# Patient Record
Sex: Female | Born: 1960
Health system: Southern US, Community
[De-identification: ages and names within clinical notes are randomized; demographics above are authoritative.]

## PROBLEM LIST (undated history)

## (undated) DIAGNOSIS — E7800 Pure hypercholesterolemia, unspecified: Secondary | ICD-10-CM

---

## 2003-05-23 ENCOUNTER — Other Ambulatory Visit: Admission: RE | Admit: 2003-05-23 | Discharge: 2003-05-23 | Payer: Self-pay | Admitting: Family Medicine

## 2005-08-11 ENCOUNTER — Other Ambulatory Visit: Admission: RE | Admit: 2005-08-11 | Discharge: 2005-08-11 | Payer: Self-pay | Admitting: Family Medicine

## 2006-04-22 ENCOUNTER — Ambulatory Visit (HOSPITAL_COMMUNITY): Admission: RE | Admit: 2006-04-22 | Discharge: 2006-04-22 | Payer: Self-pay | Admitting: Family Medicine

## 2006-05-05 ENCOUNTER — Encounter: Admission: RE | Admit: 2006-05-05 | Discharge: 2006-05-05 | Payer: Self-pay | Admitting: Family Medicine

## 2006-05-21 ENCOUNTER — Other Ambulatory Visit: Admission: RE | Admit: 2006-05-21 | Discharge: 2006-05-21 | Payer: Self-pay | Admitting: Family Medicine

## 2006-08-20 ENCOUNTER — Ambulatory Visit (HOSPITAL_COMMUNITY): Admission: RE | Admit: 2006-08-20 | Discharge: 2006-08-20 | Payer: Self-pay | Admitting: Endocrinology

## 2006-11-22 ENCOUNTER — Other Ambulatory Visit: Admission: RE | Admit: 2006-11-22 | Discharge: 2006-11-22 | Payer: Self-pay | Admitting: Family Medicine

## 2007-05-06 ENCOUNTER — Other Ambulatory Visit: Admission: RE | Admit: 2007-05-06 | Discharge: 2007-05-06 | Payer: Self-pay | Admitting: Family Medicine

## 2007-06-08 ENCOUNTER — Encounter: Admission: RE | Admit: 2007-06-08 | Discharge: 2007-06-08 | Payer: Self-pay | Admitting: Family Medicine

## 2007-11-21 ENCOUNTER — Other Ambulatory Visit: Admission: RE | Admit: 2007-11-21 | Discharge: 2007-11-21 | Payer: Self-pay | Admitting: Family Medicine

## 2008-06-25 ENCOUNTER — Other Ambulatory Visit: Admission: RE | Admit: 2008-06-25 | Discharge: 2008-06-25 | Payer: Self-pay | Admitting: Obstetrics and Gynecology

## 2008-08-01 ENCOUNTER — Encounter: Admission: RE | Admit: 2008-08-01 | Discharge: 2008-08-01 | Payer: Self-pay | Admitting: Family Medicine

## 2008-12-26 ENCOUNTER — Other Ambulatory Visit: Admission: RE | Admit: 2008-12-26 | Discharge: 2008-12-26 | Payer: Self-pay | Admitting: Obstetrics and Gynecology

## 2009-03-12 ENCOUNTER — Encounter: Admission: RE | Admit: 2009-03-12 | Discharge: 2009-03-12 | Payer: Self-pay | Admitting: Family Medicine

## 2010-12-01 ENCOUNTER — Encounter: Payer: Self-pay | Admitting: Family Medicine

## 2011-08-12 ENCOUNTER — Other Ambulatory Visit: Payer: Self-pay | Admitting: Family Medicine

## 2011-08-12 DIAGNOSIS — Z1231 Encounter for screening mammogram for malignant neoplasm of breast: Secondary | ICD-10-CM

## 2011-08-26 ENCOUNTER — Ambulatory Visit: Payer: Self-pay

## 2011-08-28 ENCOUNTER — Ambulatory Visit
Admission: RE | Admit: 2011-08-28 | Discharge: 2011-08-28 | Disposition: A | Discharge status: Home / Self Care | Payer: BC Managed Care – PPO | Source: Ambulatory Visit | Attending: Family Medicine | Admitting: Family Medicine

## 2011-08-28 DIAGNOSIS — Z1231 Encounter for screening mammogram for malignant neoplasm of breast: Secondary | ICD-10-CM

## 2011-09-02 ENCOUNTER — Other Ambulatory Visit: Payer: Self-pay | Admitting: Family Medicine

## 2011-09-02 DIAGNOSIS — R928 Other abnormal and inconclusive findings on diagnostic imaging of breast: Secondary | ICD-10-CM

## 2011-09-16 ENCOUNTER — Ambulatory Visit
Admission: RE | Admit: 2011-09-16 | Discharge: 2011-09-16 | Disposition: A | Discharge status: Home / Self Care | Payer: BC Managed Care – PPO | Source: Ambulatory Visit | Attending: Family Medicine | Admitting: Family Medicine

## 2011-09-16 ENCOUNTER — Other Ambulatory Visit: Payer: Self-pay | Admitting: Obstetrics and Gynecology

## 2011-09-16 DIAGNOSIS — R928 Other abnormal and inconclusive findings on diagnostic imaging of breast: Secondary | ICD-10-CM

## 2011-09-16 DIAGNOSIS — R921 Mammographic calcification found on diagnostic imaging of breast: Secondary | ICD-10-CM

## 2011-09-21 ENCOUNTER — Other Ambulatory Visit: Payer: Self-pay | Admitting: Obstetrics and Gynecology

## 2011-09-21 ENCOUNTER — Other Ambulatory Visit (HOSPITAL_COMMUNITY)
Admission: RE | Admit: 2011-09-21 | Discharge: 2011-09-21 | Disposition: A | Discharge status: Home / Self Care | Payer: BC Managed Care – PPO | Source: Ambulatory Visit | Attending: Obstetrics and Gynecology | Admitting: Obstetrics and Gynecology

## 2011-09-21 DIAGNOSIS — Z01419 Encounter for gynecological examination (general) (routine) without abnormal findings: Secondary | ICD-10-CM | POA: Insufficient documentation

## 2012-03-14 ENCOUNTER — Ambulatory Visit
Admission: RE | Admit: 2012-03-14 | Discharge: 2012-03-14 | Disposition: A | Discharge status: Home / Self Care | Payer: BC Managed Care – PPO | Source: Ambulatory Visit | Attending: Obstetrics and Gynecology | Admitting: Obstetrics and Gynecology

## 2012-03-14 DIAGNOSIS — R921 Mammographic calcification found on diagnostic imaging of breast: Secondary | ICD-10-CM

## 2012-09-21 ENCOUNTER — Other Ambulatory Visit: Payer: Self-pay | Admitting: Obstetrics and Gynecology

## 2012-09-21 ENCOUNTER — Other Ambulatory Visit (HOSPITAL_COMMUNITY)
Admission: RE | Admit: 2012-09-21 | Discharge: 2012-09-21 | Disposition: A | Discharge status: Home / Self Care | Payer: BC Managed Care – PPO | Source: Ambulatory Visit | Attending: Obstetrics and Gynecology | Admitting: Obstetrics and Gynecology

## 2012-09-21 DIAGNOSIS — Z01419 Encounter for gynecological examination (general) (routine) without abnormal findings: Secondary | ICD-10-CM | POA: Insufficient documentation

## 2013-09-25 ENCOUNTER — Other Ambulatory Visit (HOSPITAL_COMMUNITY)
Admission: RE | Admit: 2013-09-25 | Discharge: 2013-09-25 | Disposition: A | Discharge status: Home / Self Care | Payer: BC Managed Care – PPO | Source: Ambulatory Visit | Attending: Obstetrics & Gynecology | Admitting: Obstetrics & Gynecology

## 2013-09-25 ENCOUNTER — Other Ambulatory Visit: Payer: Self-pay | Admitting: Obstetrics & Gynecology

## 2013-09-25 DIAGNOSIS — Z1151 Encounter for screening for human papillomavirus (HPV): Secondary | ICD-10-CM | POA: Insufficient documentation

## 2013-09-25 DIAGNOSIS — Z01419 Encounter for gynecological examination (general) (routine) without abnormal findings: Secondary | ICD-10-CM | POA: Insufficient documentation

## 2013-11-22 ENCOUNTER — Other Ambulatory Visit: Payer: Self-pay

## 2013-11-22 DIAGNOSIS — Z1231 Encounter for screening mammogram for malignant neoplasm of breast: Secondary | ICD-10-CM

## 2013-12-12 ENCOUNTER — Ambulatory Visit: Payer: BC Managed Care – PPO

## 2014-09-27 ENCOUNTER — Ambulatory Visit
Admission: RE | Admit: 2014-09-27 | Discharge: 2014-09-27 | Disposition: A | Discharge status: Home / Self Care | Payer: BC Managed Care – PPO | Source: Ambulatory Visit

## 2014-09-27 ENCOUNTER — Encounter (INDEPENDENT_AMBULATORY_CARE_PROVIDER_SITE_OTHER): Payer: Self-pay

## 2014-09-27 DIAGNOSIS — Z1231 Encounter for screening mammogram for malignant neoplasm of breast: Secondary | ICD-10-CM

## 2016-02-19 DIAGNOSIS — F331 Major depressive disorder, recurrent, moderate: Secondary | ICD-10-CM | POA: Diagnosis not present

## 2016-02-26 DIAGNOSIS — F331 Major depressive disorder, recurrent, moderate: Secondary | ICD-10-CM | POA: Diagnosis not present

## 2016-05-14 DIAGNOSIS — D225 Melanocytic nevi of trunk: Secondary | ICD-10-CM | POA: Diagnosis not present

## 2016-07-22 DIAGNOSIS — E785 Hyperlipidemia, unspecified: Secondary | ICD-10-CM | POA: Diagnosis not present

## 2016-07-22 DIAGNOSIS — M545 Low back pain: Secondary | ICD-10-CM | POA: Diagnosis not present

## 2016-07-22 DIAGNOSIS — E282 Polycystic ovarian syndrome: Secondary | ICD-10-CM | POA: Diagnosis not present

## 2016-07-22 DIAGNOSIS — E559 Vitamin D deficiency, unspecified: Secondary | ICD-10-CM | POA: Diagnosis not present

## 2016-07-22 DIAGNOSIS — J3489 Other specified disorders of nose and nasal sinuses: Secondary | ICD-10-CM | POA: Diagnosis not present

## 2016-07-22 DIAGNOSIS — R946 Abnormal results of thyroid function studies: Secondary | ICD-10-CM | POA: Diagnosis not present

## 2016-07-22 DIAGNOSIS — R197 Diarrhea, unspecified: Secondary | ICD-10-CM | POA: Diagnosis not present

## 2016-08-10 DIAGNOSIS — F331 Major depressive disorder, recurrent, moderate: Secondary | ICD-10-CM | POA: Diagnosis not present

## 2016-08-25 DIAGNOSIS — F331 Major depressive disorder, recurrent, moderate: Secondary | ICD-10-CM | POA: Diagnosis not present

## 2016-09-15 DIAGNOSIS — F331 Major depressive disorder, recurrent, moderate: Secondary | ICD-10-CM | POA: Diagnosis not present

## 2016-09-17 DIAGNOSIS — Z79899 Other long term (current) drug therapy: Secondary | ICD-10-CM | POA: Diagnosis not present

## 2016-09-17 DIAGNOSIS — E785 Hyperlipidemia, unspecified: Secondary | ICD-10-CM | POA: Diagnosis not present

## 2016-10-13 DIAGNOSIS — F331 Major depressive disorder, recurrent, moderate: Secondary | ICD-10-CM | POA: Diagnosis not present

## 2016-10-30 DIAGNOSIS — R946 Abnormal results of thyroid function studies: Secondary | ICD-10-CM | POA: Diagnosis not present

## 2016-12-17 DIAGNOSIS — E785 Hyperlipidemia, unspecified: Secondary | ICD-10-CM | POA: Diagnosis not present

## 2016-12-17 DIAGNOSIS — Z79899 Other long term (current) drug therapy: Secondary | ICD-10-CM | POA: Diagnosis not present

## 2017-01-28 DIAGNOSIS — R946 Abnormal results of thyroid function studies: Secondary | ICD-10-CM | POA: Diagnosis not present

## 2017-05-18 DIAGNOSIS — L821 Other seborrheic keratosis: Secondary | ICD-10-CM | POA: Diagnosis not present

## 2017-05-26 ENCOUNTER — Other Ambulatory Visit: Payer: Self-pay | Admitting: Family Medicine

## 2017-05-26 DIAGNOSIS — Z1231 Encounter for screening mammogram for malignant neoplasm of breast: Secondary | ICD-10-CM

## 2017-05-26 DIAGNOSIS — R5381 Other malaise: Secondary | ICD-10-CM

## 2017-05-27 ENCOUNTER — Other Ambulatory Visit: Payer: Self-pay | Admitting: Family Medicine

## 2017-05-27 DIAGNOSIS — N63 Unspecified lump in unspecified breast: Secondary | ICD-10-CM

## 2017-05-27 DIAGNOSIS — R2232 Localized swelling, mass and lump, left upper limb: Secondary | ICD-10-CM | POA: Diagnosis not present

## 2017-05-28 ENCOUNTER — Ambulatory Visit
Admission: RE | Admit: 2017-05-28 | Discharge: 2017-05-28 | Disposition: A | Discharge status: Home / Self Care | Payer: BLUE CROSS/BLUE SHIELD | Source: Ambulatory Visit | Attending: Family Medicine | Admitting: Family Medicine

## 2017-05-28 ENCOUNTER — Ambulatory Visit: Payer: Self-pay

## 2017-05-28 DIAGNOSIS — N6489 Other specified disorders of breast: Secondary | ICD-10-CM | POA: Diagnosis not present

## 2017-05-28 DIAGNOSIS — N63 Unspecified lump in unspecified breast: Secondary | ICD-10-CM

## 2017-05-28 DIAGNOSIS — R928 Other abnormal and inconclusive findings on diagnostic imaging of breast: Secondary | ICD-10-CM | POA: Diagnosis not present

## 2017-05-31 ENCOUNTER — Other Ambulatory Visit: Payer: Self-pay | Admitting: Family Medicine

## 2017-05-31 DIAGNOSIS — N63 Unspecified lump in unspecified breast: Secondary | ICD-10-CM

## 2017-08-02 DIAGNOSIS — R946 Abnormal results of thyroid function studies: Secondary | ICD-10-CM | POA: Diagnosis not present

## 2018-02-05 DIAGNOSIS — H6503 Acute serous otitis media, bilateral: Secondary | ICD-10-CM | POA: Diagnosis not present

## 2018-02-05 DIAGNOSIS — H6981 Other specified disorders of Eustachian tube, right ear: Secondary | ICD-10-CM | POA: Diagnosis not present

## 2018-02-17 DIAGNOSIS — H9201 Otalgia, right ear: Secondary | ICD-10-CM | POA: Diagnosis not present

## 2018-03-31 DIAGNOSIS — H524 Presbyopia: Secondary | ICD-10-CM | POA: Diagnosis not present

## 2018-06-01 DIAGNOSIS — R599 Enlarged lymph nodes, unspecified: Secondary | ICD-10-CM | POA: Diagnosis not present

## 2018-06-01 DIAGNOSIS — J309 Allergic rhinitis, unspecified: Secondary | ICD-10-CM | POA: Diagnosis not present

## 2018-06-01 DIAGNOSIS — E785 Hyperlipidemia, unspecified: Secondary | ICD-10-CM | POA: Diagnosis not present

## 2018-07-05 DIAGNOSIS — R946 Abnormal results of thyroid function studies: Secondary | ICD-10-CM | POA: Diagnosis not present

## 2018-07-05 DIAGNOSIS — E785 Hyperlipidemia, unspecified: Secondary | ICD-10-CM | POA: Diagnosis not present

## 2018-08-05 ENCOUNTER — Other Ambulatory Visit: Payer: Self-pay | Admitting: Family Medicine

## 2018-08-05 DIAGNOSIS — Z1231 Encounter for screening mammogram for malignant neoplasm of breast: Secondary | ICD-10-CM

## 2018-09-08 ENCOUNTER — Ambulatory Visit
Admission: RE | Admit: 2018-09-08 | Discharge: 2018-09-08 | Disposition: A | Discharge status: Home / Self Care | Payer: BLUE CROSS/BLUE SHIELD | Source: Ambulatory Visit | Attending: Family Medicine | Admitting: Family Medicine

## 2018-09-08 DIAGNOSIS — Z1231 Encounter for screening mammogram for malignant neoplasm of breast: Secondary | ICD-10-CM | POA: Diagnosis not present

## 2018-09-16 DIAGNOSIS — R221 Localized swelling, mass and lump, neck: Secondary | ICD-10-CM | POA: Diagnosis not present

## 2018-09-27 DIAGNOSIS — J040 Acute laryngitis: Secondary | ICD-10-CM | POA: Diagnosis not present

## 2018-09-27 DIAGNOSIS — J320 Chronic maxillary sinusitis: Secondary | ICD-10-CM | POA: Diagnosis not present

## 2018-09-27 DIAGNOSIS — K210 Gastro-esophageal reflux disease with esophagitis: Secondary | ICD-10-CM | POA: Diagnosis not present

## 2018-10-17 DIAGNOSIS — J320 Chronic maxillary sinusitis: Secondary | ICD-10-CM | POA: Diagnosis not present

## 2018-10-17 DIAGNOSIS — J322 Chronic ethmoidal sinusitis: Secondary | ICD-10-CM | POA: Diagnosis not present

## 2018-10-17 DIAGNOSIS — J040 Acute laryngitis: Secondary | ICD-10-CM | POA: Diagnosis not present

## 2019-04-04 DIAGNOSIS — E559 Vitamin D deficiency, unspecified: Secondary | ICD-10-CM | POA: Diagnosis not present

## 2019-04-04 DIAGNOSIS — R946 Abnormal results of thyroid function studies: Secondary | ICD-10-CM | POA: Diagnosis not present

## 2019-04-04 DIAGNOSIS — R7303 Prediabetes: Secondary | ICD-10-CM | POA: Diagnosis not present

## 2019-04-04 DIAGNOSIS — E785 Hyperlipidemia, unspecified: Secondary | ICD-10-CM | POA: Diagnosis not present

## 2019-05-16 DIAGNOSIS — R946 Abnormal results of thyroid function studies: Secondary | ICD-10-CM | POA: Diagnosis not present

## 2019-05-16 DIAGNOSIS — E559 Vitamin D deficiency, unspecified: Secondary | ICD-10-CM | POA: Diagnosis not present

## 2019-05-16 DIAGNOSIS — E785 Hyperlipidemia, unspecified: Secondary | ICD-10-CM | POA: Diagnosis not present

## 2019-05-16 DIAGNOSIS — R7303 Prediabetes: Secondary | ICD-10-CM | POA: Diagnosis not present

## 2019-09-06 DIAGNOSIS — M62838 Other muscle spasm: Secondary | ICD-10-CM | POA: Diagnosis not present

## 2019-12-12 DIAGNOSIS — Z20822 Contact with and (suspected) exposure to covid-19: Secondary | ICD-10-CM | POA: Diagnosis not present

## 2020-07-12 IMAGING — MG DIGITAL SCREENING BILATERAL MAMMOGRAM WITH CAD
4 series · 4 of 4 positions shown · non-contrast
Comparison: Previous exam(s).

CLINICAL DATA: Screening.

EXAM:
DIGITAL SCREENING BILATERAL MAMMOGRAM WITH CAD

[R MLO]
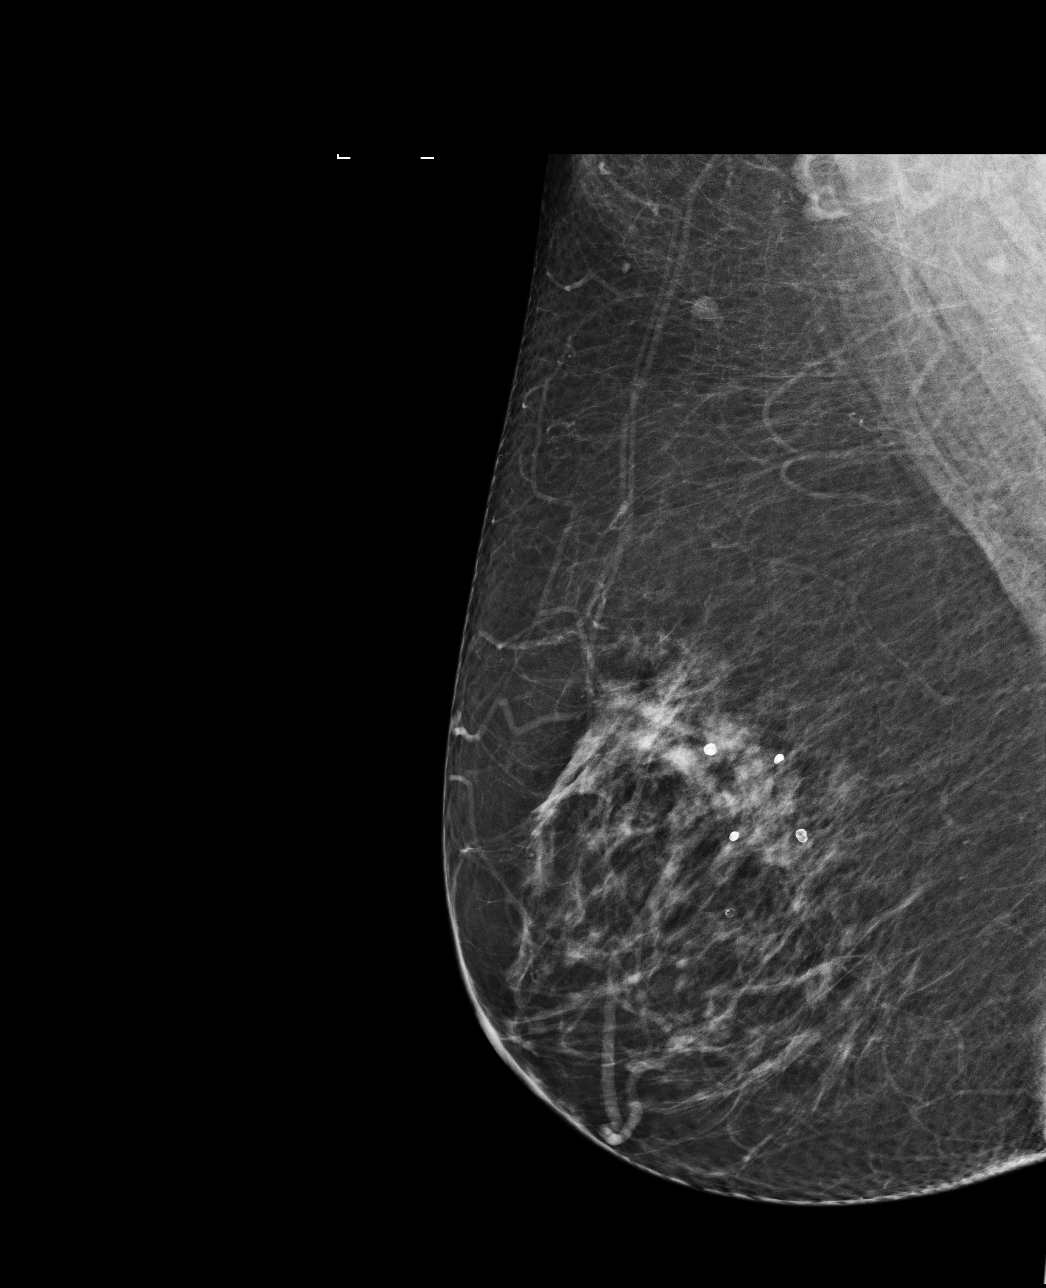

[R CC]
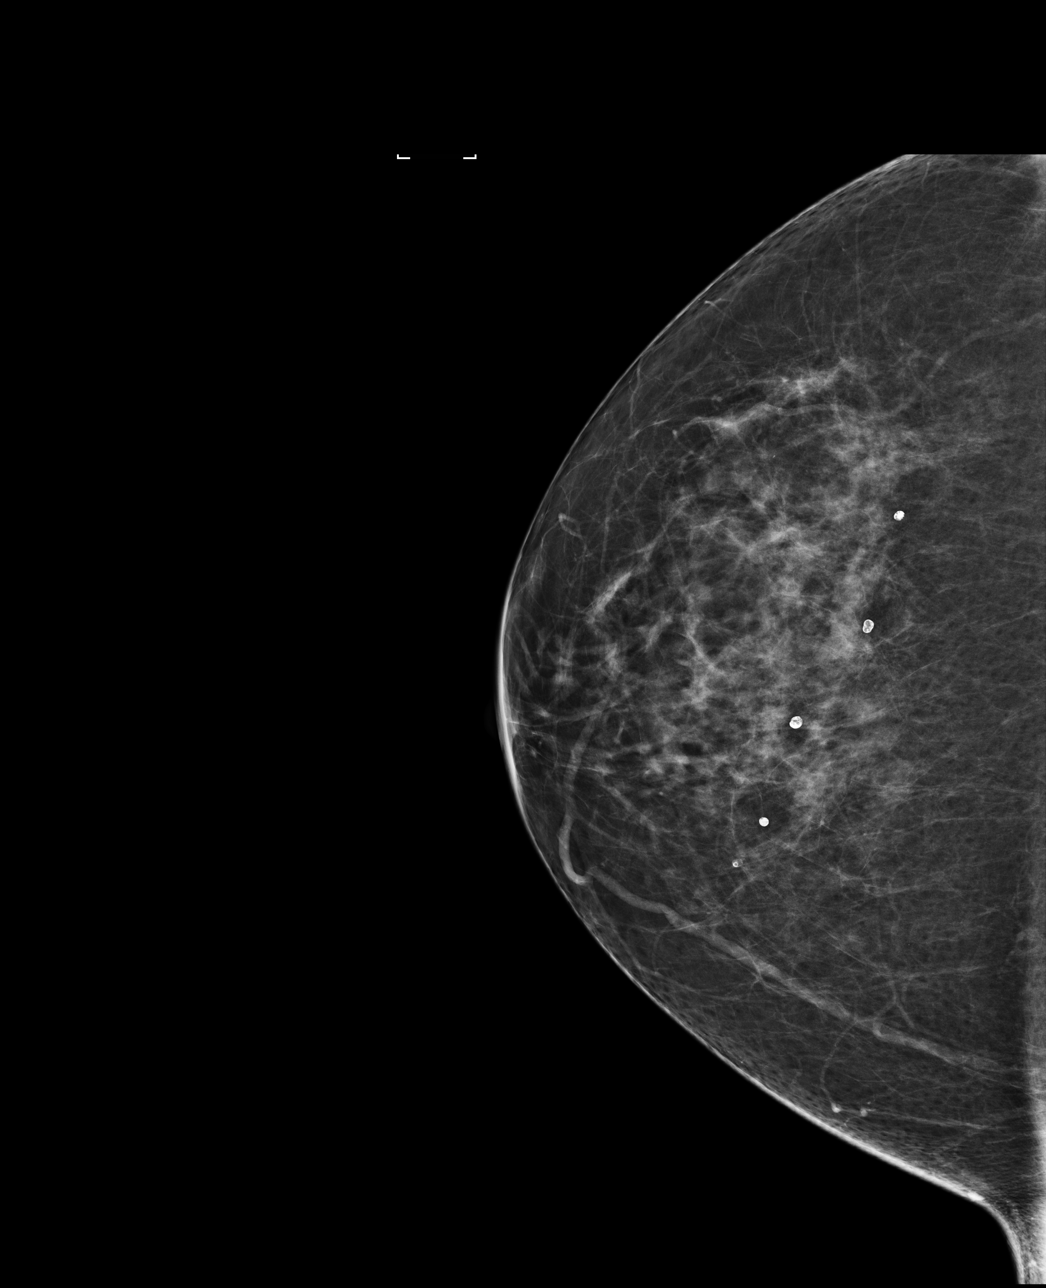

[L CC]
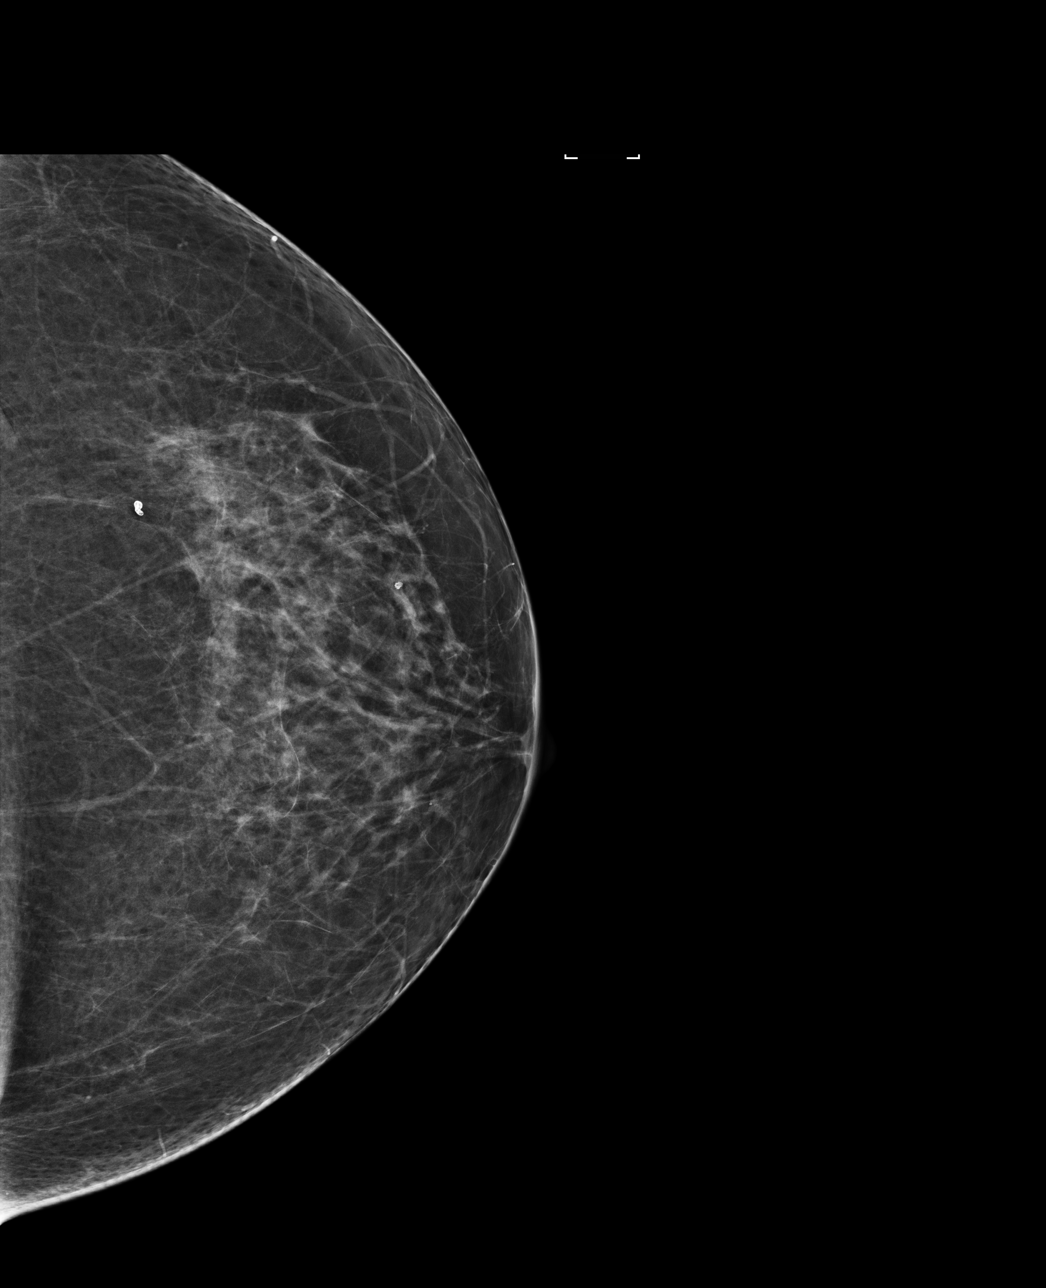

[L MLO]
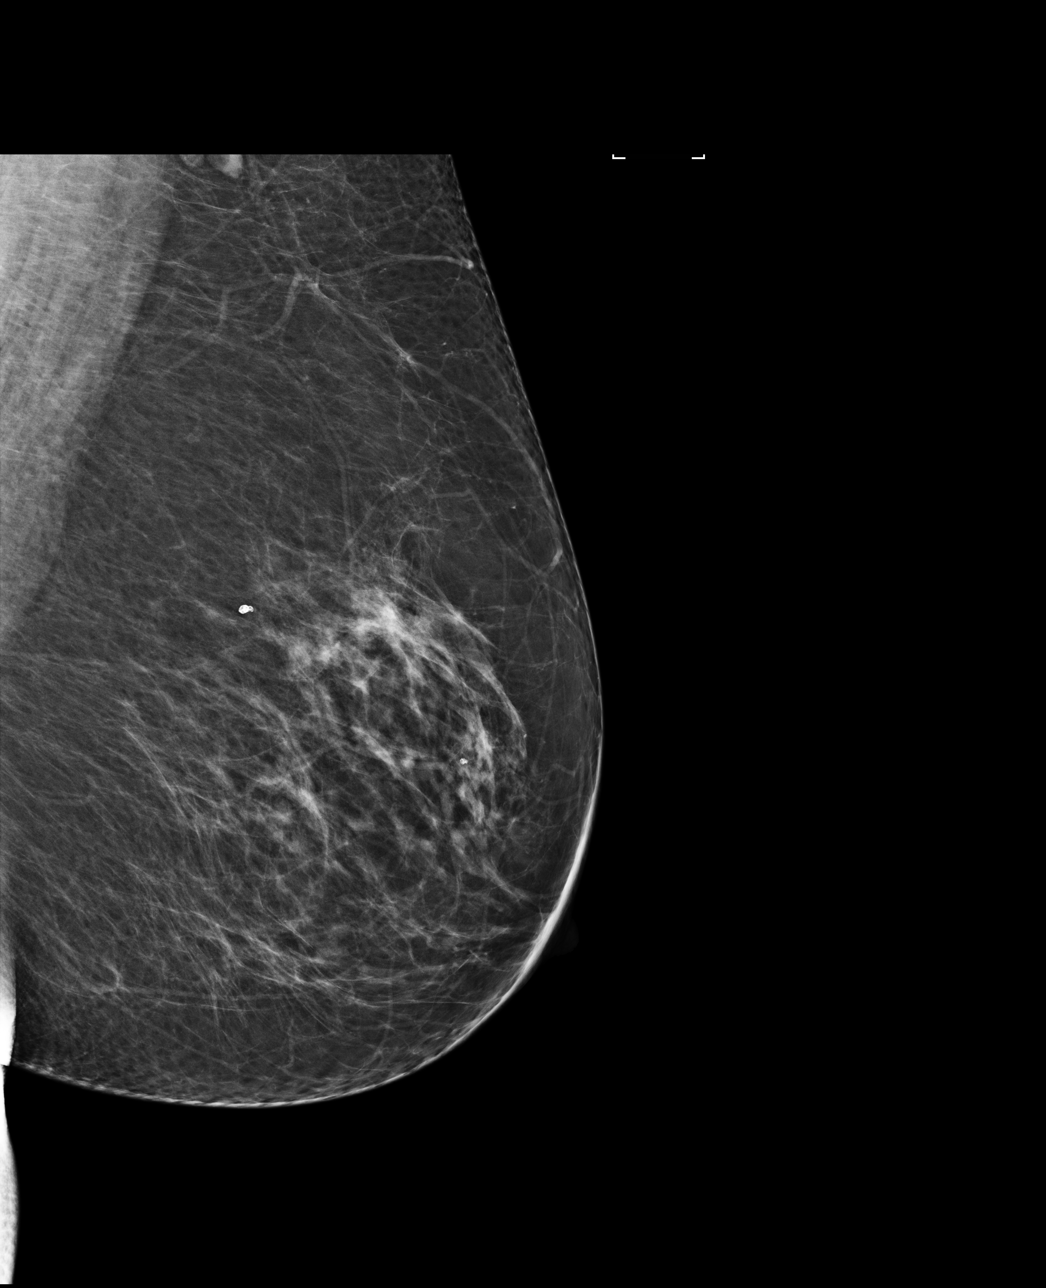

[4 of 4 positions shown; findings below may reference images not displayed]

ACR Breast Density Category c: The breast tissue is heterogeneously
dense, which may obscure small masses.
FINDINGS: There are no findings suspicious for malignancy. Images were
processed with CAD.
IMPRESSION: No mammographic evidence of malignancy. A result letter of this
screening mammogram will be mailed directly to the patient.

RECOMMENDATION:
Screening mammogram in one year. (Code:YJ-[id number])

BI-RADS CATEGORY  1: Negative.

## 2020-12-16 ENCOUNTER — Emergency Department (HOSPITAL_BASED_OUTPATIENT_CLINIC_OR_DEPARTMENT_OTHER)
Admission: EM | Admit: 2020-12-16 | Discharge: 2020-12-16 | Disposition: A | Discharge status: Home / Self Care | Payer: BC Managed Care – PPO | Attending: Emergency Medicine | Admitting: Emergency Medicine

## 2020-12-16 ENCOUNTER — Emergency Department (HOSPITAL_BASED_OUTPATIENT_CLINIC_OR_DEPARTMENT_OTHER): Payer: BC Managed Care – PPO

## 2020-12-16 ENCOUNTER — Other Ambulatory Visit: Payer: Self-pay

## 2020-12-16 ENCOUNTER — Other Ambulatory Visit (HOSPITAL_BASED_OUTPATIENT_CLINIC_OR_DEPARTMENT_OTHER): Payer: Self-pay | Admitting: Emergency Medicine

## 2020-12-16 ENCOUNTER — Encounter (HOSPITAL_BASED_OUTPATIENT_CLINIC_OR_DEPARTMENT_OTHER): Payer: Self-pay

## 2020-12-16 DIAGNOSIS — R0602 Shortness of breath: Secondary | ICD-10-CM | POA: Diagnosis not present

## 2020-12-16 DIAGNOSIS — R519 Headache, unspecified: Secondary | ICD-10-CM | POA: Diagnosis not present

## 2020-12-16 DIAGNOSIS — S0990XA Unspecified injury of head, initial encounter: Secondary | ICD-10-CM | POA: Diagnosis not present

## 2020-12-16 DIAGNOSIS — R55 Syncope and collapse: Secondary | ICD-10-CM

## 2020-12-16 DIAGNOSIS — Y92000 Kitchen of unspecified non-institutional (private) residence as  the place of occurrence of the external cause: Secondary | ICD-10-CM | POA: Diagnosis not present

## 2020-12-16 DIAGNOSIS — R000 Tachycardia, unspecified: Secondary | ICD-10-CM | POA: Insufficient documentation

## 2020-12-16 DIAGNOSIS — R112 Nausea with vomiting, unspecified: Secondary | ICD-10-CM | POA: Diagnosis not present

## 2020-12-16 DIAGNOSIS — S060X1A Concussion with loss of consciousness of 30 minutes or less, initial encounter: Secondary | ICD-10-CM | POA: Diagnosis not present

## 2020-12-16 DIAGNOSIS — R7989 Other specified abnormal findings of blood chemistry: Secondary | ICD-10-CM | POA: Diagnosis not present

## 2020-12-16 DIAGNOSIS — R1110 Vomiting, unspecified: Secondary | ICD-10-CM | POA: Diagnosis not present

## 2020-12-16 DIAGNOSIS — R110 Nausea: Secondary | ICD-10-CM | POA: Diagnosis not present

## 2020-12-16 DIAGNOSIS — R002 Palpitations: Secondary | ICD-10-CM | POA: Diagnosis not present

## 2020-12-16 DIAGNOSIS — W01198A Fall on same level from slipping, tripping and stumbling with subsequent striking against other object, initial encounter: Secondary | ICD-10-CM | POA: Diagnosis not present

## 2020-12-16 DIAGNOSIS — I951 Orthostatic hypotension: Secondary | ICD-10-CM | POA: Diagnosis not present

## 2020-12-16 DIAGNOSIS — R42 Dizziness and giddiness: Secondary | ICD-10-CM | POA: Diagnosis not present

## 2020-12-16 LAB — CBC WITH DIFFERENTIAL/PLATELET
Abs Immature Granulocytes: 0.08 10*3/uL — ABNORMAL HIGH (ref 0.00–0.07)
Basophils Absolute: 0 10*3/uL (ref 0.0–0.1)
Basophils Relative: 0 %
Eosinophils Absolute: 0 10*3/uL (ref 0.0–0.5)
Eosinophils Relative: 0 %
HCT: 44.6 % (ref 36.0–46.0)
Hemoglobin: 14.8 g/dL (ref 12.0–15.0)
Immature Granulocytes: 1 %
Lymphocytes Relative: 8 %
Lymphs Abs: 1.1 10*3/uL (ref 0.7–4.0)
MCH: 28.6 pg (ref 26.0–34.0)
MCHC: 33.2 g/dL (ref 30.0–36.0)
MCV: 86.1 fL (ref 80.0–100.0)
Monocytes Absolute: 0.7 10*3/uL (ref 0.1–1.0)
Monocytes Relative: 5 %
Neutro Abs: 11.5 10*3/uL — ABNORMAL HIGH (ref 1.7–7.7)
Neutrophils Relative %: 86 %
Platelets: 297 10*3/uL (ref 150–400)
RBC: 5.18 MIL/uL — ABNORMAL HIGH (ref 3.87–5.11)
RDW: 12.6 % (ref 11.5–15.5)
WBC: 13.4 10*3/uL — ABNORMAL HIGH (ref 4.0–10.5)
nRBC: 0 % (ref 0.0–0.2)

## 2020-12-16 LAB — D-DIMER, QUANTITATIVE: D-Dimer, Quant: 2.13 ug/mL-FEU — ABNORMAL HIGH (ref 0.00–0.50)

## 2020-12-16 LAB — BASIC METABOLIC PANEL
Anion gap: 11 (ref 5–15)
BUN: 14 mg/dL (ref 6–20)
CO2: 27 mmol/L (ref 22–32)
Calcium: 8.9 mg/dL (ref 8.9–10.3)
Chloride: 102 mmol/L (ref 98–111)
Creatinine, Ser: 0.75 mg/dL (ref 0.44–1.00)
GFR, Estimated: >60 mL/min (ref >60)
Glucose, Bld: 138 mg/dL — ABNORMAL HIGH (ref 70–99)
Potassium: 4.2 mmol/L (ref 3.5–5.1)
Sodium: 140 mmol/L (ref 135–145)

## 2020-12-16 LAB — TROPONIN I (HIGH SENSITIVITY): Troponin I (High Sensitivity): 12 ng/L (ref <18)

## 2020-12-16 LAB — TSH: TSH: 3.913 u[IU]/mL (ref 0.350–4.500)

## 2020-12-16 MED ORDER — ONDANSETRON HCL 4 MG/2ML IJ SOLN
4.0000 mg | Freq: Once | INTRAMUSCULAR | Status: AC
  Administered 2020-12-16: 4 mg via INTRAVENOUS
  Filled 2020-12-16: qty 2

## 2020-12-16 MED ORDER — SODIUM CHLORIDE 0.9 % IV BOLUS
1000.0000 mL | Freq: Once | INTRAVENOUS | Status: AC
  Administered 2020-12-16: 1000 mL via INTRAVENOUS

## 2020-12-16 MED ORDER — MECLIZINE HCL 25 MG PO TABS
25.0000 mg | ORAL_TABLET | Freq: Once | ORAL | Status: AC
  Administered 2020-12-16: 25 mg via ORAL
  Filled 2020-12-16: qty 1

## 2020-12-16 MED ORDER — IOHEXOL 350 MG/ML SOLN
100.0000 mL | Freq: Once | INTRAVENOUS | Status: AC | PRN
  Administered 2020-12-16: 100 mL via INTRAVENOUS

## 2020-12-16 MED ORDER — ONDANSETRON 4 MG PO TBDP: 4.0000 mg | ORAL_TABLET | Freq: Three times a day (TID) | ORAL | 1 refills | Status: DC | PRN

## 2020-12-16 MED FILL — ONDANSETRON ODT 4 MG TABLET: 4 | 3 days supply | Qty: 10 | Fill #0

## 2020-12-16 NOTE — Discharge Instructions (Addendum)
Return for heart rate that is beating fast or abnormal that lasted 40 minutes or longer.  Take the Zofran as needed for the nausea.  Rest today.  Make an appointment follow-up with your regular doctor.

## 2020-12-16 NOTE — ED Triage Notes (Signed)
Patient states she was laying on the couch and got up to go to the kitchen, felt dizzy and passed out. Woke up in the kitchen floor. Reports pain to the back of her head, nausea and vomiting.

## 2020-12-16 NOTE — ED Notes (Signed)
Pt resting on stretcher states she is feeling better than she did when she came in.

## 2020-12-16 NOTE — ED Notes (Signed)
Pt assisted up to the bathroom. Tolerated well.

## 2020-12-16 NOTE — ED Provider Notes (Signed)
Patient feeling much better.  Patient states that she feels well enough to go home.  We will provide her with a prescription for Zofran.  She does not need a work note.  She will return for any new or worse symptoms.  Patient's CAT scans without any acute findings.   Vanetta Mulders, MD 12/16/20 [phone]

## 2020-12-16 NOTE — ED Provider Notes (Signed)
[facility] EMERGENCY DEPARTMENT Provider Note   CSN: [id number] Arrival date & time: 12/16/20  0315     History Chief Complaint  Patient presents with  . Loss of Consciousness    Joy Shelton is a 60 y.o. female.  HPI      This is a 60 year old female with no reported past medical history who presents with syncope and vomiting.  Patient reports that she was laying on the couch.  She fell asleep.  She got up around midnight to get to the kitchen.  She states that she felt warm and flushed.  No room spinning dizziness.  The next thing she knew she was on the floor.  She did hit her head.  She states she has a history of syncope in the past but is unsure the etiology.  No recent sicknesses or illnesses.  She states that while she was sitting on the couch she had some palpitations and "was not very aware of my breathing."  She states she felt like she could not get in a big breath.  No chest pain.  Since falling and hitting her head, she has multiple episodes of nonbilious, nonbloody emesis.  No neck pain.  She has been ambulatory.  History reviewed. No pertinent past medical history.  There are no problems to display for this patient.   History reviewed. No pertinent surgical history.   OB History   No obstetric history on file.     History reviewed. No pertinent family history.  Social History   Tobacco Use  . Smoking status: Never Smoker  . Smokeless tobacco: Never Used    Home Medications Prior to Admission medications   Not on File    Allergies    Patient has no known allergies.  Review of Systems   Review of Systems  Constitutional: Negative for fever.  Respiratory: Positive for shortness of breath.   Cardiovascular: Positive for palpitations. Negative for chest pain and leg swelling.  Gastrointestinal: Positive for nausea and vomiting. Negative for abdominal pain.  Genitourinary: Negative for dysuria.  Neurological: Positive for syncope.   All other systems reviewed and are negative.   Physical Exam Updated Vital Signs BP 137/67 (BP Location: Right Arm)   Pulse 97   Temp 98 F (36.7 C) (Oral)   Resp 14   Ht 1.575 m (5\' 2" )   Wt 81.6 kg   SpO2 94%   BMI 32.92 kg/m   Physical Exam Vitals and nursing note reviewed.  Constitutional:      Appearance: She is well-developed and well-nourished. She is not ill-appearing.     Comments: ABCs intact  HENT:     Head: Normocephalic.     Comments: Tenderness to palpation posterior scalp, no bogginess, hematoma noted    Mouth/Throat:     Mouth: Mucous membranes are moist.  Eyes:     Pupils: Pupils are equal, round, and reactive to light.  Cardiovascular:     Rate and Rhythm: Regular rhythm. Tachycardia present.     Heart sounds: Normal heart sounds.  Pulmonary:     Effort: Pulmonary effort is normal. No respiratory distress.     Breath sounds: No wheezing.  Abdominal:     General: Bowel sounds are normal.     Palpations: Abdomen is soft.     Tenderness: There is no abdominal tenderness.  Musculoskeletal:     Cervical back: Neck supple.     Right lower leg: No edema.     Left lower  leg: No edema.  Skin:    General: Skin is warm and dry.  Neurological:     Mental Status: She is alert and oriented to person, place, and time.     Comments: Cranial nerves II through XII intact, 5 out of 5 strength in all 4 extremities, no dysmetria to finger-nose-finger  Psychiatric:        Mood and Affect: Mood and affect and mood normal.     ED Results / Procedures / Treatments   Labs (all labs ordered are listed, but only abnormal results are displayed) Labs Reviewed  CBC WITH DIFFERENTIAL/PLATELET - Abnormal; Notable for the following components:      Result Value   WBC 13.4 (*)    RBC 5.18 (*)    Neutro Abs 11.5 (*)    Abs Immature Granulocytes 0.08 (*)    All other components within normal limits  BASIC METABOLIC PANEL - Abnormal; Notable for the following components:    Glucose, Bld 138 (*)    All other components within normal limits  D-DIMER, QUANTITATIVE (NOT AT [facility]) - Abnormal; Notable for the following components:   D-Dimer, Quant 2.13 (*)    All other components within normal limits  TSH  TROPONIN I (HIGH SENSITIVITY)    EKG None  Radiology DG Chest 2 View  Result Date: 12/16/2020 CLINICAL DATA:  60 year old female status post syncope and fall in kitchen. Head pain nausea and vomiting. EXAM: CHEST - 2 VIEW COMPARISON:  None. FINDINGS: Low normal lung volumes. Normal cardiac size and mediastinal contours. Visualized tracheal air column is within normal limits. Both lungs appear clear. No pneumothorax or pleural effusion. Normal visible bowel gas pattern. Chronic deformity medial left clavicle appears healed. No acute osseous abnormality identified. IMPRESSION: No acute cardiopulmonary abnormality or acute traumatic injury identified. Electronically Signed   By: Odessa Fleming M.D.   On: 12/16/2020 04:48   CT Head Wo Contrast  Result Date: 12/16/2020 CLINICAL DATA:  60 year old female status post syncope and fall in kitchen. Head pain nausea and vomiting. EXAM: CT HEAD WITHOUT CONTRAST TECHNIQUE: Contiguous axial images were obtained from the base of the skull through the vertex without intravenous contrast. COMPARISON:  None. FINDINGS: Brain: Cerebral volume is within normal limits for age. No midline shift, ventriculomegaly, mass effect, evidence of mass lesion, intracranial hemorrhage or evidence of cortically based acute infarction. Gray-white matter differentiation is within normal limits throughout the brain. No encephalomalacia identified. Vascular: Mild Calcified atherosclerosis at the skull base. No suspicious intracranial vascular hyperdensity. Skull: Intact. Sinuses/Orbits: Visualized paranasal sinuses and mastoids are clear. Tympanic cavities are clear. Other: Posterosuperior broad-based scalp hematoma up to 12 mm in thickness. Underlying calvarium  intact. No scalp soft tissue gas. Visualized orbit soft tissues are within normal limits. IMPRESSION: 1. Posterosuperior scalp hematoma without underlying skull fracture. 2. Normal for age non contrast CT appearance of the brain. Electronically Signed   By: Odessa Fleming M.D.   On: 12/16/2020 04:47   CT Angio Chest PE W and/or Wo Contrast  Result Date: 12/16/2020 CLINICAL DATA:  Shortness of breath and dizziness. Positive D-dimer. Concern for pulmonary embolus. EXAM: CT ANGIOGRAPHY CHEST WITH CONTRAST TECHNIQUE: Multidetector CT imaging of the chest was performed using the standard protocol during bolus administration of intravenous contrast. Multiplanar CT image reconstructions and MIPs were obtained to evaluate the vascular anatomy. CONTRAST:  OMNIPAQUE IOHEXOL 350 MG/ML SOLN COMPARISON:  None. FINDINGS: Cardiovascular: The heart size is normal. No substantial pericardial effusion. No thoracic aortic aneurysm.  There is no filling defect within the opacified pulmonary arteries to suggest the presence of an acute pulmonary embolus. Mediastinum/Nodes: No mediastinal lymphadenopathy. There is no hilar lymphadenopathy. The esophagus has normal imaging features. There is no axillary lymphadenopathy. Lungs/Pleura: No suspicious pulmonary nodule or mass. No focal airspace consolidation. No pleural effusion. Upper Abdomen: Unremarkable. Musculoskeletal: No worrisome lytic or sclerotic osseous abnormality. Review of the MIP images confirms the above findings. IMPRESSION: 1. No CT evidence for acute pulmonary embolus. 2. No acute findings in the chest. Electronically Signed   By: Kennith Center M.D.   On: 12/16/2020 06:25    Procedures Procedures   Medications Ordered in ED Medications  sodium chloride 0.9 % bolus 1,000 mL (0 mLs Intravenous Stopped 12/16/20 0655)  ondansetron (ZOFRAN) injection 4 mg (4 mg Intravenous Given 12/16/20 0426)  iohexol (OMNIPAQUE) 350 MG/ML injection 100 mL (100 mLs Intravenous Contrast  Given 12/16/20 0606)  sodium chloride 0.9 % bolus 1,000 mL (1,000 mLs Intravenous New Bag/Given 12/16/20 0654)    ED Course  I have reviewed the triage vital signs and the nursing notes.  Pertinent labs & imaging results that were available during my care of the patient were reviewed by me and considered in my medical decision making (see chart for details).  Clinical Course as of 12/16/20 0709  Mon Dec 16, 2020  [id number] Patient still dizzy with standing.  Will give an additional liter of fluids.  Discussed with the patient her work-up is reassuring.  Likely some component of orthostasis.  Given vomiting after the fall, she could also have a concussion which can be contributing to her dizziness as well.  She will be given concussion precautions. [CH]    Clinical Course User Index [CH] Imri Lor, Mayer Masker, MD   MDM Rules/Calculators/A&P                          Patient presents with episode of syncope.  She is overall nontoxic vital signs notable for mild tachycardia.  She had some prodrome.  Denies any chest pain but does report some palpitations and shortness of breath.  Considerations include vasovagal syncope, orthostasis, arrhythmia, PE.  Labs obtained.  Chest x-ray obtained shows no evidence of pneumothorax pneumonia.  EKG does not show any evidence of arrhythmia.  Patient had dizziness with standing.  She denies room spinning dizziness and her symptoms are not classic for vertigo.  Given her head trauma and vomiting, CT scan was obtained to rule out bleed.  CT scan is negative for acute bleed.  She has had some persistent nausea and dizziness.  Feel she may have a mild angulation.  Screening D-dimer was sent to the chest.  This was negative for PE.  See clinical exam above.  Angiulli of lupus, she remained dizzy with standing.  Will give a second liter of fluid.  Will provide with concussion precautions. Final Clinical Impression(s) / ED Diagnoses Final diagnoses:  Syncope, unspecified syncope  type  Orthostasis  Concussion with loss of consciousness of 30 minutes or less, initial encounter    Rx / DC Orders ED Discharge Orders    None       Zeynab Klett, Mayer Masker, MD 12/16/20 0710

## 2020-12-19 DIAGNOSIS — R7309 Other abnormal glucose: Secondary | ICD-10-CM | POA: Diagnosis not present

## 2020-12-19 DIAGNOSIS — R55 Syncope and collapse: Secondary | ICD-10-CM | POA: Diagnosis not present

## 2020-12-19 DIAGNOSIS — D72829 Elevated white blood cell count, unspecified: Secondary | ICD-10-CM | POA: Diagnosis not present

## 2021-04-05 ENCOUNTER — Other Ambulatory Visit: Payer: Self-pay

## 2021-04-05 ENCOUNTER — Emergency Department (HOSPITAL_BASED_OUTPATIENT_CLINIC_OR_DEPARTMENT_OTHER): Payer: BC Managed Care – PPO

## 2021-04-05 ENCOUNTER — Encounter (HOSPITAL_BASED_OUTPATIENT_CLINIC_OR_DEPARTMENT_OTHER): Payer: Self-pay | Admitting: Emergency Medicine

## 2021-04-05 ENCOUNTER — Emergency Department (HOSPITAL_BASED_OUTPATIENT_CLINIC_OR_DEPARTMENT_OTHER)
Admission: EM | Admit: 2021-04-05 | Discharge: 2021-04-05 | Disposition: A | Discharge status: Home / Self Care | Payer: BC Managed Care – PPO | Attending: Emergency Medicine | Admitting: Emergency Medicine

## 2021-04-05 DIAGNOSIS — R0789 Other chest pain: Secondary | ICD-10-CM | POA: Insufficient documentation

## 2021-04-05 DIAGNOSIS — R0600 Dyspnea, unspecified: Secondary | ICD-10-CM | POA: Diagnosis not present

## 2021-04-05 DIAGNOSIS — R079 Chest pain, unspecified: Secondary | ICD-10-CM | POA: Diagnosis not present

## 2021-04-05 LAB — BASIC METABOLIC PANEL
Anion gap: 8 (ref 5–15)
BUN: 18 mg/dL (ref 6–20)
CO2: 28 mmol/L (ref 22–32)
Calcium: 8.5 mg/dL — ABNORMAL LOW (ref 8.9–10.3)
Chloride: 102 mmol/L (ref 98–111)
Creatinine, Ser: 1.12 mg/dL — ABNORMAL HIGH (ref 0.44–1.00)
GFR, Estimated: 57 mL/min — ABNORMAL LOW (ref >60)
Glucose, Bld: 116 mg/dL — ABNORMAL HIGH (ref 70–99)
Potassium: 3.9 mmol/L (ref 3.5–5.1)
Sodium: 138 mmol/L (ref 135–145)

## 2021-04-05 LAB — D-DIMER, QUANTITATIVE: D-Dimer, Quant: 0.63 ug/mL-FEU — ABNORMAL HIGH (ref 0.00–0.50)

## 2021-04-05 LAB — CBC WITH DIFFERENTIAL/PLATELET
Abs Immature Granulocytes: 0.03 10*3/uL (ref 0.00–0.07)
Basophils Absolute: 0 10*3/uL (ref 0.0–0.1)
Basophils Relative: 0 %
Eosinophils Absolute: 0.2 10*3/uL (ref 0.0–0.5)
Eosinophils Relative: 2 %
HCT: 43.3 % (ref 36.0–46.0)
Hemoglobin: 14.3 g/dL (ref 12.0–15.0)
Immature Granulocytes: 0 %
Lymphocytes Relative: 30 %
Lymphs Abs: 2.4 10*3/uL (ref 0.7–4.0)
MCH: 28.4 pg (ref 26.0–34.0)
MCHC: 33 g/dL (ref 30.0–36.0)
MCV: 86.1 fL (ref 80.0–100.0)
Monocytes Absolute: 0.8 10*3/uL (ref 0.1–1.0)
Monocytes Relative: 9 %
Neutro Abs: 4.8 10*3/uL (ref 1.7–7.7)
Neutrophils Relative %: 59 %
Platelets: 304 10*3/uL (ref 150–400)
RBC: 5.03 MIL/uL (ref 3.87–5.11)
RDW: 13 % (ref 11.5–15.5)
WBC: 8.2 10*3/uL (ref 4.0–10.5)
nRBC: 0 % (ref 0.0–0.2)

## 2021-04-05 LAB — TROPONIN I (HIGH SENSITIVITY)
Troponin I (High Sensitivity): 2 ng/L (ref <18)
Troponin I (High Sensitivity): 3 ng/L

## 2021-04-05 MED ORDER — KETOROLAC TROMETHAMINE 30 MG/ML IJ SOLN
30.0000 mg | Freq: Once | INTRAMUSCULAR | Status: AC
  Administered 2021-04-05: 30 mg via INTRAVENOUS
  Filled 2021-04-05: qty 1

## 2021-04-05 NOTE — ED Triage Notes (Signed)
Pt states left upper rib pain around left breast. Does not radiate. Started at 9pm about 30 mins after dinner. Pain worse about 1 hour ago.

## 2021-04-05 NOTE — ED Provider Notes (Signed)
[facility] EMERGENCY DEPARTMENT Provider Note   CSN: [id number] Arrival date & time: 04/05/21  0335     History Chief Complaint  Patient presents with  . Chest Pain    Joy Shelton is a 60 y.o. female.  The history is provided by the patient.  Chest Pain She has history of hyperlipidemia and comes in because of left-sided chest pain which started at 9 PM.  Pain is sharp and located in the left inframammary area without radiation.  Is worse with a deep breath.  Pain is rated at 7/10.  There is slight associated dyspnea but no nausea or diaphoresis.  She denies any recent trauma and denies any cough or fever.  She has not taken anything for pain.  She denies any recent surgery or trauma.  She is not taking any exogenous estrogens.  She is a non-smoker and there is no history of hypertension or diabetes.  There is no family history of premature coronary atherosclerosis.   History reviewed. No pertinent past medical history.  There are no problems to display for this patient.   History reviewed. No pertinent surgical history.   OB History   No obstetric history on file.     History reviewed. No pertinent family history.  Social History   Tobacco Use  . Smoking status: Never Smoker  . Smokeless tobacco: Never Used    Home Medications Prior to Admission medications   Medication Sig Start Date End Date Taking? Authorizing Provider  ondansetron (ZOFRAN-ODT) 4 MG disintegrating tablet DISSOLVE 1 TABLET (4 MG TOTAL) BY MOUTH EVERY 8 (EIGHT) HOURS AS NEEDED. 12/16/20 12/16/21  Vanetta Mulders, MD    Allergies    Patient has no known allergies.  Review of Systems   Review of Systems  Cardiovascular: Positive for chest pain.  All other systems reviewed and are negative.   Physical Exam Updated Vital Signs BP 136/68 (BP Location: Left Arm)   Pulse 90   Temp 99 F (37.2 C) (Oral)   Resp 20   Ht 5\' 2"  (1.575 m)   Wt 85.6 kg   SpO2 99%   BMI 34.53 kg/m    Physical Exam Vitals and nursing note reviewed.   60 year old female, resting comfortably and in no acute distress. Vital signs are normal. Oxygen saturation is 99%, which is normal. Head is normocephalic and atraumatic. PERRLA, EOMI. Oropharynx is clear. Neck is nontender and supple without adenopathy or JVD. Back is nontender and there is no CVA tenderness. Lungs are clear without rales, wheezes, or rhonchi. Chest is tender in the left inframammary area and also in the mid axillary and anterior axillary line on the left.  There is no crepitus.46 Heart has regular rate and rhythm without murmur. Abdomen is soft, flat, nontender without masses or hepatosplenomegaly and peristalsis is normoactive. Extremities have no cyanosis or edema, full range of motion is present. Skin is warm and dry without rash. Neurologic: Mental status is normal, cranial nerves are intact, there are no motor or sensory deficits.  ED Results / Procedures / Treatments   Labs (all labs ordered are listed, but only abnormal results are displayed) Labs Reviewed  BASIC METABOLIC PANEL - Abnormal; Notable for the following components:      Result Value   Glucose, Bld 116 (*)    Creatinine, Ser 1.12 (*)    Calcium 8.5 (*)    GFR, Estimated 57 (*)    All other components within normal limits  D-DIMER, QUANTITATIVE -  Abnormal; Notable for the following components:   D-Dimer, Quant 0.63 (*)    All other components within normal limits  CBC WITH DIFFERENTIAL/PLATELET  TROPONIN I (HIGH SENSITIVITY)  TROPONIN I (HIGH SENSITIVITY)    EKG EKG Interpretation  Date/Time:  Saturday Apr 05 2021 03:51:09 EDT Ventricular Rate:  70 PR Interval:  159 QRS Duration: 80 QT Interval:  356 QTC Calculation: 385 R Axis:   18 Text Interpretation: Sinus rhythm Normal ECG No old tracing to compare Confirmed by Dione Booze ([id number]) on 04/05/2021 3:56:35 AM  Radiology DG Chest 2 View  Result Date: 04/05/2021 CLINICAL DATA:   Chest pain EXAM: CHEST - 2 VIEW COMPARISON:  12/16/2020 FINDINGS: Artifact from EKG leads. Low volume frontal chest with interstitial prominence at the bases that is likely interstitial crowding. No edema or focal pneumonia. No effusion or pneumothorax. Normal heart size and mediastinal contours. Artifact from EKG leads. IMPRESSION: Negative low volume chest. Electronically Signed   By: Marnee Spring M.D.   On: 04/05/2021 05:01    Procedures Procedures   Medications Ordered in ED Medications  ketorolac (TORADOL) 30 MG/ML injection 30 mg (has no administration in time range)    ED Course  I have reviewed the triage vital signs and the nursing notes.  Pertinent labs & imaging results that were available during my care of the patient were reviewed by me and considered in my medical decision making (see chart for details).   MDM Rules/Calculators/A&P                         Pleuritic left-sided chest pain of uncertain cause.  Physical exam is strongly suggestive of chest wall pain, but no history of trauma.  ECG is normal.  We will need to check troponin x2 and will need to check chest x-ray.  She is given a dose of ketorolac.  We will also screen for pulmonary embolism with D-dimer, although she has no classic risk factors for pulmonary embolism.  Patient risk score in the heart pathway is 2, which puts her at low risk for major adverse cardiac events in the next 6 weeks.  Old records are reviewed, and she does have a prior ED visit for syncope, but no other relevant past visits.  Troponin is normal.  Chest x-ray is unremarkable.  D-dimer is borderline elevated at 0.63.  Patient had excellent relief of pain with ketorolac.  Given her relative lack of risk factors, response to single dose of ketorolac, and negative CT angiogram in February when D-dimer was elevated to 2.13, I feel she does not need a CT angiogram today.  Currently waiting for repeat troponin.  Repeat troponin is also normal.   Patient is discharged with instructions to take over-the-counter NSAIDs.  Return precautions discussed.  Final Clinical Impression(s) / ED Diagnoses Final diagnoses:  Chest wall pain    Rx / DC Orders ED Discharge Orders    None       Dione Booze, MD 04/05/21 [phone]

## 2021-04-05 NOTE — Discharge Instructions (Addendum)
Apply ice for thirty minutes at a time, four times a day.  Take ibuprofen or naproxen as needed for pain. If you need additional pain relief, add acetaminophen.  Return if symptoms are getting worse.

## 2021-04-08 ENCOUNTER — Other Ambulatory Visit (HOSPITAL_COMMUNITY)
Admission: RE | Admit: 2021-04-08 | Discharge: 2021-04-08 | Disposition: A | Discharge status: Home / Self Care | Payer: BC Managed Care – PPO | Source: Ambulatory Visit | Attending: Family Medicine | Admitting: Family Medicine

## 2021-04-08 DIAGNOSIS — R946 Abnormal results of thyroid function studies: Secondary | ICD-10-CM | POA: Diagnosis not present

## 2021-04-08 DIAGNOSIS — E559 Vitamin D deficiency, unspecified: Secondary | ICD-10-CM | POA: Diagnosis not present

## 2021-04-08 DIAGNOSIS — Z0000 Encounter for general adult medical examination without abnormal findings: Secondary | ICD-10-CM | POA: Diagnosis not present

## 2021-04-08 DIAGNOSIS — R7303 Prediabetes: Secondary | ICD-10-CM | POA: Diagnosis not present

## 2021-04-08 DIAGNOSIS — Z124 Encounter for screening for malignant neoplasm of cervix: Secondary | ICD-10-CM | POA: Diagnosis not present

## 2021-04-08 DIAGNOSIS — E785 Hyperlipidemia, unspecified: Secondary | ICD-10-CM | POA: Diagnosis not present

## 2021-04-08 DIAGNOSIS — Z23 Encounter for immunization: Secondary | ICD-10-CM | POA: Diagnosis not present

## 2021-04-14 LAB — CYTOLOGY - PAP
Comment: NEGATIVE
Diagnosis: NEGATIVE
High risk HPV: NEGATIVE

## 2021-06-10 DIAGNOSIS — H9313 Tinnitus, bilateral: Secondary | ICD-10-CM | POA: Insufficient documentation

## 2021-06-10 DIAGNOSIS — H903 Sensorineural hearing loss, bilateral: Secondary | ICD-10-CM | POA: Diagnosis not present

## 2021-06-13 DIAGNOSIS — E559 Vitamin D deficiency, unspecified: Secondary | ICD-10-CM | POA: Diagnosis not present

## 2021-06-13 DIAGNOSIS — E785 Hyperlipidemia, unspecified: Secondary | ICD-10-CM | POA: Diagnosis not present

## 2021-06-13 DIAGNOSIS — Z79899 Other long term (current) drug therapy: Secondary | ICD-10-CM | POA: Diagnosis not present

## 2021-07-08 DIAGNOSIS — Z23 Encounter for immunization: Secondary | ICD-10-CM | POA: Diagnosis not present

## 2022-06-30 ENCOUNTER — Other Ambulatory Visit: Payer: Self-pay | Admitting: Family Medicine

## 2022-06-30 DIAGNOSIS — Z1231 Encounter for screening mammogram for malignant neoplasm of breast: Secondary | ICD-10-CM

## 2022-07-23 ENCOUNTER — Ambulatory Visit
Admission: RE | Admit: 2022-07-23 | Discharge: 2022-07-23 | Disposition: A | Discharge status: Home / Self Care | Payer: 59 | Source: Ambulatory Visit | Attending: Family Medicine | Admitting: Family Medicine

## 2022-07-23 DIAGNOSIS — Z1231 Encounter for screening mammogram for malignant neoplasm of breast: Secondary | ICD-10-CM

## 2022-11-08 ENCOUNTER — Emergency Department (HOSPITAL_COMMUNITY)
Admission: EM | Admit: 2022-11-08 | Discharge: 2022-11-08 | Disposition: A | Discharge status: Home / Self Care | Payer: 59 | Source: Home / Self Care | Attending: Emergency Medicine | Admitting: Emergency Medicine

## 2022-11-08 ENCOUNTER — Emergency Department (HOSPITAL_COMMUNITY): Payer: 59

## 2022-11-08 ENCOUNTER — Encounter (HOSPITAL_BASED_OUTPATIENT_CLINIC_OR_DEPARTMENT_OTHER): Payer: Self-pay | Admitting: Emergency Medicine

## 2022-11-08 ENCOUNTER — Other Ambulatory Visit: Payer: Self-pay

## 2022-11-08 ENCOUNTER — Emergency Department (HOSPITAL_BASED_OUTPATIENT_CLINIC_OR_DEPARTMENT_OTHER): Payer: 59

## 2022-11-08 ENCOUNTER — Inpatient Hospital Stay (HOSPITAL_COMMUNITY)
Admission: EM | Admit: 2022-11-08 | Discharge: 2022-11-13 | DRG: 065 | Disposition: A | Discharge status: Rehabilitation Facility | Payer: 59 | Attending: Internal Medicine | Admitting: Internal Medicine

## 2022-11-08 DIAGNOSIS — E669 Obesity, unspecified: Secondary | ICD-10-CM | POA: Diagnosis not present

## 2022-11-08 DIAGNOSIS — Z79899 Other long term (current) drug therapy: Secondary | ICD-10-CM

## 2022-11-08 DIAGNOSIS — I6381 Other cerebral infarction due to occlusion or stenosis of small artery: Secondary | ICD-10-CM | POA: Diagnosis not present

## 2022-11-08 DIAGNOSIS — R471 Dysarthria and anarthria: Secondary | ICD-10-CM | POA: Diagnosis not present

## 2022-11-08 DIAGNOSIS — G43909 Migraine, unspecified, not intractable, without status migrainosus: Secondary | ICD-10-CM | POA: Diagnosis not present

## 2022-11-08 DIAGNOSIS — E7800 Pure hypercholesterolemia, unspecified: Secondary | ICD-10-CM | POA: Diagnosis not present

## 2022-11-08 DIAGNOSIS — Z6834 Body mass index (BMI) 34.0-34.9, adult: Secondary | ICD-10-CM | POA: Diagnosis not present

## 2022-11-08 DIAGNOSIS — I639 Cerebral infarction, unspecified: Secondary | ICD-10-CM | POA: Diagnosis present

## 2022-11-08 DIAGNOSIS — R29810 Facial weakness: Secondary | ICD-10-CM | POA: Diagnosis present

## 2022-11-08 DIAGNOSIS — R29704 NIHSS score 4: Secondary | ICD-10-CM | POA: Diagnosis not present

## 2022-11-08 DIAGNOSIS — R531 Weakness: Principal | ICD-10-CM

## 2022-11-08 DIAGNOSIS — R4789 Other speech disturbances: Secondary | ICD-10-CM

## 2022-11-08 DIAGNOSIS — K219 Gastro-esophageal reflux disease without esophagitis: Secondary | ICD-10-CM | POA: Diagnosis not present

## 2022-11-08 DIAGNOSIS — R519 Headache, unspecified: Secondary | ICD-10-CM | POA: Insufficient documentation

## 2022-11-08 DIAGNOSIS — R29700 NIHSS score 0: Secondary | ICD-10-CM | POA: Diagnosis not present

## 2022-11-08 DIAGNOSIS — E785 Hyperlipidemia, unspecified: Secondary | ICD-10-CM | POA: Diagnosis present

## 2022-11-08 DIAGNOSIS — G8191 Hemiplegia, unspecified affecting right dominant side: Secondary | ICD-10-CM | POA: Diagnosis present

## 2022-11-08 DIAGNOSIS — R202 Paresthesia of skin: Secondary | ICD-10-CM

## 2022-11-08 HISTORY — DX: Pure hypercholesterolemia, unspecified: E78.00

## 2022-11-08 LAB — CBC WITH DIFFERENTIAL/PLATELET
Abs Immature Granulocytes: 0.04 10*3/uL (ref 0.00–0.07)
Basophils Absolute: 0 10*3/uL (ref 0.0–0.1)
Basophils Relative: 0 %
Eosinophils Absolute: 0.2 10*3/uL (ref 0.0–0.5)
Eosinophils Relative: 2 %
HCT: 45.3 % (ref 36.0–46.0)
Hemoglobin: 15 g/dL (ref 12.0–15.0)
Immature Granulocytes: 0 %
Lymphocytes Relative: 25 %
Lymphs Abs: 2.4 10*3/uL (ref 0.7–4.0)
MCH: 28.1 pg (ref 26.0–34.0)
MCHC: 33.1 g/dL (ref 30.0–36.0)
MCV: 85 fL (ref 80.0–100.0)
Monocytes Absolute: 0.7 10*3/uL (ref 0.1–1.0)
Monocytes Relative: 8 %
Neutro Abs: 6.5 10*3/uL (ref 1.7–7.7)
Neutrophils Relative %: 65 %
Platelets: 373 10*3/uL (ref 150–400)
RBC: 5.33 MIL/uL — ABNORMAL HIGH (ref 3.87–5.11)
RDW: 13.3 % (ref 11.5–15.5)
WBC: 9.9 10*3/uL (ref 4.0–10.5)
nRBC: 0 % (ref 0.0–0.2)

## 2022-11-08 LAB — PROTIME-INR
INR: 0.9 (ref 0.8–1.2)
Prothrombin Time: 11.6 seconds (ref 11.4–15.2)

## 2022-11-08 LAB — BASIC METABOLIC PANEL
Anion gap: 7 (ref 5–15)
BUN: 11 mg/dL (ref 6–20)
CO2: 28 mmol/L (ref 22–32)
Calcium: 9.1 mg/dL (ref 8.9–10.3)
Chloride: 106 mmol/L (ref 98–111)
Creatinine, Ser: 0.77 mg/dL (ref 0.44–1.00)
GFR, Estimated: >60 mL/min (ref >60)
Glucose, Bld: 127 mg/dL — ABNORMAL HIGH (ref 70–99)
Potassium: 3.9 mmol/L (ref 3.5–5.1)
Sodium: 141 mmol/L (ref 135–145)

## 2022-11-08 LAB — ETHANOL: Alcohol, Ethyl (B): <10 mg/dL (ref <10)

## 2022-11-08 LAB — CBG MONITORING, ED: Glucose-Capillary: 126 mg/dL — ABNORMAL HIGH (ref 70–99)

## 2022-11-08 MED ORDER — KETOROLAC TROMETHAMINE 30 MG/ML IJ SOLN: 30.0000 mg | Freq: Once | INTRAMUSCULAR | Status: DC

## 2022-11-08 MED ORDER — DIPHENHYDRAMINE HCL 50 MG/ML IJ SOLN: 25.0000 mg | Freq: Once | INTRAMUSCULAR | Status: DC

## 2022-11-08 MED ORDER — NAPHAZOLINE-GLYCERIN 0.012-0.25 % OP SOLN: 1.0000 [drp] | Freq: Four times a day (QID) | OPHTHALMIC | Status: DC | PRN

## 2022-11-08 MED ORDER — DEXAMETHASONE SODIUM PHOSPHATE 10 MG/ML IJ SOLN: 10.0000 mg | Freq: Once | INTRAMUSCULAR | Status: DC

## 2022-11-08 MED ORDER — METOCLOPRAMIDE HCL 5 MG/ML IJ SOLN: 10.0000 mg | Freq: Once | INTRAMUSCULAR | Status: DC

## 2022-11-08 MED ORDER — SODIUM CHLORIDE 0.9 % IV BOLUS
1000.0000 mL | Freq: Once | INTRAVENOUS | Status: AC
  Administered 2022-11-08: 1000 mL via INTRAVENOUS

## 2022-11-08 NOTE — ED Provider Notes (Signed)
[facility] EMERGENCY DEPARTMENT Provider Note   CSN: [id number] Arrival date & time: 11/08/22  0204     History  Chief Complaint  Patient presents with   Headache    Giannina Bartolome is a 61 y.o. female.  Patient is a 61 year old female with past medical history of hyperlipidemia.  Patient presenting today with complaints of headache and neurologic deficits.  Patient tells me that 24 hours ago, she woke with what she described as a burning behind her eyes.  She also described a headache.  Symptoms seem to resolve, then she went back to bed.  She experienced similar symptoms this afternoon, then again that woke her from sleep.  She was laying in her recliner when she developed the same pressure in her head.  This time she developed tingling to her lips and fingers and was unable to speak clearly and was unable to get up out of the recliner.  This seemed to resolve after approximately 10 minutes, then presented here for evaluation.  Denies history of stroke or migraines.  She denies recent illness.  The history is provided by the patient.       Home Medications Prior to Admission medications   Not on File      Allergies    Patient has no known allergies.    Review of Systems   Review of Systems  All other systems reviewed and are negative.   Physical Exam Updated Vital Signs BP 138/71   Pulse 86   Temp 98.3 F (36.8 C) (Oral)   Resp 20   Ht 5\' 2"  (1.575 m)   Wt 86.2 kg   SpO2 97%   BMI 34.75 kg/m  Physical Exam Vitals and nursing note reviewed.  Constitutional:      General: She is not in acute distress.    Appearance: She is well-developed. She is not diaphoretic.  HENT:     Head: Normocephalic and atraumatic.  Cardiovascular:     Rate and Rhythm: Normal rate and regular rhythm.     Heart sounds: No murmur heard.    No friction rub. No gallop.  Pulmonary:     Effort: Pulmonary effort is normal. No respiratory distress.     Breath sounds: Normal  breath sounds. No wheezing.  Abdominal:     General: Bowel sounds are normal. There is no distension.     Palpations: Abdomen is soft.     Tenderness: There is no abdominal tenderness.  Musculoskeletal:        General: Normal range of motion.     Cervical back: Normal range of motion and neck supple.  Skin:    General: Skin is warm and dry.  Neurological:     General: No focal deficit present.     Mental Status: She is alert and oriented to person, place, and time. Mental status is at baseline.     Cranial Nerves: No cranial nerve deficit, dysarthria or facial asymmetry.     ED Results / Procedures / Treatments   Labs (all labs ordered are listed, but only abnormal results are displayed) Labs Reviewed  CBG MONITORING, ED - Abnormal; Notable for the following components:      Result Value   Glucose-Capillary 126 (*)    All other components within normal limits  BASIC METABOLIC PANEL  CBC WITH DIFFERENTIAL/PLATELET  PROTIME-INR  ETHANOL    EKG EKG Interpretation  Date/Time:  Sunday November 08 2022 02:15:12 EST Ventricular Rate:  85 PR Interval:  146 QRS Duration: 85 QT Interval:  348 QTC Calculation: 414 R Axis:   -18 Text Interpretation: Sinus rhythm Borderline left axis deviation Confirmed by Geoffery Lyons ([id number]) on 11/08/2022 2:59:50 AM  Radiology No results found.  Procedures Procedures    Medications Ordered in ED Medications - No data to display  ED Course/ Medical Decision Making/ A&P  Patient is a 61 year old female presenting here with complaints of headache and arm numbness as described in the HPI.  She arrives here neurologically intact with stable vital signs.  Physical examination is otherwise unremarkable.  Workup initiated including CBC and metabolic panel.  These were both unremarkable.  Head CT obtained showing no acute intra-abdominal process.  Due to the waxing and waning neurologic symptoms the patient is describing, I elected to obtain  teleneurology consultation.  Patient has been evaluated and symptoms felt to be likely related to a migraine, but MRI is recommended.  Patient given a migraine cocktail consisting of normal saline, Decadron, Toradol, Benadryl, and Reglan.  Patient to be transferred to [facility] for [facility].  Dr. Bebe Shaggy excepting in transfer.  I have also notified Dr. Amada Jupiter from neurology of the situation.    Final Clinical Impression(s) / ED Diagnoses Final diagnoses:  None    Rx / DC Orders ED Discharge Orders     None         Geoffery Lyons, MD 11/08/22 [phone]

## 2022-11-08 NOTE — Discharge Instructions (Addendum)
It was our pleasure to provide your ER care today - we hope that you feel better.  Your MRI looks good - no stroke was noted.   Follow up closely with primary care doctor/neurologist in the next 1-2 weeks.   Take an enteric coated aspirin a day.   Return to ER if worse, new symptoms, fevers, one-sided numbness/weakness, change in speech or vision, chest pain, trouble breathing, fainting, or other emergency concern.

## 2022-11-08 NOTE — ED Notes (Signed)
Report given to [facility]. 

## 2022-11-08 NOTE — ED Provider Triage Note (Signed)
Emergency Medicine Provider Triage Evaluation Note  Joy Shelton , a 61 y.o. female  was evaluated in triage.  Pt complains of tingling to the right side of her body.  She was just discharge earlier today with a normal CT, MRI, and lab work.  She reports that she is still having trouble with her speech and she feels like the diffuse body tingling is now more to her right side.  She is tearful..  Review of Systems  Positive:  Negative:   Physical Exam  There were no vitals taken for this visit. Gen:   Awake, no distress   Resp:  Normal effort  MSK:   Moves extremities without difficulty  Other:    Medical Decision Making  Medically screening exam initiated at 7:59 PM.  Appropriate orders placed.  Joy Shelton was informed that the remainder of the evaluation will be completed by another provider, this initial triage assessment does not replace that evaluation, and the importance of remaining in the ED until their evaluation is complete.  Will observe.  No lab work or imaging will be ordered after consulting with my attending physician.   Achille Rich, PA-C 11/08/22 2001

## 2022-11-08 NOTE — ED Provider Notes (Signed)
Pt describes episodes of bilateral hand/fingers numbness/tingling and periorbital/lip numbness. Symptoms currently resolved. MRI neg for acute process. Electrolytes normal.   Pt currently appears stable for d/c.  Rec close pcp/neurology f/u as outpatient.   Return precautions provided.       Cathren Laine, MD 11/08/22 [phone]

## 2022-11-08 NOTE — ED Triage Notes (Signed)
Patient arrived with EMS from home , reports right side numbness for several days and this afternoon , patient was just discharged here this afternoon , blood tests , CT scan and MRI done .

## 2022-11-08 NOTE — ED Triage Notes (Signed)
Pt c/o headache with pain behind her eyes that started 24 hours ago. Pt also c/o tingling to her lips and fingers that started at 2200 last night. Pt states that she woke up about 0145 and was unable to get out of the chair or get her speech out clearly to her father. All symptoms and pain is resolved at time of triage. Pt able to walk to treatment with a steady gait and has clear speech at this time.

## 2022-11-08 NOTE — ED Notes (Signed)
[facility] at bedside 

## 2022-11-08 NOTE — Consult Note (Signed)
[facility] TeleNeurology Consult Services  Stat Consult  Patient Name:   Joy Shelton, Joy Shelton Date of Birth:   November 07, 1961 Identification Number:   MRN - [id number] Date of Service:   11/08/2022 03:56:34  Diagnosis:       R51.9 - Headache, unspecified       R20.2 - Paresthesia of skin  Impression 61 y/o woman with history of HLD presenting with episodes of retro-orbital pressure with bilateral paresthesia, with one accompanied by difficulty speaking. The bilateral nature of her symptoms seem to speak against ischemic stroke. 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 Due to her age and the new presentation of this, would recommend observation with MRI of the brain wo contrast. Would also treat her symptoms as migraine to see if there is improvement. Could also consider a routine EEG if the symptoms persist. Discussed with Dr. Stark Jock.   Recommendations: Our recommendations are outlined below.  Diagnostic Studies : MRI Brain without contrast  Laboratory Studies : ESR/CRP  Medications : Zofran 4 mg PO prn for headacheIV Toradol  Nursing Recommendations : Neuro checks q6 hrsWhen possible avoid opioid pain medications as this can lead to worsening headaches with rebound phenomenon  Consultations : Will need outpatient follow up with Neurology and PCP in 1-2 weeks  DVT Prophylaxis : Choice of Primary Team  Disposition : Neurology will follow   ----------------------------------------------------------------------------------------------------    Metrics: [facility] Notification Time: 11/08/2022 03:55:12 Stamp Time: 11/08/2022 03:56:34 Callback Response Time: 11/08/2022 03:57:06  Primary Provider Notified of Diagnostic Impression and Management Plan on: 11/08/2022 04:30:36   CT HEAD: As Per Radiologist CT Head Showed No Acute Hemorrhage or Acute  Core Infarct    ----------------------------------------------------------------------------------------------------  Chief Complaint: paresthesia  History of Present Illness: Patient is a 61 year old Female. 61 y/o woman with history of hyperlipidemia presenting with episodes of paresthesia with headache. States that she would feel a pressure behind the left eye, then her mouth and eye feel dry, progressing to numbness in her lips bilaterally. Then she can feel numbness and tingling in her fingertips bilaterally. During one of these episodes, she had difficulty speaking and that's when she became concerned and presented for evaluation. The symptoms began about 24 hours ago, and has had more than one episode over that span. Currently at baseline. Does admit to some slight nausea at the beginning of these symptoms.       Past Medical History:      Hyperlipidemia  Medications:  No Anticoagulant use  No Antiplatelet use Reviewed EMR for current medications  Allergies:  Reviewed  Social History: Drug Use: No  Family History:  There is no family history of premature cerebrovascular disease pertinent to this consultation  ROS : 14 Points Review of Systems was performed and was negative except mentioned in HPI.  Past Surgical History: There Is No Surgical History Contributory To Today's Visit   Examination: BP(116/61), Pulse(74),  Neuro Exam: General: Alert,Awake, Oriented to Time, Place, Person  Speech: Fluent:  Language: Intact:  Face: Symmetric:  Extraocular Movements: Intact:  Motor Exam: No Drift:  Sensation: Intact:  Spoke with : Dr. Stark Jock    Patient / Family was informed the Neurology Consult would occur via TeleHealth consult by way of interactive audio and video telecommunications and consented to receiving care in this manner.  Patient is being evaluated for possible acute neurologic impairment and high probability of imminent  or life -  threatening deterioration.I spent total of 18 minutes providing care to this patient, including time for face to face visit via telemedicine, review of medical records, imaging studies and discussion of findings with providers, the patient and / or family.   Dr Shari Prows   [facility] For Inpatient follow-up with [facility] physician please call [facility] [phone]. This is not an outpatient service. Post hospital discharge, please contact hospital directly.  Please do not communicate with [facility] physicians via secure chat. If you have any questions, Please contact [facility].

## 2022-11-08 NOTE — ED Notes (Signed)
Pt reports that she does not have a headache at this time and would like to hold off on the medications until she needs them. Reports she does not really have a HA, it is pain behind the eye and numbness to lips.

## 2022-11-08 NOTE — ED Notes (Signed)
Pt father called out for RN; walked into the room and father reported pt was having one of her spells. Symptoms included numbness to the lips and inability to speak. Pt appeared frustrated and could not speak words when asked if she was okay. This RN stayed in the room with the pt for 2 minutes until symptoms resolved. Pt lips appeared discolored and pale. When symptoms resolved and pt was able to speak, lips appeared to turn pink. Pt denied numbness in her fingers and hands when the episode occurred. Pt denied any pain during this episode and states she feels better now. Symptoms occurred from 5:46 to 5:48 am.

## 2022-11-09 ENCOUNTER — Observation Stay (HOSPITAL_COMMUNITY): Payer: 59

## 2022-11-09 ENCOUNTER — Encounter (HOSPITAL_COMMUNITY): Payer: Self-pay | Admitting: Internal Medicine

## 2022-11-09 ENCOUNTER — Inpatient Hospital Stay (HOSPITAL_COMMUNITY): Payer: 59

## 2022-11-09 DIAGNOSIS — I6381 Other cerebral infarction due to occlusion or stenosis of small artery: Secondary | ICD-10-CM | POA: Diagnosis present

## 2022-11-09 DIAGNOSIS — I6389 Other cerebral infarction: Secondary | ICD-10-CM | POA: Diagnosis not present

## 2022-11-09 DIAGNOSIS — R269 Unspecified abnormalities of gait and mobility: Secondary | ICD-10-CM | POA: Diagnosis not present

## 2022-11-09 DIAGNOSIS — K219 Gastro-esophageal reflux disease without esophagitis: Secondary | ICD-10-CM | POA: Diagnosis present

## 2022-11-09 DIAGNOSIS — I959 Hypotension, unspecified: Secondary | ICD-10-CM | POA: Diagnosis not present

## 2022-11-09 DIAGNOSIS — I639 Cerebral infarction, unspecified: Secondary | ICD-10-CM

## 2022-11-09 DIAGNOSIS — G43909 Migraine, unspecified, not intractable, without status migrainosus: Secondary | ICD-10-CM | POA: Diagnosis present

## 2022-11-09 DIAGNOSIS — I6300 Cerebral infarction due to thrombosis of unspecified precerebral artery: Secondary | ICD-10-CM | POA: Diagnosis not present

## 2022-11-09 DIAGNOSIS — E7849 Other hyperlipidemia: Secondary | ICD-10-CM | POA: Diagnosis not present

## 2022-11-09 DIAGNOSIS — R29810 Facial weakness: Secondary | ICD-10-CM | POA: Diagnosis present

## 2022-11-09 DIAGNOSIS — Z6834 Body mass index (BMI) 34.0-34.9, adult: Secondary | ICD-10-CM | POA: Diagnosis not present

## 2022-11-09 DIAGNOSIS — E7800 Pure hypercholesterolemia, unspecified: Secondary | ICD-10-CM | POA: Diagnosis present

## 2022-11-09 DIAGNOSIS — R195 Other fecal abnormalities: Secondary | ICD-10-CM | POA: Diagnosis not present

## 2022-11-09 DIAGNOSIS — R29704 NIHSS score 4: Secondary | ICD-10-CM | POA: Diagnosis present

## 2022-11-09 DIAGNOSIS — E785 Hyperlipidemia, unspecified: Secondary | ICD-10-CM | POA: Diagnosis present

## 2022-11-09 DIAGNOSIS — G8191 Hemiplegia, unspecified affecting right dominant side: Secondary | ICD-10-CM | POA: Diagnosis present

## 2022-11-09 DIAGNOSIS — R29700 NIHSS score 0: Secondary | ICD-10-CM | POA: Diagnosis not present

## 2022-11-09 DIAGNOSIS — G4733 Obstructive sleep apnea (adult) (pediatric): Secondary | ICD-10-CM | POA: Diagnosis not present

## 2022-11-09 DIAGNOSIS — R531 Weakness: Principal | ICD-10-CM

## 2022-11-09 DIAGNOSIS — E669 Obesity, unspecified: Secondary | ICD-10-CM | POA: Diagnosis present

## 2022-11-09 DIAGNOSIS — Z79899 Other long term (current) drug therapy: Secondary | ICD-10-CM | POA: Diagnosis not present

## 2022-11-09 DIAGNOSIS — R471 Dysarthria and anarthria: Secondary | ICD-10-CM | POA: Diagnosis present

## 2022-11-09 HISTORY — DX: Cerebral infarction, unspecified: I63.9

## 2022-11-09 LAB — I-STAT CHEM 8, ED
BUN: 9 mg/dL (ref 6–20)
Calcium, Ion: 1.14 mmol/L — ABNORMAL LOW (ref 1.15–1.40)
Chloride: 108 mmol/L (ref 98–111)
Creatinine, Ser: 0.6 mg/dL (ref 0.44–1.00)
Glucose, Bld: 105 mg/dL — ABNORMAL HIGH (ref 70–99)
HCT: 40 % (ref 36.0–46.0)
Hemoglobin: 13.6 g/dL (ref 12.0–15.0)
Potassium: 3.7 mmol/L (ref 3.5–5.1)
Sodium: 142 mmol/L (ref 135–145)
TCO2: 23 mmol/L (ref 22–32)

## 2022-11-09 LAB — LIPID PANEL
Cholesterol: 208 mg/dL — ABNORMAL HIGH (ref 0–200)
HDL: 56 mg/dL (ref >40)
LDL Cholesterol: 139 mg/dL — ABNORMAL HIGH (ref 0–99)
Total CHOL/HDL Ratio: 3.7 RATIO
Triglycerides: 66 mg/dL (ref <150)
VLDL: 13 mg/dL (ref 0–40)

## 2022-11-09 MED ORDER — IOHEXOL 350 MG/ML SOLN
75.0000 mL | Freq: Once | INTRAVENOUS | Status: AC | PRN
  Administered 2022-11-09: 75 mL via INTRAVENOUS

## 2022-11-09 MED ORDER — ASPIRIN 81 MG PO TBEC
81.0000 mg | DELAYED_RELEASE_TABLET | Freq: Every day | ORAL | Status: DC
  Administered 2022-11-09 – 2022-11-13 (×5): 81 mg via ORAL
  Filled 2022-11-09 (×5): qty 1

## 2022-11-09 MED ORDER — ATORVASTATIN CALCIUM 80 MG PO TABS
80.0000 mg | ORAL_TABLET | Freq: Every day | ORAL | Status: DC
  Administered 2022-11-09 – 2022-11-11 (×3): 80 mg via ORAL
  Filled 2022-11-09 (×3): qty 1

## 2022-11-09 MED ORDER — ROSUVASTATIN CALCIUM 5 MG PO TABS: 10.0000 mg | ORAL_TABLET | Freq: Every day | ORAL | Status: DC

## 2022-11-09 MED ORDER — CLOPIDOGREL BISULFATE 75 MG PO TABS
300.0000 mg | ORAL_TABLET | Freq: Once | ORAL | Status: AC
  Administered 2022-11-09: 300 mg via ORAL
  Filled 2022-11-09: qty 4

## 2022-11-09 MED ORDER — GADOBUTROL 1 MMOL/ML IV SOLN
9.0000 mL | Freq: Once | INTRAVENOUS | Status: AC | PRN
  Administered 2022-11-09: 9 mL via INTRAVENOUS

## 2022-11-09 MED ORDER — CLOPIDOGREL BISULFATE 75 MG PO TABS
75.0000 mg | ORAL_TABLET | Freq: Every day | ORAL | Status: DC
  Administered 2022-11-10 – 2022-11-13 (×4): 75 mg via ORAL
  Filled 2022-11-09 (×4): qty 1

## 2022-11-09 MED ORDER — STROKE: EARLY STAGES OF RECOVERY BOOK
Freq: Once | Status: AC
  Filled 2022-11-09: qty 1

## 2022-11-09 NOTE — H&P (Addendum)
History and Physical    Patient: Joy Shelton MWU:[id number] DOB: November 17, 1960 DOA: 11/08/2022 DOS: the patient was seen and examined on 11/09/2022 PCP: Harlan Stains, MD  Patient coming from: Home via EMS  Chief Complaint:  Chief Complaint  Patient presents with   Numbness    HPI: Joy Shelton is a 62 y.o. female with medical history significant of hypercholesterolemia who presents with complaints of right-sided weakness and difficulty talking. Patient had just been seen in the emergency department yesterday with complaints of episodes of numbness/tingling of the bilateral hands/fingers and lips.  She was worked up and had MRI of the brain which was negative for any acute process and discharged home.  After getting home family notes that symptoms started to change and patient developed weakness on the right side.  Noted right-sided weakness of her face, arm, and leg on the right side.  Her speech was slightly slurred last night but became more pronounced overnight.  In the emergency department patient was noted to be afebrile with pulse 72-104, and all other vital signs fairly maintained.  NIH score was 3.  Labs are unremarkable.  Neurology has been consulted with mentations to admit for further workup with MRI with and without contrast of the brain and EEG.    Review of Systems: As mentioned in the history of present illness. All other systems reviewed and are negative. Past Medical History:  Diagnosis Date   Hypercholesteremia    No past surgical history on file. Social History:  reports that she has never smoked. She has never used smokeless tobacco. She reports that she does not currently use alcohol. She reports that she does not use drugs.  No Known Allergies  No family history on file.  Prior to Admission medications   Not on File    Physical Exam: Vitals:   11/08/22 2009 11/08/22 2203 11/09/22 0544 11/09/22 1041  BP: (!) 144/70 133/73 137/81 (!) 143/60  Pulse: (!)  104 90 87 83  Resp: 16 18 18 15   Temp:  97.9 F (36.6 C) 98.1 F (36.7 C) (!) 97.4 F (36.3 C)  TempSrc:    Oral  SpO2: 98% 95% 97% 100%   Constitutional: Obese female who appears to be in no acute distress Eyes: PERRL, lids and conjunctivae normal ENMT: Mucous membranes are moist.  Right-sided facial droop present. Neck: normal, supple Respiratory: clear to auscultation bilaterally, no wheezing, no crackles. Normal respiratory effort. No accessory muscle use.  Cardiovascular: Regular rate and rhythm, no murmurs / rubs / gallops. No extremity edema.   Abdomen: no tenderness, no masses palpated. Bowel sounds positive.  Musculoskeletal: no clubbing / cyanosis. No joint deformity upper and lower extremities. Good ROM, no contractures. Normal muscle tone.  Skin: no rashes, lesions, ulcers. No induration Neurologic: CN 2-12 grossly intact.  Strength 3/5 in the right upper and lower extremity.  Strength 5/5 in the left upper and lower extremity.  Patient speech is slurred, but able to understand what she is saying Psychiatric: Normal judgment and insight. Alert and oriented x 3. Normal mood.   Data Reviewed:  Reviewed labs, imaging, and pertinent records as noted above in HPI.  Assessment and Plan: CVA Acute.  Patient present initially yesterday with complaints of numbness tingling of the bilateral hands and had been evaluation with MRI which was negative at that time and sent home.  Symptoms started change after getting home with right-sided weakness and change in speech.  Initially orders were placed for MRI and  EEG.  MRI reveals acute lacunar infarcts in the central pons left greater than right.  EEG no longer felt warranted.  -Admit to a cardiac telemetry bed -Stroke order set utilized -Follow-up lipid panel and hemoglobin A1c -Check CT angiogram of the head and neck -Check echocardiogram -PT/OT/speech to eval and treat -Allow for permissive hypertension -Appreciate neurology  consultative services,  will follow-up for any further recommendations  Hyperlipidemia Home medication regimen includes Crestor 10 mg daily.  Lipid panel revealed total cholesterol to be 208 with LDL 139. -Goal LDL is less than than 70 -Start atorvastatin 80 mg daily  Obesity BMI 34.75 kg/m  DVT prophylaxis: Lovenox Advance Care Planning:   Code Status: Not on file    Consults: Neurology  Family Communication: Sister and father updated at bedside  Severity of Illness: The appropriate patient status for this patient is INPATIENT. Inpatient status is judged to be reasonable and necessary in order to provide the required intensity of service to ensure the patient's safety. The patient's presenting symptoms, physical exam findings, and initial radiographic and laboratory data in the context of their chronic comorbidities is felt to place them at high risk for further clinical deterioration. Furthermore, it is not anticipated that the patient will be medically stable for discharge from the hospital within 2 midnights of admission.   * I certify that at the point of admission it is my clinical judgment that the patient will require inpatient hospital care spanning beyond 2 midnights from the point of admission due to high intensity of service, high risk for further deterioration and high frequency of surveillance required.*  Author: Norval Morton, MD 11/09/2022 11:10 AM  For on call review [email or web address].

## 2022-11-09 NOTE — Consult Note (Signed)
Neurology Consultation Reason for Consult: Stroke on MRI Requesting Physician: Ray  CC: Numbness  History is obtained from: Patient, chart review  HPI: Joy Shelton is a 62 y.o. female with a past medical history of hyperlipidemia and difficulty swallowing who initially presented on 11/08/2022 to [facility] reporting a headache and tingling in her lips and fingers.  The first episode of tingling was at 0100 on SATURDAY morning.  She states that Saturday morning her symptoms started with a burning sensation in her eyes and a mild headache that lasted approximately 2-3 minutes. It returned Saturday afternoon and last about 2-3 minutes again. At 0100 Sunday she wasn't talking normally and had had tingling in her face and hands. She went to [facility] and was transferred to [facility] ED for an MRI brain without contrast which showed no acute abnormality.  She was discharged from the ED with no symptoms and recommended to follow-up with outpatient neurology.  She returned to the ED last night reporting difficulty speaking and paresthesias on the right side.  To attending MD she provides a slightly different history noting intermittent numbness on the right side starting Saturday that was coming and going until Sunday at which time it became persistent  LKW: 11/06/2022 Thrombolytic given?: No, outside of window IA performed?: No Premorbid modified rankin scale: 0 - No symptoms.       ROS: All other review of systems was negative except as noted in the HPI.   Past Medical History:  Diagnosis Date   Hypercholesteremia      No family history on file.   Social History:  reports that she has never smoked. She has never used smokeless tobacco. She reports that she does not currently use alcohol. She reports that she does not use drugs.   Exam: Current vital signs: BP (!) 143/60 (BP Location: Right Arm)   Pulse 83   Temp (!) 97.4 F (36.3 C) (Oral)   Resp 15   SpO2 100%   Vital signs in last 24 hours: Temp:  [97.4 F (36.3 C)-98.6 F (37 C)] 97.4 F (36.3 C) (01/01 1041) Pulse Rate:  [75-104] 83 (01/01 1041) Resp:  [15-18] 15 (01/01 1041) BP: (102-144)/(51-81) 143/60 (01/01 1041) SpO2:  [95 %-100 %] 100 % (01/01 1041)   Physical Exam  Constitutional: Appears well-developed and well-nourished.  Psych: Affect appropriate to situation,  Eyes: No scleral injection HENT: No oropharyngeal obstruction.  MSK: no joint deformities.  Cardiovascular: Normal rate and regular rhythm.  Perfusing extremities well Respiratory: Effort normal, non-labored breathing GI: Soft.  No distension. There is no tenderness.  Skin: Warm dry and intact visible skin  Neuro: Mental Status: Patient is awake, alert, oriented to person, place, month, year, and situation. Moderate dysarthria, but fluency and comprhension are intact No signs of aphasia or neglect Cranial Nerves: II: Visual Fields are full. Pupils are equal, round, and reactive to light.   III,IV, VI: EOMI without ptosis or diploplia.  However she does have some intermittent saccadic intrusions V: Facial sensation is symmetric to temperature VII: right facial droop VIII: hearing is intact to voice X: Uvula elevates symmetrically XI: Shoulder shrug is symmetric. XII: tongue is midline without atrophy or fasciculations.  Motor: Tone is normal. Bulk is normal. RUE 4/5  LUE 5/5 RLE 4/5  LLE 5/5 On later attending evaluation did not have right lower extremity drift but did have pronator drift of the right upper extremity only Sensory: Sensation is symmetric to light touch and  temperature in the arms and legs. Deep Tendon Reflexes: 3+ and symmetric in the brachioradialis and patellae.  Positive Hoffmann's bilaterally.  Right toe is upgoing, left toes downgoing Cerebellar: FNF and HKS are intact, no ataxia out of proportion to weakness Gait:  Deferred in acute setting   NIHSS total  Score breakdown:  1a  Level of Conscious.: 0 1b LOC Questions: 0 1c LOC Commands: 0 2 Best Gaze: 0 3 Visual: 0 4 Facial Palsy: 1 5a Motor Arm - left: 0 5b Motor Arm - Right: 1  6a Motor Leg - Left: 0 6b Motor Leg - Right: 1 7 Limb Ataxia: 0 8 Sensory: 0 9 Best Language: 0 10 Dysarthria: 1 11 Extinct. and Inatten.: 0 TOTAL: 4  I have reviewed labs in epic and the results pertinent to this consultation are: BMP- unremarkable Cr 0.6  I have reviewed the images obtained:  MRI Brain -positive for Acute lacunar infarcts in the central pons, left > right. No associated hemorrhage or mass effect.   CT Angio Head and Neck  1. No emergent large vessel occlusions. Normal variant CTA Circle of Willis without proximal stenosis, aneurysm or branch vessel occlusion. 2. Subtle vessel wall irregularity in the mid cervical internal carotid arteries bilaterally suggesting fibromuscular dysplasia. No significant stenosis is present. 3. Minimal calcifications at the right carotid bifurcation without significant stenosis. 4. Patchy ground-glass attenuation at both lung apices. This could represent atelectasis or edema. Infection is not excluded.  Impression:  Acute lacunar infarct in the left pons, likely secondary to small vessel risk factors  Recommendations: - HgbA1c, fasting lipid panel - Frequent neuro checks - Echocardiogram - Carotid dopplers - DAPT with aspirin 81mg  and plavix 75mg  after load  - Continue for 3 weeks and then ASA 81mg  alone daily - Risk factor modification - Telemetry monitoring - PT consult, OT consult, Speech consult - Stroke team to follow  [place] [phone] Available 7 AM to 7 PM, outside these hours please contact Neurologist on call listed on AMION

## 2022-11-09 NOTE — ED Provider Notes (Signed)
[facility] EMERGENCY DEPARTMENT Provider Note   CSN: [id number] Arrival date & time: 11/08/22  1947     History  Chief Complaint  Patient presents with   Numbness     Kelita Wallis is a 62 y.o. female.  HPI 62 year old female presents today with right-sided weakness.  Patient began having symptoms approximately 36 hours ago perioral paresthesias bilateral arm paresthesias.  She was seen and evaluated at [facility].  At that point she had a head CT that did not show any evidence of acute abnormality.  Neurology was consulted and advised transfer to [facility] for MRI and possible EEG monitoring.  MRI was obtained and was negative.  Patient was discharged home approximately 3 PM.  Yesterday afternoon around 5:00 she began having right sided weakness with difficulty speaking and return to the ED.  No further evaluation at that point was ordered due to patient's recent MRI.  She is continue to have symptoms.    Home Medications Prior to Admission medications   Not on File      Allergies    Patient has no known allergies.    Review of Systems   Review of Systems  Physical Exam Updated Vital Signs BP (!) 143/60 (BP Location: Right Arm)   Pulse 83   Temp (!) 97.4 F (36.3 C) (Oral)   Resp 15   SpO2 100%  Physical Exam Vitals and nursing note reviewed.  HENT:     Head: Normocephalic.     Right Ear: External ear normal.     Left Ear: External ear normal.     Nose: Nose normal.     Mouth/Throat:     Pharynx: Oropharynx is clear.  Eyes:     Conjunctiva/sclera: Conjunctivae normal.     Pupils: Pupils are equal, round, and reactive to light.  Cardiovascular:     Rate and Rhythm: Normal rate.     Pulses: Normal pulses.  Pulmonary:     Effort: Pulmonary effort is normal.  Abdominal:     General: Abdomen is flat. Bowel sounds are normal.  Musculoskeletal:        General: Normal range of motion.     Cervical back: Normal range of motion.  Skin:     General: Skin is warm.     Capillary Refill: Capillary refill takes less than 2 seconds.  Neurological:     Mental Status: She is alert.     Comments: No visual field cut Some facial asymmetry Mild right arm drift Mild right leg drift  Psychiatric:        Mood and Affect: Mood normal.        Behavior: Behavior normal.     ED Results / Procedures / Treatments   Labs (all labs ordered are listed, but only abnormal results are displayed) Labs Reviewed  CBG MONITORING, ED  I-STAT CHEM 8, ED    EKG EKG Interpretation  Date/Time:  Monday November 09 2022 10:46:14 EST Ventricular Rate:  74 PR Interval:  156 QRS Duration: 79 QT Interval:  357 QTC Calculation: 396 R Axis:   -11 Text Interpretation: Sinus rhythm Confirmed by Pattricia Boss [phone]) on 11/09/2022 11:16:22 AM  Radiology MR BRAIN WO CONTRAST  Result Date: 11/08/2022 CLINICAL DATA:  62 year old female with headache and altered mental status. EXAM: MRI HEAD WITHOUT CONTRAST TECHNIQUE: Multiplanar, multiecho pulse sequences of the brain and surrounding structures were obtained without intravenous contrast. COMPARISON:  Head CT 0315 hours today. FINDINGS: Brain: Cerebral volume  is within normal limits for age. No convincing restricted diffusion. No midline shift, mass effect, evidence of mass lesion, ventriculomegaly, extra-axial collection or acute intracranial hemorrhage. Cervicomedullary junction and pituitary are within normal limits. Pearline Cables and white matter signal is within normal limits throughout the brain. No encephalomalacia or chronic cerebral blood products identified. Vascular: Major intracranial vascular flow voids are preserved. Skull and upper cervical spine: Negative visible cervical spine. Visualized bone marrow signal is within normal limits. Sinuses/Orbits: Negative orbits. Paranasal sinuses and mastoids appear clear. Other: Grossly negative visible internal auditory structures. Negative visible scalp and face.  IMPRESSION: No acute intracranial abnormality. Normal for age noncontrast MRI appearance of the Brain. Electronically Signed   By: Genevie Ann M.D.   On: 11/08/2022 11:08   CT Head Wo Contrast  Result Date: 11/08/2022 CLINICAL DATA:  Headache EXAM: CT HEAD WITHOUT CONTRAST TECHNIQUE: Contiguous axial images were obtained from the base of the skull through the vertex without intravenous contrast. RADIATION DOSE REDUCTION: This exam was performed according to the departmental dose-optimization program which includes automated exposure control, adjustment of the mA and/or kV according to patient size and/or use of iterative reconstruction technique. COMPARISON:  12/16/2020 FINDINGS: Brain: There is no mass, hemorrhage or extra-axial collection. The size and configuration of the ventricles and extra-axial CSF spaces are normal. The brain parenchyma is normal, without acute or chronic infarction. Vascular: No abnormal hyperdensity of the major intracranial arteries or dural venous sinuses. No intracranial atherosclerosis. Skull: The visualized skull base, calvarium and extracranial soft tissues are normal. Sinuses/Orbits: No fluid levels or advanced mucosal thickening of the visualized paranasal sinuses. No mastoid or middle ear effusion. The orbits are normal. IMPRESSION: Normal head CT. Electronically Signed   By: Ulyses Jarred M.D.   On: 11/08/2022 03:19    Procedures Procedures    Medications Ordered in ED Medications - No data to display  ED Course/ Medical Decision Making/ A&P                           Medical Decision Making 62 year old female with new neurological symptoms.  Last known normal 36 hours ago.  In the interim she has had labs and MRI. Discussed care with neurology who advised MRI and further workup. Discussed care with Dr. Tamala Julian, on-call for hospitalist he will see and admit for further evaluation  Amount and/or Complexity of Data Reviewed Radiology: ordered.  Risk Decision  regarding hospitalization.    Reviewed labs from 1231 with mild hyperglycemia otherwise within normal limits MRI reviewed from 1231 that showed no evidence of acute intracranial abnormalities       Final Clinical Impression(s) / ED Diagnoses Final diagnoses:  Right sided weakness    Rx / DC Orders ED Discharge Orders     None         Pattricia Boss, MD 11/09/22 1116

## 2022-11-09 NOTE — Plan of Care (Signed)

## 2022-11-09 NOTE — ED Provider Notes (Signed)
[facility] EMERGENCY DEPARTMENT Provider Note   CSN: [id number] Arrival date & time: 11/08/22  1947     History  Chief Complaint  Patient presents with   Numbness     Triniti Gruetzmacher is a 62 y.o. female.  HPI 62 year old female last known normal 36 hours ago with worsening symptoms last night at 5 PM.  Patient had some numbness to both of her hands and around her mouth on early Sunday morning.  She presented to [facility].  She was transferred to [facility] for MRI.  She had a negative MRI.  Her symptoms had not completely resolved but she was ambulatory and speaking without difficulty and was discharged home.  She arrived home around 3:00 yesterday.  Sometime yesterday late evening between 5 and 530 she began having difficulties with her speech and right-sided weakness.  Patient presented back to the ED due to these new symptoms.    Home Medications Prior to Admission medications   Medication Sig Start Date End Date Taking? Authorizing Provider  acetaminophen (TYLENOL) 500 MG tablet Take 1,000 mg by mouth every 6 (six) hours as needed for moderate pain.   Yes [provider]  rosuvastatin (CRESTOR) 10 MG tablet Take 10 mg by mouth daily. 10/18/22  Yes [provider]  VITAMIN D PO Take 1 tablet by mouth daily.   Yes [provider]      Allergies    Patient has no known allergies.    Review of Systems   Review of Systems  Physical Exam Updated Vital Signs BP (!) 130/57 (BP Location: Right Arm)   Pulse 68   Temp 98.2 F (36.8 C) (Oral)   Resp 17   Ht 1.575 m (5\' 2" )   Wt 86.2 kg   SpO2 98%   BMI 34.75 kg/m  Physical Exam Vitals and nursing note reviewed.  Constitutional:      General: She is not in acute distress.    Appearance: She is obese.     Comments: Patient is tearful  HENT:     Head: Normocephalic and atraumatic.     Right Ear: External ear normal.     Left Ear: External ear normal.     Nose:  Nose normal.     Mouth/Throat:     Pharynx: Oropharynx is clear.  Eyes:     Extraocular Movements: Extraocular movements intact.     Pupils: Pupils are equal, round, and reactive to light.  Cardiovascular:     Rate and Rhythm: Normal rate and regular rhythm.     Pulses: Normal pulses.     Heart sounds: Normal heart sounds.  Abdominal:     General: Abdomen is flat. Bowel sounds are normal.  Musculoskeletal:        General: Normal range of motion.     Cervical back: Normal range of motion.  Skin:    General: Skin is warm and dry.     Capillary Refill: Capillary refill takes less than 2 seconds.  Neurological:     Mental Status: She is alert.     Comments: Patient with facial asymmetry Patient has having word finding difficulty and speech hesitancy Some right sided arm weakness and right leg drift  Psychiatric:        Mood and Affect: Mood normal.     ED Results / Procedures / Treatments   Labs (all labs ordered are listed, but only abnormal results are displayed) Labs Reviewed  LIPID PANEL -  Abnormal; Notable for the following components:      Result Value   Cholesterol 208 (*)    LDL Cholesterol 139 (*)    All other components within normal limits  I-STAT CHEM 8, ED - Abnormal; Notable for the following components:   Glucose, Bld 105 (*)    Calcium, Ion 1.14 (*)    All other components within normal limits  HEMOGLOBIN A1C  CBG MONITORING, ED    EKG EKG Interpretation  Date/Time:  Monday November 09 2022 10:46:14 EST Ventricular Rate:  74 PR Interval:  156 QRS Duration: 79 QT Interval:  357 QTC Calculation: 396 R Axis:   -11 Text Interpretation: Sinus rhythm Confirmed by Pattricia Boss [phone]) on 11/09/2022 11:16:22 AM  Radiology CT ANGIO HEAD NECK W WO CM  Result Date: 11/09/2022 CLINICAL DATA:  Right-sided weakness and difficulty speaking. Acute lacunar infarcts of the central pons, left greater than right. EXAM: CT ANGIOGRAPHY HEAD AND NECK TECHNIQUE:  Multidetector CT imaging of the head and neck was performed using the standard protocol during bolus administration of intravenous contrast. Multiplanar CT image reconstructions and MIPs were obtained to evaluate the vascular anatomy. Carotid stenosis measurements (when applicable) are obtained utilizing NASCET criteria, using the distal internal carotid diameter as the denominator. RADIATION DOSE REDUCTION: This exam was performed according to the departmental dose-optimization program which includes automated exposure control, adjustment of the mA and/or kV according to patient size and/or use of iterative reconstruction technique. CONTRAST:  65mL OMNIPAQUE IOHEXOL 350 MG/ML SOLN COMPARISON:  MR head without and with contrast 11/09/2022 FINDINGS: CTA NECK FINDINGS Aortic arch: A 3 vessel arch configuration is present. Minimal calcifications are present in the proximal left subclavian artery. No significant stenosis or aneurysm is present. Right carotid system: The right common carotid artery is within normal limits. Minimal calcifications are present at the right carotid bifurcation. Subtle vessel wall irregularity is present in the mid cervical right ICA. No significant stenosis is present. Left carotid system: Left common carotid artery is within normal limits. Bifurcation is unremarkable. Subtle vessel wall irregularity is present in mid cervical left ICA without significant stenosis. Vertebral arteries: The right vertebral artery is dominant. Both vertebral arteries originate from the subclavian arteries without significant stenosis. No significant stenosis is present in either vertebral artery in the neck. Skeleton: Vertebral body heights and alignment are normal. No focal osseous lesions are present. Other neck: Soft tissues the neck are otherwise unremarkable. Salivary glands are within normal limits. Thyroid is normal. No significant adenopathy is present. No focal mucosal or submucosal lesions are  present. Upper chest: Patchy ground-glass attenuation is present at both lung apices. No nodule or mass lesion is present. No significant pleural disease is present. Review of the MIP images confirms the above findings CTA HEAD FINDINGS Anterior circulation: The internal carotid arteries are within normal limits from the high cervical segments through the ICA termini. The A1 and M1 segments are normal. The anterior communicating artery is patent. The MCA bifurcations are normal bilaterally. The ACA and MCA branch vessels are within normal limits bilaterally. No aneurysm is present. Posterior circulation: The right PICA origin is at the dural margin. Fenestration is present at the vertebrobasilar junction. No aneurysm is present. The basilar artery is normal. Both posterior cerebral arteries originate from the basilar tip. The PCA branch vessels are within normal limits bilaterally. Venous sinuses: The dural sinuses are patent. The straight sinus and deep cerebral veins are intact. Cortical veins are within normal limits. No significant  vascular malformation is evident. Anatomic variants: None Review of the MIP images confirms the above findings IMPRESSION: 1. No emergent large vessel occlusions. Normal variant CTA Circle of Willis without proximal stenosis, aneurysm or branch vessel occlusion. 2. Subtle vessel wall irregularity in the mid cervical internal carotid arteries bilaterally suggesting fibromuscular dysplasia. No significant stenosis is present. 3. Minimal calcifications at the right carotid bifurcation without significant stenosis. 4. Patchy ground-glass attenuation at both lung apices. This could represent atelectasis or edema. Infection is not excluded. The above was relayed via text pager to Dr. Lesleigh Noe on 11/09/2022 at 13:59. Electronically Signed   By: San Morelle M.D.   On: 11/09/2022 13:59   MR Brain W and Wo Contrast  Addendum Date: 11/09/2022   ADDENDUM REPORT: 11/09/2022 13:02  ADDENDUM: Study discussed by telephone with Dr. Tamala Julian on 11/09/2022 at 1259 hours. Electronically Signed   By: Genevie Ann M.D.   On: 11/09/2022 13:02   Result Date: 11/09/2022 CLINICAL DATA:  62 year old female with neurologic deficit initially presenting with headache and numbness yesterday. New onset right side weakness and difficulty speaking since evaluation yesterday. Unrevealing MRI yesterday prior to the new symptoms. EXAM: MRI HEAD WITHOUT AND WITH CONTRAST TECHNIQUE: Multiplanar, multiecho pulse sequences of the brain and surrounding structures were obtained without and with intravenous contrast. CONTRAST:  22mL GADAVIST GADOBUTROL 1 MMOL/ML IV SOLN COMPARISON:  Brain MRI 11/08/2022 and earlier. FINDINGS: Brain: Patchy and linear restricted diffusion now in the left paracentral pons in an area of about 12 mm. But also subtle contralateral right central pontine restricted diffusion. See series 3, image 17. This is basilar artery perforator branch territory, and these were not apparent yesterday. Associated new T2 heterogeneity within the pons. No associated hemorrhage, significant pontine expansion, or mass effect. Only subtle FLAIR hyperintensity in the left central pons at this time (series 6, image 11). No abnormal enhancement identified. No other restricted diffusion. No midline shift, mass effect, evidence of mass lesion, ventriculomegaly, extra-axial collection or acute intracranial hemorrhage. Cervicomedullary junction and pituitary are within normal limits. Background cerebral volume remains normal. Outside of the brainstem gray and white matter signal is stable and normal for age. No cortical encephalomalacia or chronic cerebral blood products identified. No dural thickening. Vascular: Major intracranial vascular flow voids are stable. Following contrast the major dural venous sinuses are enhancing and appear to be patent. And furthermore, Skull and upper cervical spine: Visible cervical spine remains  normal. Visualized bone marrow signal is within normal limits. Sinuses/Orbits: Stable and negative. Other: Visible internal auditory structures appear normal. Negative visible scalp and face. IMPRESSION: 1. 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 No associated hemorrhage or mass effect. 2. Elsewhere stable and normal for age MRI appearance of the brain. Electronically Signed: By: Genevie Ann M.D. On: 11/09/2022 12:52   MR BRAIN WO CONTRAST  Result Date: 11/08/2022 CLINICAL DATA:  62 year old female with headache and altered mental status. EXAM: MRI HEAD WITHOUT CONTRAST TECHNIQUE: Multiplanar, multiecho pulse sequences of the brain and surrounding structures were obtained without intravenous contrast. COMPARISON:  Head CT 0315 hours today. FINDINGS: Brain: Cerebral volume is within normal limits for age. No convincing restricted diffusion. No midline shift, mass effect, evidence of mass lesion, ventriculomegaly, extra-axial collection or acute intracranial hemorrhage. Cervicomedullary junction and pituitary are within normal limits. Pearline Cables and white matter signal is within normal limits throughout the brain. No encephalomalacia or chronic cerebral blood products identified. Vascular: Major intracranial vascular  flow voids are preserved. Skull and upper cervical spine: Negative visible cervical spine. Visualized bone marrow signal is within normal limits. Sinuses/Orbits: Negative orbits. Paranasal sinuses and mastoids appear clear. Other: Grossly negative visible internal auditory structures. Negative visible scalp and face. IMPRESSION: No acute intracranial abnormality. Normal for age noncontrast MRI appearance of the Brain. Electronically Signed   By: Genevie Ann M.D.   On: 11/08/2022 11:08   CT Head Wo Contrast  Result Date: 11/08/2022 CLINICAL DATA:  Headache EXAM: CT HEAD WITHOUT CONTRAST TECHNIQUE: Contiguous axial images were obtained from the base of the skull  through the vertex without intravenous contrast. RADIATION DOSE REDUCTION: This exam was performed according to the departmental dose-optimization program which includes automated exposure control, adjustment of the mA and/or kV according to patient size and/or use of iterative reconstruction technique. COMPARISON:  12/16/2020 FINDINGS: Brain: There is no mass, hemorrhage or extra-axial collection. The size and configuration of the ventricles and extra-axial CSF spaces are normal. The brain parenchyma is normal, without acute or chronic infarction. Vascular: No abnormal hyperdensity of the major intracranial arteries or dural venous sinuses. No intracranial atherosclerosis. Skull: The visualized skull base, calvarium and extracranial soft tissues are normal. Sinuses/Orbits: No fluid levels or advanced mucosal thickening of the visualized paranasal sinuses. No mastoid or middle ear effusion. The orbits are normal. IMPRESSION: Normal head CT. Electronically Signed   By: Ulyses Jarred M.D.   On: 11/08/2022 03:19    Procedures Procedures    Medications Ordered in ED Medications   stroke: early stages of recovery book (has no administration in time range)  clopidogrel (PLAVIX) tablet 75 mg (has no administration in time range)  aspirin EC tablet 81 mg (81 mg Oral Given 11/09/22 1540)  atorvastatin (LIPITOR) tablet 80 mg (80 mg Oral Given 11/09/22 1540)  gadobutrol (GADAVIST) 1 MMOL/ML injection 9 mL (9 mLs Intravenous Contrast Given 11/09/22 1220)  clopidogrel (PLAVIX) tablet 300 mg (300 mg Oral Given 11/09/22 1540)  iohexol (OMNIPAQUE) 350 MG/ML injection 75 mL (75 mLs Intravenous Contrast Given 11/09/22 1342)    ED Course/ Medical Decision Making/ A&P                           Medical Decision Making Amount and/or Complexity of Data Reviewed Radiology: ordered.  Risk Prescription drug management. Decision regarding hospitalization.   62 yo female past medical history significant for  hypercholesterolemia.  She began having some paresthesias in bilateral arms and perioral approximately 36 hours ago.  She was seen and evaluated with CT and sent to [facility] for MRI.  There is no evidence of acute stroke.  Patient was discharged yesterday afternoon around 3 PM and felt like she was stable.  She got home and around 5-5 30 she began having new right-sided weakness with difficulty speaking.  She returned to the ED for further evaluation. On my exam here she has some mild right arm drift and leg drift with facial asymmetry and word hesitancy with history no visual deficits Unable to understand her speech.  The symptoms worsened approximately 17 hours ago and last known normal is 36 hours the patient is outside the stroke window stroke score is approximately 3 Care discussed with Dr. Shirlean Schlein who advises MRI with and without contrast Patient's renal function was normal She also will need to have EEG if symptoms continue Plan admission for ongoing evaluation and treatment        Final Clinical Impression(s) / ED  Diagnoses Final diagnoses:  Right sided weakness    Rx / DC Orders ED Discharge Orders     None         Pattricia Boss, MD 11/09/22 1550

## 2022-11-09 NOTE — Plan of Care (Signed)
  Problem: Education: Goal: Knowledge of disease or condition will improve Outcome: Progressing   

## 2022-11-10 ENCOUNTER — Inpatient Hospital Stay (HOSPITAL_COMMUNITY): Payer: 59

## 2022-11-10 DIAGNOSIS — I6389 Other cerebral infarction: Secondary | ICD-10-CM

## 2022-11-10 DIAGNOSIS — I639 Cerebral infarction, unspecified: Secondary | ICD-10-CM | POA: Diagnosis not present

## 2022-11-10 LAB — ECHOCARDIOGRAM COMPLETE
Area-P 1/2: 3.15 cm2
Calc EF: 63.9 %
Height: 62 in
MV VTI: 2.17 cm2
S' Lateral: 2.7 cm
Single Plane A2C EF: 62.9 %
Single Plane A4C EF: 65.1 %
Weight: 3039.88 oz

## 2022-11-10 LAB — HEMOGLOBIN A1C
Hgb A1c MFr Bld: 6.1 % — ABNORMAL HIGH (ref 4.8–5.6)
Mean Plasma Glucose: 128 mg/dL

## 2022-11-10 MED ORDER — ENOXAPARIN SODIUM 40 MG/0.4ML IJ SOSY
40.0000 mg | PREFILLED_SYRINGE | INTRAMUSCULAR | Status: DC
  Administered 2022-11-10 – 2022-11-13 (×4): 40 mg via SUBCUTANEOUS
  Filled 2022-11-10 (×4): qty 0.4

## 2022-11-10 NOTE — Progress Notes (Signed)
PROGRESS NOTE  Joy Shelton  IEP:[id number] DOB: 01-14-1961 DOA: 11/08/2022 PCP: Laurann Montana, MD   Brief Narrative: Patient is a 62 year old female with history of hyperlipidemia who presented here with right-sided weakness, slurred speech.  She was earlier seen in the ED with complaints of numbness/tingling on bilateral hands but at that time MRI was negative and she was discharged home.  Came back to the ED with weakness on the right side.  Speech was slurred.  Stroke workup initiated.  MRI showed acute lacunar infarct in the central pons left greater than right.  PT/OT recommending acute inpatient rehab.  Neurology following.  Assessment & Plan:  Principal Problem:   CVA (cerebral vascular accident) (HCC) Active Problems:   Hyperlipidemia   Obesity (BMI 30-39.9)  Acute ischemic stroke: Initially presented with numbness on bilateral upper extremities but at the time MRI was negative so she was discharged home.  Came back with right-sided weakness, slurred speech.   MRI showed acute lacunar infarct in the central pons left greater than right.  PT/OT recommending acute inpatient rehab.  Neurology following. Started on aspirin, Plavix.  Continue statin. CT angiogram did not show any large vessel occlusion.  Echo showed EF of 60 to 60%, normal diastolic parameters, no intracardiac source of emboli.  A1c of 6.1.  LDL of 139.  Hyperlipidemia: LDL of 139.  Continue Lipitor 80 mg daily.  Obesity: BMI 34.7.       DVT prophylaxis:Lovenox     Code Status: Full Code  Family Communication: Family member at bedside  Patient status: Inpatient  Patient is from : Home  Anticipated discharge to: Acute inpatient rehab  Estimated DC date: Not sure of   Consultants: Neurology  Procedures: None  Antimicrobials:  Anti-infectives (From admission, onward)    None       Subjective: Patient seen and examined at bedside today.  Hemodynamically stable.  Her speech   was clear.   She does not complain of any weakness on any extremities but might have generalized weakness.  Alert oriented  Objective: Vitals:   11/10/22 0012 11/10/22 0419 11/10/22 0723 11/10/22 1216  BP: 131/88 129/75 126/71 119/69  Pulse: 62 65 75 77  Resp: 12 13 18 17   Temp: (!) 97.4 F (36.3 C)  97.8 F (36.6 C) 98.4 F (36.9 C)  TempSrc: Oral Oral Oral Oral  SpO2: 97% 97% 99% 97%  Weight:      Height:        Intake/Output Summary (Last 24 hours) at 11/10/2022 1302 Last data filed at 11/10/2022 0500 Gross per 24 hour  Intake 480 ml  Output 900 ml  Net -420 ml   Filed Weights   11/09/22 1544  Weight: 86.2 kg    Examination:  General exam: Overall comfortable, not in distress,obese HEENT: PERRL Respiratory system:  no wheezes or crackles  Cardiovascular system: S1 & S2 heard, RRR.  Gastrointestinal system: Abdomen is nondistended, soft and nontender. Central nervous system: Alert and oriented Extremities: No edema, no clubbing ,no cyanosis Skin: No rashes, no ulcers,no icterus     Data Reviewed: I have personally reviewed following labs and imaging studies  CBC: Recent Labs  Lab 11/08/22 0210 11/09/22 1314  WBC 9.9  --   NEUTROABS 6.5  --   HGB 15.0 13.6  HCT 45.3 40.0  MCV 85.0  --   PLT 373  --    Basic Metabolic Panel: Recent Labs  Lab 11/08/22 0210 11/09/22 1314  NA 141 142  K  3.9 3.7  CL 106 108  CO2 28  --   GLUCOSE 127* 105*  BUN 11 9  CREATININE 0.77 0.60  CALCIUM 9.1  --      No results found for this or any previous visit (from the past 240 hour(s)).   Radiology Studies: ECHOCARDIOGRAM COMPLETE  Result Date: 11/10/2022    ECHOCARDIOGRAM REPORT   Patient Name:   Joy Shelton Date of Exam: 11/10/2022 Medical Rec #:  [id number]        Height:       62.0 in Accession #:    [id number]       Weight:       190.0 lb Date of Birth:  [birth year]        BSA:          1.870 m Patient Age:    40 years         BP:           126/71 mmHg Patient Gender: F                 HR:           59 bpm. Exam Location:  Inpatient Procedure: 2D Echo, Color Doppler and Cardiac Doppler Indications:    Stroke  History:        Patient has no prior history of Echocardiogram examinations.                 Risk Factors:Obesity. Hx of hypercholesteremia.  Sonographer:    Eartha Inch Referring Phys: [id number] RONDELL A SMITH  Sonographer Comments: Image acquisition challenging due to patient body habitus and Image acquisition challenging due to respiratory motion. IMPRESSIONS  1. Left ventricular ejection fraction, by estimation, is 60 to 65%. The left ventricle has normal function. The left ventricle has no regional wall motion abnormalities. Left ventricular diastolic parameters were normal.  2. Right ventricular systolic function is normal. The right ventricular size is normal.  3. The mitral valve is normal in structure. No evidence of mitral valve regurgitation. No evidence of mitral stenosis.  4. The aortic valve is normal in structure. Aortic valve regurgitation is not visualized. No aortic stenosis is present.  5. The inferior vena cava is normal in size with greater than 50% respiratory variability, suggesting right atrial pressure of 3 mmHg. FINDINGS  Left Ventricle: Left ventricular ejection fraction, by estimation, is 60 to 65%. The left ventricle has normal function. The left ventricle has no regional wall motion abnormalities. The left ventricular internal cavity size was normal in size. There is  no left ventricular hypertrophy. Left ventricular diastolic parameters were normal. Right Ventricle: The right ventricular size is normal. No increase in right ventricular wall thickness. Right ventricular systolic function is normal. Left Atrium: Left atrial size was normal in size. Right Atrium: Right atrial size was normal in size. Pericardium: There is no evidence of pericardial effusion. Mitral Valve: The mitral valve is normal in structure. No evidence of mitral valve  regurgitation. No evidence of mitral valve stenosis. MV peak gradient, 3.3 mmHg. The mean mitral valve gradient is 1.0 mmHg. Tricuspid Valve: The tricuspid valve is normal in structure. Tricuspid valve regurgitation is not demonstrated. No evidence of tricuspid stenosis. Aortic Valve: The aortic valve is normal in structure. Aortic valve regurgitation is not visualized. No aortic stenosis is present. Pulmonic Valve: The pulmonic valve was normal in structure. Pulmonic valve regurgitation is not visualized. No evidence of pulmonic stenosis. Aorta: The aortic root is normal  in size and structure. Venous: The inferior vena cava is normal in size with greater than 50% respiratory variability, suggesting right atrial pressure of 3 mmHg. IAS/Shunts: No atrial level shunt detected by color flow Doppler.  LEFT VENTRICLE PLAX 2D LVIDd:         3.90 cm     Diastology LVIDs:         2.70 cm     LV e' medial:    6.99 cm/s LV PW:         0.90 cm     LV E/e' medial:  14.0 LV IVS:        0.70 cm     LV e' lateral:   9.17 cm/s LVOT diam:     1.80 cm     LV E/e' lateral: 10.7 LV SV:         65 LV SV Index:   35 LVOT Area:     2.54 cm  LV Volumes (MOD) LV vol d, MOD A2C: 40.7 ml LV vol d, MOD A4C: 73.7 ml LV vol s, MOD A2C: 15.1 ml LV vol s, MOD A4C: 25.7 ml LV SV MOD A2C:     25.6 ml LV SV MOD A4C:     73.7 ml LV SV MOD BP:      35.6 ml RIGHT VENTRICLE             IVC RV S prime:     10.70 cm/s  IVC diam: 1.50 cm TAPSE (M-mode): 1.9 cm LEFT ATRIUM             Index        RIGHT ATRIUM           Index LA diam:        3.50 cm 1.87 cm/m   RA Area:     11.80 cm LA Vol (A2C):   27.5 ml 14.70 ml/m  RA Volume:   23.40 ml  12.51 ml/m LA Vol (A4C):   31.6 ml 16.89 ml/m LA Biplane Vol: 30.1 ml 16.09 ml/m  AORTIC VALVE LVOT Vmax:   109.00 cm/s LVOT Vmean:  80.400 cm/s LVOT VTI:    0.257 m  AORTA Ao Root diam: 2.70 cm Ao Asc diam:  2.90 cm MITRAL VALVE MV Area (PHT): 3.15 cm    SHUNTS MV Area VTI:   2.17 cm    Systemic VTI:  0.26 m MV  Peak grad:  3.3 mmHg    Systemic Diam: 1.80 cm MV Mean grad:  1.0 mmHg MV Vmax:       0.90 m/s MV Vmean:      54.7 cm/s MV Decel Time: 241 msec MV E velocity: 98.00 cm/s MV A velocity: 70.80 cm/s MV E/A ratio:  1.38 Mihai Croitoru MD Electronically signed by Thurmon Fair MD Signature Date/Time: 11/10/2022/9:13:50 AM    Final    CT ANGIO HEAD NECK W WO CM  Result Date: 11/09/2022 CLINICAL DATA:  Right-sided weakness and difficulty speaking. Acute lacunar infarcts of the central pons, left greater than right. EXAM: CT ANGIOGRAPHY HEAD AND NECK TECHNIQUE: Multidetector CT imaging of the head and neck was performed using the standard protocol during bolus administration of intravenous contrast. Multiplanar CT image reconstructions and MIPs were obtained to evaluate the vascular anatomy. Carotid stenosis measurements (when applicable) are obtained utilizing NASCET criteria, using the distal internal carotid diameter as the denominator. RADIATION DOSE REDUCTION: This exam was performed according to the departmental dose-optimization program which includes automated exposure control, adjustment  of the mA and/or kV according to patient size and/or use of iterative reconstruction technique. CONTRAST:  81mL OMNIPAQUE IOHEXOL 350 MG/ML SOLN COMPARISON:  MR head without and with contrast 11/09/2022 FINDINGS: CTA NECK FINDINGS Aortic arch: A 3 vessel arch configuration is present. Minimal calcifications are present in the proximal left subclavian artery. No significant stenosis or aneurysm is present. Right carotid system: The right common carotid artery is within normal limits. Minimal calcifications are present at the right carotid bifurcation. Subtle vessel wall irregularity is present in the mid cervical right ICA. No significant stenosis is present. Left carotid system: Left common carotid artery is within normal limits. Bifurcation is unremarkable. Subtle vessel wall irregularity is present in mid cervical left ICA  without significant stenosis. Vertebral arteries: The right vertebral artery is dominant. Both vertebral arteries originate from the subclavian arteries without significant stenosis. No significant stenosis is present in either vertebral artery in the neck. Skeleton: Vertebral body heights and alignment are normal. No focal osseous lesions are present. Other neck: Soft tissues the neck are otherwise unremarkable. Salivary glands are within normal limits. Thyroid is normal. No significant adenopathy is present. No focal mucosal or submucosal lesions are present. Upper chest: Patchy ground-glass attenuation is present at both lung apices. No nodule or mass lesion is present. No significant pleural disease is present. Review of the MIP images confirms the above findings CTA HEAD FINDINGS Anterior circulation: The internal carotid arteries are within normal limits from the high cervical segments through the ICA termini. The A1 and M1 segments are normal. The anterior communicating artery is patent. The MCA bifurcations are normal bilaterally. The ACA and MCA branch vessels are within normal limits bilaterally. No aneurysm is present. Posterior circulation: The right PICA origin is at the dural margin. Fenestration is present at the vertebrobasilar junction. No aneurysm is present. The basilar artery is normal. Both posterior cerebral arteries originate from the basilar tip. The PCA branch vessels are within normal limits bilaterally. Venous sinuses: The dural sinuses are patent. The straight sinus and deep cerebral veins are intact. Cortical veins are within normal limits. No significant vascular malformation is evident. Anatomic variants: None Review of the MIP images confirms the above findings IMPRESSION: 1. No emergent large vessel occlusions. Normal variant CTA Circle of Willis without proximal stenosis, aneurysm or branch vessel occlusion. 2. Subtle vessel wall irregularity in the mid cervical internal carotid  arteries bilaterally suggesting fibromuscular dysplasia. No significant stenosis is present. 3. Minimal calcifications at the right carotid bifurcation without significant stenosis. 4. Patchy ground-glass attenuation at both lung apices. This could represent atelectasis or edema. Infection is not excluded. The above was relayed via text pager to Dr. Brooke Dare on 11/09/2022 at 13:59. Electronically Signed   By: Marin Roberts M.D.   On: 11/09/2022 13:59   MR Brain W and Wo Contrast  Addendum Date: 11/09/2022   ADDENDUM REPORT: 11/09/2022 13:02 ADDENDUM: Study discussed by telephone with Dr. Katrinka Blazing on 11/09/2022 at 1259 hours. Electronically Signed   By: Odessa Fleming M.D.   On: 11/09/2022 13:02   Result Date: 11/09/2022 CLINICAL DATA:  62 year old female with neurologic deficit initially presenting with headache and numbness yesterday. New onset right side weakness and difficulty speaking since evaluation yesterday. Unrevealing MRI yesterday prior to the new symptoms. EXAM: MRI HEAD WITHOUT AND WITH CONTRAST TECHNIQUE: Multiplanar, multiecho pulse sequences of the brain and surrounding structures were obtained without and with intravenous contrast. CONTRAST:  59mL GADAVIST GADOBUTROL 1 MMOL/ML IV SOLN COMPARISON:  Brain MRI 11/08/2022 and earlier. FINDINGS: Brain: Patchy and linear restricted diffusion now in the left paracentral pons in an area of about 12 mm. But also subtle contralateral right central pontine restricted diffusion. See series 3, image 17. This is basilar artery perforator branch territory, and these were not apparent yesterday. Associated new T2 heterogeneity within the pons. No associated hemorrhage, significant pontine expansion, or mass effect. Only subtle FLAIR hyperintensity in the left central pons at this time (series 6, image 11). No abnormal enhancement identified. No other restricted diffusion. No midline shift, mass effect, evidence of mass lesion, ventriculomegaly, extra-axial  collection or acute intracranial hemorrhage. Cervicomedullary junction and pituitary are within normal limits. Background cerebral volume remains normal. Outside of the brainstem gray and white matter signal is stable and normal for age. No cortical encephalomalacia or chronic cerebral blood products identified. No dural thickening. Vascular: Major intracranial vascular flow voids are stable. Following contrast the major dural venous sinuses are enhancing and appear to be patent. And furthermore, Skull and upper cervical spine: Visible cervical spine remains normal. Visualized bone marrow signal is within normal limits. Sinuses/Orbits: Stable and negative. Other: Visible internal auditory structures appear normal. Negative visible scalp and face. IMPRESSION: 1. 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 No associated hemorrhage or mass effect. 2. Elsewhere stable and normal for age MRI appearance of the brain. Electronically Signed: By: Genevie Ann M.D. On: 11/09/2022 12:52    Scheduled Meds:  aspirin EC  81 mg Oral Daily   atorvastatin  80 mg Oral Daily   clopidogrel  75 mg Oral Daily   Continuous Infusions:   LOS: 1 day   Shelly Coss, MD [facility] P1/12/2022, 1:02 PM

## 2022-11-10 NOTE — Progress Notes (Signed)
SLEEP SMART STROKE RESEARCH STUDY NOTE   Patient is participating in  Sleep Smart Stroke prevention study (  CPAP treatment versus no treatment in stroke patients with sleep apnoea ).  Patient was given study material to review and informed consent form at 12:50 PM.  Patient was advised to take as much time as she wanted to review the consent form and encouraged to ask questions which were answered.  Patient's daughter and husband were also present at the bedside and were also encouraged to review the material and ask questions.  It was made clear to the patient that study participation is voluntary and patient will still get the same excellent standard of care irrespective of whether she chooses to participate in the study or not.  The benefits of participation in the study as well as the risk involved including but not limited to dsicomfort from wearing CPAP mask as well as alternatives to not participating in the study were clearly discussed with the patient and family who expressed understanding.  The patient was clearly informed that patient has no obligation to's stay in the study for the entire duration and is free to discontinue study medication or study participation if she is dissatisfied at any time in the future.  The research study office visit scheduled was explained to the patient and study obligations were clearly explained as well.  Patient voiced understanding and willingness to participate in the study.  The study inclusion exclusion criteria reviewed by me personally as well as study coordinator and verified that patient met all criteria.  Patient signed informed consent with study coordinator in my presence and 3 PM.    No study specific procedure was done prior to patient signing informed consent form.  A copy of informed consent form and patient [employer] of Rights signed by the patient was given to her.  Antony Contras, MD

## 2022-11-10 NOTE — Progress Notes (Signed)
Inpatient Rehab Admissions Coordinator Note:   Per PT recommendations patient was screened for CIR candidacy by Michel Santee, PT. At this time, pt appears to be a potential candidate for CIR. I will place an order for rehab consult for full assessment, per our protocol.  Please contact me any with questions.Shann Medal, PT, DPT [phone] 11/10/22 11:09 AM

## 2022-11-10 NOTE — Plan of Care (Signed)

## 2022-11-10 NOTE — PMR Pre-admission (Signed)
PMR Admission Coordinator Pre-Admission Assessment  Patient: Joy Shelton is an 62 y.o., female MRN: [id number] DOB: [birth year] Height: _0  (157.5 cm) Weight: 86.2 kg  Insurance Information HMO:     PPO:      PCP:      IPA:      80/20:      OTHER:  PRIMARY: U[employer]      Policy#: [id number]      Subscriber: patient CM Name: Tish      Phone#: [phone]     Fax#: [phone] Pre-Cert#: [id number]  approved for 6 days f/u with Opal Sidles phone [phone] fax [phone]    Employer:  Benefits:  Phone #: [phone]     Name: 1/3 Eff. Date: 11/09/22     Deduct: $3200      Out of Pocket Max: [phone]      Life Max: none CIR: 90%      SNF: 90% 120 day limit per year Outpatient: 90%      Co-Pay: 20 visits combined Home Health: 90%      Co-Pay: 60 visits combined DME: 90%     Co-Pay: 10% Providers: in-network  SECONDARY:  none   Financial Counselor:       Phone#:   The Engineer, petroleum" for patients in Inpatient Rehabilitation Facilities with attached "Privacy Act [place] Records" was provided and verbally reviewed with: N/A  Emergency Contact Information Contact Information     Name Relation Home Work Mobile   [place] Sister [phone]  [phone]      Current Medical History  Patient Admitting Diagnosis: CVA  History of Present Illness: Pt is a 62 year old female with medical hx significant for: HLD. Pt presented to [facility] ED on 11/08/22 due to complaints of headache and arm numbness. CT head showed no acute abnormalities. Pt transferred to [facility] for further workup. Workup completed and MRI negative for acute abnormalities so pt discharged home.  Patient returned to [facility] ED on 11/08/22 due to right-sided weakness and difficulty talking. MRI revealed acute lacunar infarcts in central pons, left greater than right. Felt likely secondary to small vessel disease.  CTA showed no LVO. 2 D echo 60 to 65%, no atrial  level shunt detected. LDL 139. Rosuvastatin at home changed to atorvastatin.  Lovenox for DVT prophylaxis. No antithrombotic prior to admit, now on Asa and clopidogrel daily for 3 weeks to be followed by ASA for monotherapy. Enrolled in the sleep smart stroke prevention study.  Complete NIHSS TOTAL: 0  Patient's medical record from [facility] has been reviewed by the rehabilitation admission coordinator and physician.  Past Medical History  Past Medical History:  Diagnosis Date   Hypercholesteremia    Has the patient had major surgery during 100 days prior to admission? No  Family History   family history is not on file.  Current Medications  Current Facility-Administered Medications:    aspirin EC tablet 81 mg, 81 mg, Oral, Daily, Smith, Rondell A, MD, 81 mg at 11/13/22 1025   clopidogrel (PLAVIX) tablet 75 mg, 75 mg, Oral, Daily, Smith, Rondell A, MD, 75 mg at 11/13/22 1025   enoxaparin (LOVENOX) injection 40 mg, 40 mg, Subcutaneous, Q24H, Adhikari, Amrit, MD, 40 mg at 11/12/22 1503   loperamide (IMODIUM) capsule 2 mg, 2 mg, Oral, Q6H PRN, Tawanna Solo, Amrit, MD, 2 mg at 11/11/22 1652   pantoprazole (PROTONIX) EC tablet 40 mg, 40 mg, Oral, Q0600, Rai, Ripudeep K, MD, 40 mg at  11/13/22 1025   rosuvastatin (CRESTOR) tablet 40 mg, 40 mg, Oral, Daily, Rai, Ripudeep K, MD, 40 mg at 11/13/22 1025  Patients Current Diet:  Diet Order             Diet Heart Room service appropriate? Yes; Fluid consistency: Thin  Diet effective now                  Precautions / Restrictions Precautions Precautions: Fall Restrictions Weight Bearing Restrictions: No   Has the patient had 2 or more falls or a fall with injury in the past year? No  Prior Activity Level Community (5-7x/wk): works, drives, gets out of house ~5-6 days/week  Prior Functional Level Self Care: Did the patient need help bathing, dressing, using the toilet or eating? Independent  Indoor Mobility: Did the  patient need assistance with walking from room to room (with or without device)? Independent  Stairs: Did the patient need assistance with internal or external stairs (with or without device)? Independent  Functional Cognition: Did the patient need help planning regular tasks such as shopping or remembering to take medications? Independent  Patient Information Are you of Hispanic, Latino/a,or Spanish origin?: A. No, not of Hispanic, Latino/a, or Spanish origin What is your race?: A. White Do you need or want an interpreter to communicate with a doctor or health care staff?: 0. No  Patient's Response To:  Health Literacy and Transportation Is the patient able to respond to health literacy and transportation needs?: Yes Health Literacy - How often do you need to have someone help you when you read instructions, pamphlets, or other written material from your doctor or pharmacy?: Never In the past 12 months, has lack of transportation kept you from medical appointments or from getting medications?: No In the past 12 months, has lack of transportation kept you from meetings, work, or from getting things needed for daily living?: No  [place] Devices/Equipment: Radio producer (specify quad or straight), Wheelchair, Environmental consultant (specify type) Home Equipment: Conservation officer, nature (2 wheels), [employer] - single point (belong to her father)  Prior Device Use: Indicate devices/aids used by the patient prior to current illness, exacerbation or injury? None of the above  Current Functional Level Cognition  Arousal/Alertness: Awake/alert Overall Cognitive Status: Within Functional Limits for tasks assessed Orientation Level: Oriented X4 Attention: Sustained, Selective Sustained Attention: Appears intact Selective Attention: Appears intact Memory: Appears intact Awareness: Appears intact Problem Solving: Appears intact Comments: stated able to use cell phone (in lieu of office  phone) when dad brings charger for her    Extremity Assessment (includes Sensation/Coordination)  Upper Extremity Assessment: RUE deficits/detail RUE Deficits / Details: AROM and strength WFL; decreased fine motor, in-hand manipulation skills, making hand "clumsy" RUE Coordination: decreased fine motor  Lower Extremity Assessment: Defer to PT evaluation RLE Deficits / Details: knee extension 4/5 RLE Sensation: WNL RLE Coordination: WNL    ADLs  Overall ADL's : Needs assistance/impaired Eating/Feeding: Set up, Supervision/ safety, Sitting Grooming: Set up, Supervision/safety, Sitting Upper Body Bathing: Set up, Supervision/ safety, Sitting Lower Body Bathing: Minimal assistance Upper Body Dressing : Set up, Supervision/safety, Sitting Lower Body Dressing: Minimal assistance, Sit to/from stand Toilet Transfer: Minimal assistance, Ambulation Toileting- Clothing Manipulation and Hygiene: Minimal assistance Functional mobility during ADLs: Minimal assistance General ADL Comments: very rigid posture adn shuffling gait; unable to bend forward to retrienve any object at knee level; complains of dizziness with bending forward    Mobility  Overal bed mobility: Needs  Assistance Bed Mobility: Supine to Sit, Sit to Supine Supine to sit: Supervision Sit to supine: Supervision General bed mobility comments: incr time and effort, HOB flat no rail    Transfers  Overall transfer level: Needs assistance Equipment used: Rolling walker (2 wheels) Transfers: Sit to/from Stand Sit to Stand: Min guard General transfer comment: requires verbal cues for safe hand placement to prevent pt from pulling up from RW.    Ambulation / Gait / Stairs / Wheelchair Mobility  Ambulation/Gait Ambulation/Gait assistance: Herbalist (Feet): 150 Feet Assistive device: Rolling walker (2 wheels) Gait Pattern/deviations: Step-through pattern, Decreased stride length, Drifts right/left General Gait  Details: very poor foot clearance with shuffling gait initially; improved with vc to improve foot clearance (emphasizing heelstrike); very hesitant and slow. Still drifting to her left, but pt able to maneuver RW without assist. Gait velocity interpretation: <1.8 ft/sec, indicate of risk for recurrent falls    Posture / Balance Balance Overall balance assessment: Needs assistance Sitting-balance support: No upper extremity supported, Feet supported Sitting balance-Leahy Scale: Good Standing balance support: Bilateral upper extremity supported, No upper extremity supported, Reliant on assistive device for balance Standing balance-Leahy Scale: Poor Standing balance comment: requires/seeks UE support High level balance activites: Other (comment) High Level Balance Comments: performed rhomberg standing (EO, EC), semi tandem standing and SLS. Pt with incr sway with rhomberg (especially with EC) but held x 10 seconds. Semi-tandem with each leg forward with slight imbalance that pt recovered x 10 sec. SLS on each leg with min assist to maintain x 10 seconds Standardized Balance Assessment Standardized Balance Assessment : Berg Balance Test Berg Balance Test Sit to Stand: Needs minimal aid to stand or to stabilize Standing Unsupported: Able to stand 2 minutes with supervision Sitting with Back Unsupported but Feet Supported on Floor or Stool: Able to sit safely and securely 2 minutes Stand to Sit: Sits safely with minimal use of hands Transfers: Needs one person to assist Standing Unsupported with Eyes Closed: Able to stand 10 seconds with supervision Standing Ubsupported with Feet Together: Able to place feet together independently and stand for 1 minute with supervision From Standing, Reach Forward with Outstretched Arm: Reaches forward but needs supervision From Standing Position, Pick up Object from Floor: Unable to pick up and needs supervision From Standing Position, Turn to Look Behind Over  each Shoulder: Needs supervision when turning Turn 360 Degrees: Needs assistance while turning Standing Unsupported, Alternately Place Feet on Step/Stool: Needs assistance to keep from falling or unable to try Standing Unsupported, One Foot in Front: Loses balance while stepping or standing Standing on One Leg: Unable to try or needs assist to prevent fall Total Score: 22    Special needs/care consideration Enrolled in sleep smart stroke prevention study   Previous Home Environment  Living Arrangements: Alone  Lives With:  (alone) Available Help at Discharge: Family, Available 24 hours/day Type of Home: House Home Layout: One level Home Access: Stairs to enter Entrance Stairs-Rails: None Entrance Stairs-Number of Steps: 1 Bathroom Shower/Tub: Chiropodist: Handicapped height Bathroom Accessibility: Yes How Accessible: Accessible via walker Home Care Services: No Additional Comments: above information for her home; may go stay with father as well as has sister, Shirlean Mylar to assist  Discharge Living Setting Plans for Discharge Living Setting: Patient's home Type of Home at Discharge: House Discharge Home Layout: One level Discharge Home Access: Stairs to enter Entrance Stairs-Rails: None Entrance Stairs-Number of Steps: 1 Discharge Bathroom Shower/Tub: Tub/shower unit Discharge Bathroom  Toilet: Handicapped height Discharge Bathroom Accessibility: Yes How Accessible: Accessible via walker Does the patient have any problems obtaining your medications?: No  Social/Family/Support Systems Anticipated Caregiver: Robon Platner, sister and/or pt's father Anticipated Caregiver's Contact Information: Shirlean Mylar: [phone]  Goals Patient/Family Goal for Rehab: Supervision-Mod I:PT/OT Expected length of stay: 5-7 days  Decrease burden of Care through IP rehab admission: NA  Possible need for SNF placement upon discharge: Not anticipated  Patient Condition: I have  reviewed medical records from [facility], spoken with CM, and patient. I met with patient at the bedside for inpatient rehabilitation assessment.  Patient will benefit from ongoing PT and OT, can actively participate in 3 hours of therapy a day 5 days of the week, and can make measurable gains during the admission.  Patient will also benefit from the coordinated team approach during an Inpatient Acute Rehabilitation admission.  The patient will receive intensive therapy as well as Rehabilitation physician, nursing, social worker, and care management interventions.  Due to bladder management, safety, disease management, medication administration, and patient education the patient requires 24 hour a day rehabilitation nursing.  The patient is currently min assist with mobility and basic ADLs.  Discharge setting and therapy post discharge at home with home health is anticipated.  Patient has agreed to participate in the Acute Inpatient Rehabilitation Program and will admit today.  Preadmission Screen Completed By: Gayland Curry with updates by Danne Baxter RN MSN, 11/13/2022 1:33 PM ______________________________________________________________________   Discussed status with Dr. Naaman Plummer on 11/13/22 at 1330 and received approval for admission today.  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 Yes Co-Morbidities requiring supervision/potential complications: mild headaches, post-stroke sequelae Due to bladder management, bowel management, safety, skin/wound care, disease management, medication administration, pain management, and patient education, does the patient require 24 hr/day rehab nursing?  Yes Does the patient require coordinated care of a physician, rehab nurse, PT, OT to address physical and functional deficits in the context of the above medical diagnosis(es)? Yes Addressing deficits in the following areas: balance, endurance, locomotion, strength, transferring, bowel/bladder control, bathing, dressing, feeding, grooming, toileting, and psychosocial support Can the patient actively participate in an intensive therapy program of at least 3 hrs of therapy 5 days a week? Yes The potential for patient to make measurable gains while on inpatient rehab is excellent Anticipated functional outcomes upon discharge from inpatient rehab: modified independent PT, modified independent OT, n/a SLP Estimated rehab length of stay to reach the above functional goals is: 5-7 days Anticipated discharge destination: Home 10. Overall Rehab/Functional Prognosis: excellent   MD Signature: Meredith Staggers, MD, [place] Director Rehabilitation Services 11/13/2022

## 2022-11-10 NOTE — Evaluation (Signed)
Physical Therapy Evaluation Patient Details Name: Joy Shelton MRN: [id number] DOB: August 16, 1961 Today's Date: 11/10/2022  History of Present Illness  62 y.o. female who presents 11/08/22 with complaints of right-sided weakness and difficulty talking. MRI reveals acute lacunar infarcts in the central pons left greater than right.  PMH significant of hypercholesterolemia  Clinical Impression   Pt admitted secondary to problem above with deficits below. PTA patient was living alone in one level townhouse and working as an Optometrist. Pt currently requires min assist with transfers and ambulation with a RW due to drift and lean to her right. Scored 22/56 on Berg Balance Assessment. Patient very cautious, slow when mobilizing even when given RW for extra support.  Anticipate patient will benefit from PT to address problems listed below. Will continue to follow acutely to maximize functional mobility independence and safety.          Recommendations for follow up therapy are one component of a multi-disciplinary discharge planning process, led by the attending physician.  Recommendations may be updated based on patient status, additional functional criteria and insurance authorization.  Follow Up Recommendations Acute inpatient rehab (3hours/day)      Assistance Recommended at Discharge Frequent or constant Supervision/Assistance  Patient can return home with the following  A little help with walking and/or transfers;Assistance with cooking/housework;Assist for transportation;Help with stairs or ramp for entrance    Equipment Recommendations Rolling walker (2 wheels)  Recommendations for Other Services  Rehab consult    Functional Status Assessment Patient has had a recent decline in their functional status and demonstrates the ability to make significant improvements in function in a reasonable and predictable amount of time.     Precautions / Restrictions Precautions Precautions: Fall       Mobility  Bed Mobility Overal bed mobility: Needs Assistance Bed Mobility: Supine to Sit, Sit to Supine     Supine to sit: Supervision Sit to supine: Supervision   General bed mobility comments: incr time and effort, HOB flat no rail    Transfers Overall transfer level: Needs assistance Equipment used: 1 person hand held assist, Rolling walker (2 wheels) Transfers: Sit to/from Stand Sit to Stand: Min assist           General transfer comment: without device pt reaching for UE support; required min asssit for balance; vc for proper use of RW    Ambulation/Gait Ambulation/Gait assistance: Min assist Gait Distance (Feet): 15 Feet (no device; 45 ft with RW) Assistive device: Rolling walker (2 wheels), 1 person hand held assist Gait Pattern/deviations: Step-through pattern, Decreased stride length, Shuffle, Drifts right/left   Gait velocity interpretation: <1.8 ft/sec, indicate of risk for recurrent falls   General Gait Details: very poor foot clearance with shuffling gait; very hesitant and slow; improved slightly with use of RW, however then noted drift to her right  Stairs            Wheelchair Mobility    Modified Rankin (Stroke Patients Only) Modified Rankin (Stroke Patients Only) Pre-Morbid Rankin Score: No symptoms Modified Rankin: Moderately severe disability     Balance Overall balance assessment: Needs assistance Sitting-balance support: No upper extremity supported, Feet supported Sitting balance-Leahy Scale: Good     Standing balance support: Single extremity supported Standing balance-Leahy Scale: Poor Standing balance comment: requires/seeks UE support                 Standardized Balance Assessment Standardized Balance Assessment : Berg Balance Test Berg Balance Test Sit to Stand: Needs  minimal aid to stand or to stabilize Standing Unsupported: Able to stand 2 minutes with supervision Sitting with Back Unsupported but Feet  Supported on Floor or Stool: Able to sit safely and securely 2 minutes Stand to Sit: Sits safely with minimal use of hands Transfers: Needs one person to assist Standing Unsupported with Eyes Closed: Able to stand 10 seconds with supervision Standing Ubsupported with Feet Together: Able to place feet together independently and stand for 1 minute with supervision From Standing, Reach Forward with Outstretched Arm: Reaches forward but needs supervision From Standing Position, Pick up Object from Floor: Unable to pick up and needs supervision From Standing Position, Turn to Look Behind Over each Shoulder: Needs supervision when turning Turn 360 Degrees: Needs assistance while turning Standing Unsupported, Alternately Place Feet on Step/Stool: Needs assistance to keep from falling or unable to try Standing Unsupported, One Foot in Front: Loses balance while stepping or standing Standing on One Leg: Unable to try or needs assist to prevent fall Total Score: 22         Pertinent Vitals/Pain Pain Assessment Pain Assessment: No/denies pain    Home Living Family/patient expects to be discharged to:: Private residence Living Arrangements: Alone Available Help at Discharge: Other (Comment);Available PRN/intermittently Type of Home: House Home Access: Stairs to enter   Entrance Stairs-Number of Steps: 1   Home Layout: One level Home Equipment: Conservation officer, nature (2 wheels);Cane - single point (belong to her father) Additional Comments: above information for her home; may go stay with father    Prior Function Prior Level of Function : Independent/Modified Independent               ADLs Comments: drives; works as an Sports administrator   Dominant Hand: Right    Extremity/Trunk Assessment   Upper Extremity Assessment Upper Extremity Assessment: Defer to OT evaluation    Lower Extremity Assessment Lower Extremity Assessment: RLE deficits/detail RLE Deficits / Details:  knee extension 4/5 RLE Sensation: WNL RLE Coordination: WNL    Cervical / Trunk Assessment Cervical / Trunk Assessment: Other exceptions Cervical / Trunk Exceptions: overweight  Communication   Communication: Expressive difficulties  Cognition Arousal/Alertness: Awake/alert Behavior During Therapy: WFL for tasks assessed/performed Overall Cognitive Status: Within Functional Limits for tasks assessed                                          General Comments General comments (skin integrity, edema, etc.): Mother present    Exercises     Assessment/Plan    PT Assessment Patient needs continued PT services  PT Problem List Decreased strength;Decreased balance;Decreased mobility;Decreased knowledge of use of DME;Obesity       PT Treatment Interventions DME instruction;Gait training;Stair training;Functional mobility training;Therapeutic activities;Neuromuscular re-education;Balance training;Patient/family education    PT Goals (Current goals can be found in the Care Plan section)  Acute Rehab PT Goals Patient Stated Goal: return to independent PT Goal Formulation: With patient Time For Goal Achievement: 11/24/22 Potential to Achieve Goals: Good    Frequency Min 4X/week     Co-evaluation               AM-PAC PT "6 Clicks" Mobility  Outcome Measure Help needed turning from your back to your side while in a flat bed without using bedrails?: None Help needed moving from lying on your back to sitting on the side of  a flat bed without using bedrails?: A Little Help needed moving to and from a bed to a chair (including a wheelchair)?: A Little Help needed standing up from a chair using your arms (e.g., wheelchair or bedside chair)?: A Little Help needed to walk in hospital room?: A Little Help needed climbing 3-5 steps with a railing? : A Lot 6 Click Score: 18    End of Session Equipment Utilized During Treatment: Gait belt Activity Tolerance:  Patient tolerated treatment well Patient left: in bed;with call bell/phone within reach;with bed alarm set Nurse Communication: Mobility status;Other (comment) (would be good to not use purewick and assist with walk to bathroom) PT Visit Diagnosis: Unsteadiness on feet (R26.81);Hemiplegia and hemiparesis Hemiplegia - Right/Left: Right Hemiplegia - dominant/non-dominant: Dominant Hemiplegia - caused by: Cerebral infarction    Time: 0847-0909 PT Time Calculation (min) (ACUTE ONLY): 22 min   Charges:   PT Evaluation $PT Eval Low Complexity: [place], PT Acute Rehabilitation Services  Office [phone]   Rexanne Mano 11/10/2022, 9:26 AM

## 2022-11-10 NOTE — Evaluation (Signed)
Occupational Therapy Evaluation Patient Details Name: Joy Shelton MRN: [id number] DOB: 05-31-1961 Today's Date: 11/10/2022   History of Present Illness 62 y.o. female who presents 11/08/22 with complaints of right-sided weakness and difficulty talking. MRI reveals acute lacunar infarcts in the central pons left greater than right.  PMH significant of hypercholesterolemia   Clinical Impression   PTA pt lives alone independently, works full-time at [employer] and enjoys Radiographer, therapeutic".  Pt demonstrates a decline in functional status requiring min A with mobility and ADL tasks. Feel pt could reach modified independent level with intense rehab at [facility] to facilitate safe DC home. Pt very motivated to be more independent andhas a very supportive family who can provide assist at DC if needed. Acute OT to follow.      Recommendations for follow up therapy are one component of a multi-disciplinary discharge planning process, led by the attending physician.  Recommendations may be updated based on patient status, additional functional criteria and insurance authorization.   Follow Up Recommendations  Acute inpatient rehab (3hours/day)     Assistance Recommended at Discharge Intermittent Supervision/Assistance  Patient can return home with the following A lot of help with walking and/or transfers;A little help with bathing/dressing/bathroom;Assistance with cooking/housework;Assist for transportation;Help with stairs or ramp for entrance    Functional Status Assessment  Patient has had a recent decline in their functional status and demonstrates the ability to make significant improvements in function in a reasonable and predictable amount of time.  Equipment Recommendations  BSC/3in1    Recommendations for Other Services Rehab consult     Precautions / Restrictions Precautions Precautions: Fall Restrictions Weight Bearing Restrictions: No      Mobility Bed Mobility Overal bed  mobility: Needs Assistance Bed Mobility: Supine to Sit, Sit to Supine     Supine to sit: Supervision Sit to supine: Supervision   General bed mobility comments: incr time and effort, HOB flat no rail    Transfers Overall transfer level: Needs assistance Equipment used: 1 person hand held assist, Rolling walker (2 wheels) Transfers: Sit to/from Stand Sit to Stand: Min assist           General transfer comment: without device pt reaching for UE support; required min asssit for balance; vc for proper use of RW      Balance Overall balance assessment: Needs assistance Sitting-balance support: No upper extremity supported, Feet supported Sitting balance-Leahy Scale: Good     Standing balance support: Single extremity supported Standing balance-Leahy Scale: Poor Standing balance comment: requires/seeks UE support                 Standardized Balance Assessment        ADL either performed or assessed with clinical judgement   ADL Overall ADL's : Needs assistance/impaired Eating/Feeding: Set up;Supervision/ safety;Sitting   Grooming: Set up;Supervision/safety;Sitting   Upper Body Bathing: Set up;Supervision/ safety;Sitting   Lower Body Bathing: Minimal assistance   Upper Body Dressing : Set up;Supervision/safety;Sitting   Lower Body Dressing: Minimal assistance;Sit to/from stand   Toilet Transfer: Minimal assistance;Ambulation   Toileting- Clothing Manipulation and Hygiene: Minimal assistance       Functional mobility during ADLs: Minimal assistance General ADL Comments: very rigid posture adn shuffling gait; unable to bend forward to retrienve any object at knee level; complains of dizziness with bending forward; high risk for falls     Vision Baseline Vision/History: 1 Wears glasses (reading) Patient Visual Report: No change from baseline Vision Assessment?: Yes Eye Alignment: Within Functional  Limits Ocular Range of Motion: Within Functional  Limits Alignment/Gaze Preference: Within Defined Limits Tracking/Visual Pursuits: Decreased smoothness of horizontal tracking;Decreased smoothness of vertical tracking Saccades: Decreased speed of saccadic movement;Additional eye shifts occurred during testing Convergence: Within functional limits Visual Fields: No apparent deficits     Agricultural engineer Tested?:  (WFL)   Praxis Praxis Praxis tested?: Within functional limits    Pertinent Vitals/Pain Pain Assessment Pain Assessment: No/denies pain     Hand Dominance Right   Extremity/Trunk Assessment Upper Extremity Assessment Upper Extremity Assessment: RUE deficits/detail RUE Deficits / Details: AROM and strength WFL; decreased fine motor, in-hand manipulation skills, making hand "clumsy" RUE Coordination: decreased fine motor   Lower Extremity Assessment Lower Extremity Assessment: Defer to PT evaluation RLE Deficits / Details: knee extension 4/5 RLE Sensation: WNL RLE Coordination: WNL   Cervical / Trunk Assessment Cervical / Trunk Assessment: Other exceptions Cervical / Trunk Exceptions: overweight   Communication Communication Communication: Expressive difficulties; "slurred" per pt   Cognition Arousal/Alertness: Awake/alert Behavior During Therapy: WFL for tasks assessed/performed Overall Cognitive Status: Within Functional Limits for tasks assessed                                       General Comments  Mother present    Exercises Exercises: Other exercises Other Exercises Other Exercises: work on Insurance risk surveyor /translation - coins in/out of hand while holding coins   Shoulder Instructions      Home Living Family/patient expects to be discharged to:: Private residence Living Arrangements: Alone Available Help at Discharge: Other (Comment);Available PRN/intermittently Type of Home: House Home Access: Stairs to enter [employer] of Steps: 1    Home Layout: One level     Bathroom Shower/Tub: Teacher, early years/pre: Standard Bathroom Accessibility: Yes How Accessible: Accessible via walker Home Equipment: [place] (2 wheels);Cane - single point (belong to her father)   Additional Comments: above information for her home; may go stay with father  Lives With:  (alone)    Prior Functioning/Environment Prior Level of Function : Independent/Modified Independent               ADLs Comments: drives; works as an Occupational hygienist Problem List: Decreased strength;Decreased activity tolerance;Impaired balance (sitting and/or standing);Decreased knowledge of use of DME or AE;Obesity;Impaired UE functional use      OT Treatment/Interventions: Self-care/ADL training;Therapeutic exercise;Neuromuscular education;DME and/or AE instruction;Therapeutic activities;Patient/family education;Balance training    OT Goals(Current goals can be found in the care plan section) Acute Rehab OT Goals Patient Stated Goal: to get back to normal OT Goal Formulation: With patient Time For Goal Achievement: 11/24/22 Potential to Achieve Goals: Good  OT Frequency: Min 2X/week    Co-evaluation              AM-PAC OT "6 Clicks" Daily Activity     Outcome Measure Help from another person eating meals?: A Little Help from another person taking care of personal grooming?: A Little Help from another person toileting, which includes using toliet, bedpan, or urinal?: A Little Help from another person bathing (including washing, rinsing, drying)?: A Little Help from another person to put on and taking off regular upper body clothing?: A Little Help from another person to put on and taking off regular lower body clothing?: A Little 6 Click Score: 18   End of Session Equipment Utilized  During Treatment: Gait belt Nurse Communication: Mobility status;Other (comment) (use BSC not purewick if possible)  Activity Tolerance:  Patient tolerated treatment well Patient left: in bed;with call bell/phone within reach;with family/visitor present  OT Visit Diagnosis: Unsteadiness on feet (R26.81);Other abnormalities of gait and mobility (R26.89)                Time: 9357-0177 OT Time Calculation (min): 26 min Charges:  OT General Charges $OT Visit: 1 Visit OT Evaluation $OT Eval Moderate Complexity: 1 Mod OT Treatments $Self Care/Home Management : 8-22 mins  Maurie Boettcher, OT/L   Acute OT Clinical Specialist Acute Rehabilitation Services Pager [phone] Office [phone]   [facility] 11/10/2022, 11:42 AM

## 2022-11-10 NOTE — Progress Notes (Signed)
  Transition of Care [facility]) Screening Note   Patient Details  Name: Joy Shelton Date of Birth: 12/09/1960   Transition of [facility]) CM/SW Contact:    Pollie Friar, RN Phone Number: 11/10/2022, 3:24 PM   Pt is from home alone. CIR to evaluate. Pt is hopeful that her sister or father can provide support after a rehab stay. Transition of Care Department [facility]) has reviewed patient. We will continue to monitor patient advancement through interdisciplinary progression rounds. If new patient transition needs arise, please place a TOC consult.

## 2022-11-10 NOTE — Progress Notes (Signed)
Inpatient Rehab Admissions:  Inpatient Rehab Consult received.  I met with patient at the bedside for rehabilitation assessment and to discuss goals and expectations of an inpatient rehab admission.  Discussed average length of stay, insurance authorization requirement, discharge home after completion of CIR. Pt acknowledged understanding. Pt interested in pursuing CIR. Will continue to follow.  Signed: Gayland Curry, [place], [place] Admissions Coordinator [phone]

## 2022-11-10 NOTE — Progress Notes (Addendum)
STROKE TEAM PROGRESS NOTE   INTERVAL HISTORY Her father and sister are at the bedside.  Patient is sitting up in bed, in no acute distress. Neurologic exam stable with no residual tingling or numbness but she does complain of minimal generalized weakness and imbalance with walking. VSS.   Discussed in detail the Sleep Smart clinical trial and the patient expressed interest in participating for evaluation of OSA.   Vitals:   11/10/22 0012 11/10/22 0419 11/10/22 0723 11/10/22 1216  BP: 131/88 129/75 126/71 119/69  Pulse: 62 65 75 77  Resp: 12 13 18 17   Temp: (!) 97.4 F (36.3 C)  97.8 F (36.6 C) 98.4 F (36.9 C)  TempSrc: Oral Oral Oral Oral  SpO2: 97% 97% 99% 97%  Weight:      Height:       CBC:  Recent Labs  Lab 11/08/22 0210 11/09/22 1314  WBC 9.9  --   NEUTROABS 6.5  --   HGB 15.0 13.6  HCT 45.3 40.0  MCV 85.0  --   PLT 373  --    Basic Metabolic Panel:  Recent Labs  Lab 11/08/22 0210 11/09/22 1314  NA 141 142  K 3.9 3.7  CL 106 108  CO2 28  --   GLUCOSE 127* 105*  BUN 11 9  CREATININE 0.77 0.60  CALCIUM 9.1  --    Lipid Panel:  Recent Labs  Lab 11/09/22 1255  CHOL 208*  TRIG 66  HDL 56  CHOLHDL 3.7  VLDL 13  LDLCALC 01/08/23*   HgbA1c:  Recent Labs  Lab 11/09/22 1255  HGBA1C 6.1*   Urine Drug Screen: No results for input(s): "LABOPIA", "COCAINSCRNUR", "LABBENZ", "AMPHETMU", "THCU", "LABBARB" in the last 168 hours.  Alcohol Level  Recent Labs  Lab 11/08/22 0258  ETH <10    IMAGING past 24 hours ECHOCARDIOGRAM COMPLETE  Result Date: 11/10/2022    ECHOCARDIOGRAM REPORT   Patient Name:   Joy Shelton Date of Exam: 11/10/2022 Medical Rec #:  01/09/2023        Height:       62.0 in Accession #:    [id number]       Weight:       190.0 lb Date of Birth:  [birth year]        BSA:          1.870 m Patient Age:    61 years         BP:           126/71 mmHg Patient Gender: F                HR:           59 bpm. Exam Location:  Inpatient Procedure: 2D Echo,  Color Doppler and Cardiac Doppler Indications:    Stroke  History:        Patient has no prior history of Echocardiogram examinations.                 Risk Factors:Obesity. Hx of hypercholesteremia.  Sonographer:    02/24/1961 Referring Phys: Joy Shelton  Sonographer Comments: Image acquisition challenging due to patient body habitus and Image acquisition challenging due to respiratory motion. IMPRESSIONS  1. Left ventricular ejection fraction, by estimation, is 60 to 65%. The left ventricle has normal function. The left ventricle has no regional wall motion abnormalities. Left ventricular diastolic parameters were normal.  2. Right ventricular systolic function is normal. The right ventricular size  is normal.  3. The mitral valve is normal in structure. No evidence of mitral valve regurgitation. No evidence of mitral stenosis.  4. The aortic valve is normal in structure. Aortic valve regurgitation is not visualized. No aortic stenosis is present.  5. The inferior vena cava is normal in size with greater than 50% respiratory variability, suggesting right atrial pressure of 3 mmHg. FINDINGS  Left Ventricle: Left ventricular ejection fraction, by estimation, is 60 to 65%. The left ventricle has normal function. The left ventricle has no regional wall motion abnormalities. The left ventricular internal cavity size was normal in size. There is  no left ventricular hypertrophy. Left ventricular diastolic parameters were normal. Right Ventricle: The right ventricular size is normal. No increase in right ventricular wall thickness. Right ventricular systolic function is normal. Left Atrium: Left atrial size was normal in size. Right Atrium: Right atrial size was normal in size. Pericardium: There is no evidence of pericardial effusion. Mitral Valve: The mitral valve is normal in structure. No evidence of mitral valve regurgitation. No evidence of mitral valve stenosis. MV peak gradient, 3.3 mmHg. The mean  mitral valve gradient is 1.0 mmHg. Tricuspid Valve: The tricuspid valve is normal in structure. Tricuspid valve regurgitation is not demonstrated. No evidence of tricuspid stenosis. Aortic Valve: The aortic valve is normal in structure. Aortic valve regurgitation is not visualized. No aortic stenosis is present. Pulmonic Valve: The pulmonic valve was normal in structure. Pulmonic valve regurgitation is not visualized. No evidence of pulmonic stenosis. Aorta: The aortic root is normal in size and structure. Venous: The inferior vena cava is normal in size with greater than 50% respiratory variability, suggesting right atrial pressure of 3 mmHg. IAS/Shunts: No atrial level shunt detected by color flow Doppler.  LEFT VENTRICLE PLAX 2D LVIDd:         3.90 cm     Diastology LVIDs:         2.70 cm     LV e' medial:    6.99 cm/s LV PW:         0.90 cm     LV E/e' medial:  14.0 LV IVS:        0.70 cm     LV e' lateral:   9.17 cm/s LVOT diam:     1.80 cm     LV E/e' lateral: 10.7 LV SV:         65 LV SV Index:   35 LVOT Area:     2.54 cm  LV Volumes (MOD) LV vol d, MOD A2C: 40.7 ml LV vol d, MOD A4C: 73.7 ml LV vol s, MOD A2C: 15.1 ml LV vol s, MOD A4C: 25.7 ml LV SV MOD A2C:     25.6 ml LV SV MOD A4C:     73.7 ml LV SV MOD BP:      35.6 ml RIGHT VENTRICLE             IVC RV S prime:     10.70 cm/s  IVC diam: 1.50 cm TAPSE (M-mode): 1.9 cm LEFT ATRIUM             Index        RIGHT ATRIUM           Index LA diam:        3.50 cm 1.87 cm/m   RA Area:     11.80 cm LA Vol (A2C):   27.5 ml 14.70 ml/m  RA Volume:   23.40 ml  12.51 ml/m LA Vol (A4C):   31.6 ml 16.89 ml/m LA Biplane Vol: 30.1 ml 16.09 ml/m  AORTIC VALVE LVOT Vmax:   109.00 cm/s LVOT Vmean:  80.400 cm/s LVOT VTI:    0.257 m  AORTA Ao Root diam: 2.70 cm Ao Asc diam:  2.90 cm MITRAL VALVE MV Area (PHT): 3.15 cm    SHUNTS MV Area VTI:   2.17 cm    Systemic VTI:  0.26 m MV Peak grad:  3.3 mmHg    Systemic Diam: 1.80 cm MV Mean grad:  1.0 mmHg MV Vmax:       0.90  m/s MV Vmean:      54.7 cm/s MV Decel Time: 241 msec MV E velocity: 98.00 cm/s MV A velocity: 70.80 cm/s MV E/A ratio:  1.38 Mihai Croitoru MD Electronically signed by Thurmon Fair MD Signature Date/Time: 11/10/2022/9:13:50 AM    Final     PHYSICAL EXAM  Physical Exam  Constitutional: A mildly obese middle-aged Caucasian lady sitting up in bed, in no acute distress Psych: Affect appropriate to situation, calm and cooperative with exam  Eyes: No scleral injection HENT: No OP obstrucion MSK: no joint deformities.  Cardiovascular: Normal rate and regular rhythm.  Respiratory: Effort normal, non-labored breathing  Neuro: Mental Status: Patient is awake, alert, oriented to person, place, month, year, and situation.  Patient is able to give a clear and coherent history of present illness.  No signs of aphasia or neglect.   Speech is fluent with minimal dysarthria. Cranial Nerves: II: Visual Fields are full. Pupils are equal, round, and reactive to light.   III,IV, VI: EOMI without ptosis or diploplia.  V: Facial sensation is symmetric to light touch VII: Facial movement is symmetric VIII: Hearing is intact to voice X: Palate elevates symmetrically XI: Shoulder shrug is symmetric. XII: Tongue protrudes midline Motor: Tone is normal. Bulk is normal. 5/5 strength was present in all four extremities without vertical drift.  Patient does complain of generalized weakness though there is no unilateral weakness noted on exam. Sensory: Sensation is symmetric to light touch in the arms and legs.  Cerebellar: FNF and HKS are intact bilaterally   NIHSS 0 Premorbid MRS 0  ASSESSMENT/PLAN [name] is a 62 y.o. female with history of HLD and difficulty swallowing presenting with intermittent numbness on her right side progressing to persistent right-sided numbness and transient speech disturbance with onset 11/04/2022. Patient was evaluated with an MRI brain on 12/31 that was negative  for acute infarction and she was discharged home. Due to ongoing speech disturbance and generalized weakness, patient came back to the ED for further evaluation on 11/08/22 after being discharged home earlier that day.   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  CT head 12/31: Normal head CT. CTA head & neck no LVO, subtle vessel wall irregularity in the mid cervical ICAs bilaterally suggesting fibromuscular dysplasia.  No significant stenosis is present.  Minimal calcifications at the right carotid bifurcation without significant stenosis.   MRI acute lacunar infarcts in the central pons, left greater than right.  No associated hemorrhage or mass effect. 2D Echo LVEF 60 to 65% no atrial level shunt detected. LDL 139 HgbA1c 6.1% VTE prophylaxis -Lovenox    Diet   Diet Heart Room service appropriate? Yes; Fluid consistency: Thin   No antithrombotic prior to admission, now on aspirin 81 mg daily and clopidogrel 75 mg daily for days followed  by aspirin 81 mg PO monotherapy Therapy recommendations: CIR Disposition: Pending  Hyperlipidemia Home meds:  rosuvastatin 10 mg, changed to atorvastatin 80 mg PO daily  LDL 139, goal < 70 Continue statin at discharge  Other Stroke Risk Factors Obesity, Body mass index is 34.75 kg/m., BMI >/= 30 associated with increased stroke risk, recommend weight loss, diet and exercise as appropriate   Other Active Problems   [place] day # 1  Anibal Henderson, AGACNP-BC [facility] Pager: [phone]   STROKE MD NOTE :  I have personally obtained history,examined this patient, reviewed notes, independently viewed imaging studies, participated in medical decision making and plan of care.ROS completed by me personally and pertinent positives fully documented  I have made any additions or clarifications directly to the above note. Agree with note above.  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  Recommend aspirin and Plavix for 3 weeks followed by aspirin alone and aggressive risk factor modification.  Continue ongoing stroke workup.  Patient also appears to be at risk for sleep apnea and may benefit with possible consideration for participation in the sleep smart stroke prevention study.( CPAP versus medical therapy in stroke pts with sleep apnoea detected after stroke )  I have given written information for the patient and family to review and decide if she wants to participate.  Discussed with Dr. Marcha Solders discussion with patient and multiple family members at the bedside and answered questions.  Greater than 50% time during this 50-minute visit was spent on counseling and coordination of care and discussion patient and care team and answering questions.  Antony Contras, MD Medical Director [facility] Pager: [phone] 11/10/2022 2:06 PM  To contact Stroke Continuity provider, please refer to [email or web address] After hours, contact General Neurology

## 2022-11-10 NOTE — Evaluation (Addendum)
Speech Language Pathology Evaluation Patient Details Name: Joy Shelton MRN: [id number] DOB: Mar 09, 1961 Today's Date: 11/10/2022 Time: 0915-1000 SLP Time Calculation (min) (ACUTE ONLY): 45 min  Problem List:  Patient Active Problem List   Diagnosis Date Noted   CVA (cerebral vascular accident) ([place]) 11/09/2022   Hyperlipidemia 11/09/2022   Obesity (BMI 30-39.9) 11/09/2022   Past Medical History:  Past Medical History:  Diagnosis Date   Hypercholesteremia    Past Surgical History: History reviewed. No pertinent surgical history. HPI:  Joy Shelton is a 62 y.o. female with medical history significant of hypercholesterolemia who presents with complaints of right-sided weakness and difficulty talking. Patient had just been seen in the emergency department yesterday with complaints of episodes of numbness/tingling of the bilateral hands/fingers and lips.  She was worked up and had MRI of the brain which was negative for any acute process and discharged home.  After getting home family notes that symptoms started to change and patient developed weakness on the right side.  Noted right-sided weakness of her face, arm, and leg on the right side.  Her speech was slightly slurred last night but became more pronounced overnight.    MRI showed a left more than right pons CVA.   Assessment / Plan / Recommendation Clinical Impression  Patient presents with minimal dysarthria resulting in imprecise articulation most notably of consonant clusters - pt admits to difficulty with /s/.  Phonation and resonance is adequate.  She is intelligible with occasional pauses expressing herself speech due to her dysarthria.  Repetition is fluent and adequate.  Written language normal with impaired legibility.  Right facial asymmetry noted *subtle which may be baseline but otherwise no other focal CN deficits apparent.  Lingual lateralization WFL.  [place] Mental Status exam administered with pt  scoring 26/30 - Delays in drawing clock noted as pt stated "I had to do this for a nutrition study at [facility]", ? causing anxiety.  Excellent recall skills on testing - and difficulty noted with mental manipulation *mental math and stating numbers in backward order*.  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  No acute SLP follow up needed.  Educated pt and her sister, Shirlean Mylar, to importance of emergent hospital visit needed for indication of CVA.  Pt and sister, Shirlean Mylar, report pt was brought to hospital immediately after they phoned her PCP per MD advise.  Expect ongoing spontaneous improvement of her dysarthria and reviewed she may notice worsening with frustration, fatigue, etc.  Thanks for this consult.    SLP Assessment  SLP Recommendation/Assessment: Patient does not need any further Speech [place] [facility] SLP Visit Diagnosis: Dysarthria and anarthria (R47.1)    Recommendations for follow up therapy are one component of a multi-disciplinary discharge planning process, led by the attending physician.  Recommendations may be updated based on patient status, additional functional criteria and insurance authorization.    Follow Up Recommendations  No SLP follow up    Assistance Recommended at Discharge  None  Functional Status Assessment Patient has had a recent decline in their functional status and/or demonstrates limited ability to make significant improvements in function in a reasonable and predictable amount of time  Frequency and Duration           SLP Evaluation Cognition  Overall Cognitive Status: Within Functional Limits for tasks assessed Arousal/Alertness: Awake/alert Orientation Level: Oriented X4 Year: 2024 Month: January Day of Week: Correct Attention: Sustained;Selective Sustained Attention: Appears intact Selective  Attention: Appears intact Memory: Appears intact Awareness: Appears intact Problem Solving: Appears intact Comments:  stated able to use cell phone (in lieu of office phone) when dad brings charger for her       Comprehension  Auditory Comprehension Yes/No Questions: Within Functional Limits Commands: Within Functional Limits Conversation: Complex Visual Recognition/Discrimination Discrimination: Not tested Reading Comprehension Reading Status: Within funtional limits    Expression Expression Primary Mode of Expression: Verbal Verbal Expression Overall Verbal Expression: Appears within functional limits for tasks assessed Initiation: Impaired (minimal delay inconsistently present during expression due to dysarthria, no motor planning deficits) Level of Generative/Spontaneous Verbalization: Conversation Repetition: No impairment Naming: No impairment Pragmatics: No impairment Non-Verbal Means of Communication: Not applicable Written Expression Dominant Hand: Right Written Expression:  (language intact but legibiltiy is imipaired, improving compared to earlier in ED)   Oral / Motor  Oral Motor/Sensory Function Overall Oral Motor/Sensory Function: Mild impairment Facial ROM: Within Functional Limits Facial Symmetry: Abnormal symmetry right Facial Strength: Within Functional Limits Facial Sensation: Within Functional Limits Lingual Symmetry: Within Functional Limits Lingual Strength: Within Functional Limits Lingual Sensation: Within Functional Limits Velum: Within Functional Limits Mandible: Within Functional Limits Motor Speech Overall Motor Speech: Appears within functional limits for tasks assessed Respiration: Within functional limits Phonation: Normal Resonance: Within functional limits Articulation: Within functional limitis Intelligibility: Intelligible Sentence: 75-100% accurate Conversation: 75-100% accurate Motor Planning: Witnin functional limits Motor Speech Errors: Not applicable Effective Techniques: Slow rate (pausing to help with articulation)            Macario Golds 11/10/2022, 11:59 AM Kathleen Lime, MS [facility] SLP Acute Rehab Services Office [phone] Pager [phone]

## 2022-11-10 NOTE — Progress Notes (Signed)
  Echocardiogram 2D Echocardiogram has been performed.  Joy Shelton 11/10/2022, 8:42 AM

## 2022-11-11 DIAGNOSIS — I6389 Other cerebral infarction: Secondary | ICD-10-CM | POA: Diagnosis not present

## 2022-11-11 DIAGNOSIS — R531 Weakness: Secondary | ICD-10-CM

## 2022-11-11 LAB — BASIC METABOLIC PANEL
Anion gap: 10 (ref 5–15)
BUN: 15 mg/dL (ref 6–20)
CO2: 23 mmol/L (ref 22–32)
Calcium: 8.8 mg/dL — ABNORMAL LOW (ref 8.9–10.3)
Chloride: 106 mmol/L (ref 98–111)
Creatinine, Ser: 0.79 mg/dL (ref 0.44–1.00)
GFR, Estimated: >60 mL/min (ref >60)
Glucose, Bld: 93 mg/dL (ref 70–99)
Potassium: 3.6 mmol/L (ref 3.5–5.1)
Sodium: 139 mmol/L (ref 135–145)

## 2022-11-11 MED ORDER — LOPERAMIDE HCL 2 MG PO CAPS
2.0000 mg | ORAL_CAPSULE | Freq: Four times a day (QID) | ORAL | Status: DC | PRN
  Administered 2022-11-11: 2 mg via ORAL
  Filled 2022-11-11: qty 1

## 2022-11-11 NOTE — Progress Notes (Signed)
PROGRESS NOTE  Joy Shelton  EAV:[id number] DOB: 29-Nov-1960 DOA: 11/08/2022 PCP: Laurann Montana, MD   Brief Narrative: Patient is a 62 year old female with history of hyperlipidemia who presented here with right-sided weakness, slurred speech.  She was earlier seen in the ED with complaints of numbness/tingling on bilateral hands but at that time MRI was negative and she was discharged home.  Came back to the ED with weakness on the right side.  Speech was slurred.    MRI showed acute lacunar infarct in the central pons left greater than right.  PT/OT recommending acute inpatient rehab.  Stroke workup completed, medically stable for discharge to inpatient rehab whenever possible.  Assessment & Plan:  Principal Problem:   CVA (cerebral vascular accident) ([facility]) Active Problems:   Hyperlipidemia   Obesity (BMI 30-39.9)  Acute ischemic stroke: Initially presented with numbness on bilateral upper extremities but at the time MRI was negative so she was discharged home.  Came back with right-sided weakness, slurred speech.   MRI showed acute lacunar infarct in the central pons left greater than right.  PT/OT recommending acute inpatient rehab.  Neurology following. Started on aspirin, Plavix.  Plan is to continue for 3 weeks followed by aspirin monotherapy.  Continue statin. CT angiogram did not show any large vessel occlusion.  Echo showed EF of 60 to 60%, normal diastolic parameters, no intracardiac source of emboli.  A1c of 6.1.  LDL of 139. PT/OT recommending inpatient rehab.  Hyperlipidemia: LDL of 139.  Continue Lipitor 80 mg daily.  Obesity: BMI 34.7.       DVT prophylaxis:enoxaparin (LOVENOX) injection 40 mg Start: 11/10/22 1400Lovenox     Code Status: Full Code  Family Communication: Family member at bedside  Patient status: Inpatient  Patient is from : Home  Anticipated discharge to: Acute inpatient rehab  Estimated DC date: Whenever possible  Consultants:  Neurology  Procedures: None  Antimicrobials:  Anti-infectives (From admission, onward)    None       Subjective: Patient seen and examined at bedside today.  Hemodynamically stable.  She remains alert and oriented.  Speech is clear.  Weakness on the right side has significantly improved.  Objective: Vitals:   11/10/22 2013 11/10/22 2311 11/11/22 0354 11/11/22 0836  BP: (!) 131/55 (!) 120/57 (!) 116/58 118/61  Pulse: 82 77 70 72  Resp: 16 16 15 16   Temp: 98.7 F (37.1 C) 98.1 F (36.7 C) 98 F (36.7 C) 98.3 F (36.8 C)  TempSrc: Oral Oral Oral Oral  SpO2: 97% 96% 96% 97%  Weight:      Height:        Intake/Output Summary (Last 24 hours) at 11/11/2022 1049 Last data filed at 11/11/2022 1000 Gross per 24 hour  Intake 480 ml  Output 1 ml  Net 479 ml   Filed Weights   11/09/22 1544  Weight: 86.2 kg    Examination:  General exam: Overall comfortable, not in distress,obese HEENT: PERRL Respiratory system:  no wheezes or crackles  Cardiovascular system: S1 & S2 heard, RRR.  Gastrointestinal system: Abdomen is nondistended, soft and nontender. Central nervous system: Alert and oriented, very mild right-sided weakness Extremities: No edema, no clubbing ,no cyanosis Skin: No rashes, no ulcers,no icterus     Data Reviewed: I have personally reviewed following labs and imaging studies  CBC: Recent Labs  Lab 11/08/22 0210 11/09/22 1314  WBC 9.9  --   NEUTROABS 6.5  --   HGB 15.0 13.6  HCT 45.3 40.0  MCV 85.0  --   PLT 373  --    Basic Metabolic Panel: Recent Labs  Lab 11/08/22 0210 11/09/22 1314 11/11/22 0328  NA 141 142 139  K 3.9 3.7 3.6  CL 106 108 106  CO2 28  --  23  GLUCOSE 127* 105* 93  BUN 11 9 15   CREATININE 0.77 0.60 0.79  CALCIUM 9.1  --  8.8*     No results found for this or any previous visit (from the past 240 hour(s)).   Radiology Studies: ECHOCARDIOGRAM COMPLETE  Result Date: 11/10/2022    ECHOCARDIOGRAM REPORT   [name] Date of Exam: 11/10/2022 Medical Rec #:  01/09/[id number]        Height:       62.0 in Accession #:    [id number]       Weight:       190.0 lb Date of Birth:  [birth year]        BSA:          1.870 m Patient Age:    61 years         BP:           126/71 mmHg Patient Gender: F                HR:           59 bpm. Exam Location:  Inpatient Procedure: 2D Echo, Color Doppler and Cardiac Doppler Indications:    Stroke  History:        Patient has no prior history of Echocardiogram examinations.                 Risk Factors:Obesity. Hx of hypercholesteremia.  Sonographer:    02/24/1961 Referring Phys: Milda Smart RONDELL A SMITH  Sonographer Comments: Image acquisition challenging due to patient body habitus and Image acquisition challenging due to respiratory motion. IMPRESSIONS  1. Left ventricular ejection fraction, by estimation, is 60 to 65%. The left ventricle has normal function. The left ventricle has no regional wall motion abnormalities. Left ventricular diastolic parameters were normal.  2. Right ventricular systolic function is normal. The right ventricular size is normal.  3. The mitral valve is normal in structure. No evidence of mitral valve regurgitation. No evidence of mitral stenosis.  4. The aortic valve is normal in structure. Aortic valve regurgitation is not visualized. No aortic stenosis is present.  5. The inferior vena cava is normal in size with greater than 50% respiratory variability, suggesting right atrial pressure of 3 mmHg. FINDINGS  Left Ventricle: Left ventricular ejection fraction, by estimation, is 60 to 65%. The left ventricle has normal function. The left ventricle has no regional wall motion abnormalities. The left ventricular internal cavity size was normal in size. There is  no left ventricular hypertrophy. Left ventricular diastolic parameters were normal. Right Ventricle: The right ventricular size is normal. No increase in right ventricular wall thickness. Right  ventricular systolic function is normal. Left Atrium: Left atrial size was normal in size. Right Atrium: Right atrial size was normal in size. Pericardium: There is no evidence of pericardial effusion. Mitral Valve: The mitral valve is normal in structure. No evidence of mitral valve regurgitation. No evidence of mitral valve stenosis. MV peak gradient, 3.3 mmHg. The mean mitral valve gradient is 1.0 mmHg. Tricuspid Valve: The tricuspid valve is normal in structure. Tricuspid valve regurgitation is not demonstrated. No evidence of tricuspid stenosis. Aortic Valve: The aortic valve is  normal in structure. Aortic valve regurgitation is not visualized. No aortic stenosis is present. Pulmonic Valve: The pulmonic valve was normal in structure. Pulmonic valve regurgitation is not visualized. No evidence of pulmonic stenosis. Aorta: The aortic root is normal in size and structure. Venous: The inferior vena cava is normal in size with greater than 50% respiratory variability, suggesting right atrial pressure of 3 mmHg. IAS/Shunts: No atrial level shunt detected by color flow Doppler.  LEFT VENTRICLE PLAX 2D LVIDd:         3.90 cm     Diastology LVIDs:         2.70 cm     LV e' medial:    6.99 cm/s LV PW:         0.90 cm     LV E/e' medial:  14.0 LV IVS:        0.70 cm     LV e' lateral:   9.17 cm/s LVOT diam:     1.80 cm     LV E/e' lateral: 10.7 LV SV:         65 LV SV Index:   35 LVOT Area:     2.54 cm  LV Volumes (MOD) LV vol d, MOD A2C: 40.7 ml LV vol d, MOD A4C: 73.7 ml LV vol s, MOD A2C: 15.1 ml LV vol s, MOD A4C: 25.7 ml LV SV MOD A2C:     25.6 ml LV SV MOD A4C:     73.7 ml LV SV MOD BP:      35.6 ml RIGHT VENTRICLE             IVC RV S prime:     10.70 cm/s  IVC diam: 1.50 cm TAPSE (M-mode): 1.9 cm LEFT ATRIUM             Index        RIGHT ATRIUM           Index LA diam:        3.50 cm 1.87 cm/m   RA Area:     11.80 cm LA Vol (A2C):   27.5 ml 14.70 ml/m  RA Volume:   23.40 ml  12.51 ml/m LA Vol (A4C):   31.6  ml 16.89 ml/m LA Biplane Vol: 30.1 ml 16.09 ml/m  AORTIC VALVE LVOT Vmax:   109.00 cm/s LVOT Vmean:  80.400 cm/s LVOT VTI:    0.257 m  AORTA Ao Root diam: 2.70 cm Ao Asc diam:  2.90 cm MITRAL VALVE MV Area (PHT): 3.15 cm    SHUNTS MV Area VTI:   2.17 cm    Systemic VTI:  0.26 m MV Peak grad:  3.3 mmHg    Systemic Diam: 1.80 cm MV Mean grad:  1.0 mmHg MV Vmax:       0.90 m/s MV Vmean:      54.7 cm/s MV Decel Time: 241 msec MV E velocity: 98.00 cm/s MV A velocity: 70.80 cm/s MV E/A ratio:  1.38 Mihai Croitoru MD Electronically signed by Thurmon Fair MD Signature Date/Time: 11/10/2022/9:13:50 AM    Final    CT ANGIO HEAD NECK W WO CM  Result Date: 11/09/2022 CLINICAL DATA:  Right-sided weakness and difficulty speaking. Acute lacunar infarcts of the central pons, left greater than right. EXAM: CT ANGIOGRAPHY HEAD AND NECK TECHNIQUE: Multidetector CT imaging of the head and neck was performed using the standard protocol during bolus administration of intravenous contrast. Multiplanar CT image reconstructions and MIPs were obtained to evaluate the  vascular anatomy. Carotid stenosis measurements (when applicable) are obtained utilizing NASCET criteria, using the distal internal carotid diameter as the denominator. RADIATION DOSE REDUCTION: This exam was performed according to the departmental dose-optimization program which includes automated exposure control, adjustment of the mA and/or kV according to patient size and/or use of iterative reconstruction technique. CONTRAST:  71mL OMNIPAQUE IOHEXOL 350 MG/ML SOLN COMPARISON:  MR head without and with contrast 11/09/2022 FINDINGS: CTA NECK FINDINGS Aortic arch: A 3 vessel arch configuration is present. Minimal calcifications are present in the proximal left subclavian artery. No significant stenosis or aneurysm is present. Right carotid system: The right common carotid artery is within normal limits. Minimal calcifications are present at the right carotid  bifurcation. Subtle vessel wall irregularity is present in the mid cervical right ICA. No significant stenosis is present. Left carotid system: Left common carotid artery is within normal limits. Bifurcation is unremarkable. Subtle vessel wall irregularity is present in mid cervical left ICA without significant stenosis. Vertebral arteries: The right vertebral artery is dominant. Both vertebral arteries originate from the subclavian arteries without significant stenosis. No significant stenosis is present in either vertebral artery in the neck. Skeleton: Vertebral body heights and alignment are normal. No focal osseous lesions are present. Other neck: Soft tissues the neck are otherwise unremarkable. Salivary glands are within normal limits. Thyroid is normal. No significant adenopathy is present. No focal mucosal or submucosal lesions are present. Upper chest: Patchy ground-glass attenuation is present at both lung apices. No nodule or mass lesion is present. No significant pleural disease is present. Review of the MIP images confirms the above findings CTA HEAD FINDINGS Anterior circulation: The internal carotid arteries are within normal limits from the high cervical segments through the ICA termini. The A1 and M1 segments are normal. The anterior communicating artery is patent. The MCA bifurcations are normal bilaterally. The ACA and MCA branch vessels are within normal limits bilaterally. No aneurysm is present. Posterior circulation: The right PICA origin is at the dural margin. Fenestration is present at the vertebrobasilar junction. No aneurysm is present. The basilar artery is normal. Both posterior cerebral arteries originate from the basilar tip. The PCA branch vessels are within normal limits bilaterally. Venous sinuses: The dural sinuses are patent. The straight sinus and deep cerebral veins are intact. Cortical veins are within normal limits. No significant vascular malformation is evident. Anatomic  variants: None Review of the MIP images confirms the above findings IMPRESSION: 1. No emergent large vessel occlusions. Normal variant CTA Circle of Willis without proximal stenosis, aneurysm or branch vessel occlusion. 2. Subtle vessel wall irregularity in the mid cervical internal carotid arteries bilaterally suggesting fibromuscular dysplasia. No significant stenosis is present. 3. Minimal calcifications at the right carotid bifurcation without significant stenosis. 4. Patchy ground-glass attenuation at both lung apices. This could represent atelectasis or edema. Infection is not excluded. The above was relayed via text pager to Dr. Lesleigh Noe on 11/09/2022 at 13:59. Electronically Signed   By: San Morelle M.D.   On: 11/09/2022 13:59   MR Brain W and Wo Contrast  Addendum Date: 11/09/2022   ADDENDUM REPORT: 11/09/2022 13:02 ADDENDUM: Study discussed by telephone with Dr. Tamala Julian on 11/09/2022 at 1259 hours. Electronically Signed   By: Genevie Ann M.D.   On: 11/09/2022 13:02   Result Date: 11/09/2022 CLINICAL DATA:  62 year old female with neurologic deficit initially presenting with headache and numbness yesterday. New onset right side weakness and difficulty speaking since evaluation yesterday. Unrevealing MRI yesterday prior  to the new symptoms. EXAM: MRI HEAD WITHOUT AND WITH CONTRAST TECHNIQUE: Multiplanar, multiecho pulse sequences of the brain and surrounding structures were obtained without and with intravenous contrast. CONTRAST:  56mL GADAVIST GADOBUTROL 1 MMOL/ML IV SOLN COMPARISON:  Brain MRI 11/08/2022 and earlier. FINDINGS: Brain: Patchy and linear restricted diffusion now in the left paracentral pons in an area of about 12 mm. But also subtle contralateral right central pontine restricted diffusion. See series 3, image 17. This is basilar artery perforator branch territory, and these were not apparent yesterday. Associated new T2 heterogeneity within the pons. No associated hemorrhage,  significant pontine expansion, or mass effect. Only subtle FLAIR hyperintensity in the left central pons at this time (series 6, image 11). No abnormal enhancement identified. No other restricted diffusion. No midline shift, mass effect, evidence of mass lesion, ventriculomegaly, extra-axial collection or acute intracranial hemorrhage. Cervicomedullary junction and pituitary are within normal limits. Background cerebral volume remains normal. Outside of the brainstem gray and white matter signal is stable and normal for age. No cortical encephalomalacia or chronic cerebral blood products identified. No dural thickening. Vascular: Major intracranial vascular flow voids are stable. Following contrast the major dural venous sinuses are enhancing and appear to be patent. And furthermore, Skull and upper cervical spine: Visible cervical spine remains normal. Visualized bone marrow signal is within normal limits. Sinuses/Orbits: Stable and negative. Other: Visible internal auditory structures appear normal. Negative visible scalp and face. IMPRESSION: 1. 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 No associated hemorrhage or mass effect. 2. Elsewhere stable and normal for age MRI appearance of the brain. Electronically Signed: By: Genevie Ann M.D. On: 11/09/2022 12:52    Scheduled Meds:  aspirin EC  81 mg Oral Daily   atorvastatin  80 mg Oral Daily   clopidogrel  75 mg Oral Daily   enoxaparin (LOVENOX) injection  40 mg Subcutaneous Q24H   Continuous Infusions:   LOS: 2 days   Shelly Coss, MD [facility] P1/01/2023, 10:49 AM

## 2022-11-11 NOTE — Progress Notes (Signed)
Physical Therapy Treatment Patient Details Name: Joy Shelton MRN: [id number] DOB: Sep 27, 1961 Today's Date: 11/11/2022   History of Present Illness 62 y.o. female who presents 11/08/22 with complaints of right-sided weakness and difficulty talking. MRI reveals acute lacunar infarcts in the central pons left greater than right.  PMH significant of hypercholesterolemia    PT Comments     Pt is demonstrating continued balance impairments with difficulty maitnaining rhomberg standing, semi tandem and SLS for greater than 3 seconds. Pt gait is shuffling and requires some assistance with AD navigation due to weakness and incoordination. Due to pt previously level of function, current level of function, available assistance at home pt will benefit from skilled physical therapy services in AIR setting on discharge from acute care hospital setting in order to decrease risk for falls, injury, immobility and return to PLOF. Pt demonstrates no signs/symptoms of cardiac/respiratory distress throughout session.   Recommendations for follow up therapy are one component of a multi-disciplinary discharge planning process, led by the attending physician.  Recommendations may be updated based on patient status, additional functional criteria and insurance authorization.  Follow Up Recommendations  Acute inpatient rehab (3hours/day)     Assistance Recommended at Discharge Frequent or constant Supervision/Assistance  Patient can return home with the following A little help with walking and/or transfers;Assistance with cooking/housework;Assist for transportation;Help with stairs or ramp for entrance   Equipment Recommendations  Rolling walker (2 wheels)    Recommendations for Other Services       Precautions / Restrictions Precautions Precautions: Fall Restrictions Weight Bearing Restrictions: No     Mobility  Bed Mobility Overal bed mobility: Needs Assistance Bed Mobility: Supine to Sit, Sit to  Supine     Supine to sit: Supervision Sit to supine: Supervision   General bed mobility comments: incr time and effort, HOB flat no rail Patient Response: Cooperative  Transfers Overall transfer level: Needs assistance Equipment used: Rolling walker (2 wheels) Transfers: Sit to/from Stand Sit to Stand: Min guard           General transfer comment: requires verbal cues for safe hand placement to prevent pt from pulling up from RW.    Ambulation/Gait Ambulation/Gait assistance: Min assist Gait Distance (Feet): 100 Feet Assistive device: Rolling walker (2 wheels) Gait Pattern/deviations: Step-through pattern, Decreased stride length, Shuffle, Drifts right/left   Gait velocity interpretation: <1.8 ft/sec, indicate of risk for recurrent falls   General Gait Details: very poor foot clearance with shuffling gait; very hesitant and slow. Verbal cues for picking up feet.     Modified Rankin (Stroke Patients Only) Modified Rankin (Stroke Patients Only) Pre-Morbid Rankin Score: No symptoms Modified Rankin: Moderate disability     Balance Overall balance assessment: Needs assistance Sitting-balance support: No upper extremity supported, Feet supported Sitting balance-Leahy Scale: Good     Standing balance support: Bilateral upper extremity supported, No upper extremity supported, Reliant on assistive device for balance Standing balance-Leahy Scale: Poor               High level balance activites: Other (comment) High Level Balance Comments: performed rhomberg standing, semi tandem standing and SLS. Pt was unable to hold any position for greater than 3 seconds without Min A intervention from physical therapist to maintain upright balance. pt performed wgt shifting R/L with arms crossed with shifting L better than R with less LOB but minimal rotation due to poor shifting balance.            Cognition Arousal/Alertness: Awake/alert Behavior During  Therapy: WFL for  tasks assessed/performed Overall Cognitive Status: Within Functional Limits for tasks assessed                   Pertinent Vitals/Pain Pain Assessment Pain Assessment: No/denies pain     PT Goals (current goals can now be found in the care plan section) Acute Rehab PT Goals Patient Stated Goal: return to independent PT Goal Formulation: With patient Time For Goal Achievement: 11/24/22 Potential to Achieve Goals: Good Progress towards PT goals: Progressing toward goals    Frequency    Min 4X/week      PT Plan Current plan remains appropriate       AM-PAC PT "6 Clicks" Mobility   Outcome Measure  Help needed turning from your back to your side while in a flat bed without using bedrails?: None Help needed moving from lying on your back to sitting on the side of a flat bed without using bedrails?: A Little Help needed moving to and from a bed to a chair (including a wheelchair)?: A Little Help needed standing up from a chair using your arms (e.g., wheelchair or bedside chair)?: A Little Help needed to walk in hospital room?: A Little Help needed climbing 3-5 steps with a railing? : A Lot 6 Click Score: 18    End of Session Equipment Utilized During Treatment: Gait belt Activity Tolerance: Patient tolerated treatment well Patient left: in bed;with call bell/phone within reach;with bed alarm set;with family/visitor present Nurse Communication: Mobility status PT Visit Diagnosis: Unsteadiness on feet (R26.81);Hemiplegia and hemiparesis Hemiplegia - Right/Left: Right Hemiplegia - dominant/non-dominant: Dominant Hemiplegia - caused by: Cerebral infarction     Time: 1241-1257 PT Time Calculation (min) (ACUTE ONLY): 16 min  Charges:  $Gait Training: 8-22 mins                     Tomma Rakers, DPT, [place] Office: [phone] (Secure chat preferred)    Ander Purpura 11/11/2022, 1:45 PM

## 2022-11-11 NOTE — Progress Notes (Signed)
STROKE TEAM PROGRESS NOTE   INTERVAL HISTORY Her  sister is at the bedside.  Patient is sitting up in bed, in no acute distress. Neurologic exam stable with no residual tingling or numbness and she was able to ambulate with improved balance.  She did participate in the Sleep Smart clinical trial and had overnight NOx 3 monitor for sleep apnea and tested positive and will have CPAP mask tolerability test tonight.  Vital signs stable.  Neurological exam unchanged Vitals:   11/10/22 2013 11/10/22 2311 11/11/22 0354 11/11/22 0836  BP: (!) 131/55 (!) 120/57 (!) 116/58 118/61  Pulse: 82 77 70 72  Resp: 16 16 15 16   Temp: 98.7 F (37.1 C) 98.1 F (36.7 C) 98 F (36.7 C) 98.3 F (36.8 C)  TempSrc: Oral Oral Oral Oral  SpO2: 97% 96% 96% 97%  Weight:      Height:       CBC:  Recent Labs  Lab 11/08/22 0210 11/09/22 1314  WBC 9.9  --   NEUTROABS 6.5  --   HGB 15.0 13.6  HCT 45.3 40.0  MCV 85.0  --   PLT 373  --    Basic Metabolic Panel:  Recent Labs  Lab 11/08/22 0210 11/09/22 1314 11/11/22 0328  NA 141 142 139  K 3.9 3.7 3.6  CL 106 108 106  CO2 28  --  23  GLUCOSE 127* 105* 93  BUN 11 9 15   CREATININE 0.77 0.60 0.79  CALCIUM 9.1  --  8.8*   Lipid Panel:  Recent Labs  Lab 11/09/22 1255  CHOL 208*  TRIG 66  HDL 56  CHOLHDL 3.7  VLDL 13  LDLCALC 139*   HgbA1c:  Recent Labs  Lab 11/09/22 1255  HGBA1C 6.1*   Urine Drug Screen: No results for input(s): "LABOPIA", "COCAINSCRNUR", "LABBENZ", "AMPHETMU", "THCU", "LABBARB" in the last 168 hours.  Alcohol Level  Recent Labs  Lab 11/08/22 0258  ETH <10    IMAGING past 24 hours No results found.  PHYSICAL EXAM  Physical Exam  Constitutional: A mildly obese middle-aged Caucasian lady sitting up in bed, in no acute distress Psych: Affect appropriate to situation, calm and cooperative with exam  Eyes: No scleral injection HENT: No OP obstrucion MSK: no joint deformities.  Cardiovascular: Normal rate and regular  rhythm.  Respiratory: Effort normal, non-labored breathing  Neuro: Mental Status: Patient is awake, alert, oriented to person, place, month, year, and situation.  Patient is able to give a clear and coherent history of present illness.  No signs of aphasia or neglect.   Speech is fluent with minimal dysarthria. Cranial Nerves: II: Visual Fields are full. Pupils are equal, round, and reactive to light.   III,IV, VI: EOMI without ptosis or diploplia.  V: Facial sensation is symmetric to light touch VII: Facial movement is symmetric VIII: Hearing is intact to voice X: Palate elevates symmetrically XI: Shoulder shrug is symmetric. XII: Tongue protrudes midline Motor: Tone is normal. Bulk is normal. 5/5 strength was present in all four extremities without vertical drift.  Patient does complain of generalized weakness though there is no unilateral weakness noted on exam. Sensory: Sensation is symmetric to light touch in the arms and legs.  Cerebellar: FNF and HKS are intact bilaterally   NIHSS 0 Premorbid MRS 0  ASSESSMENT/PLAN [name] is a 62 y.o. female with history of HLD and difficulty swallowing presenting with intermittent numbness on her right side progressing to persistent right-sided numbness and transient  speech disturbance with onset 11/04/2022. Patient was evaluated with an MRI brain on 12/31 that was negative for acute infarction and she was discharged home. Due to ongoing speech disturbance and generalized weakness, patient came back to the ED for further evaluation on 11/08/22 after being discharged home earlier that day.   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  CT head 12/31: Normal head CT. CTA head & neck no LVO, subtle vessel wall irregularity in the mid cervical ICAs bilaterally suggesting fibromuscular dysplasia.  No significant stenosis is present.  Minimal calcifications at the right  carotid bifurcation without significant stenosis.   MRI acute lacunar infarcts in the central pons, left greater than right.  No associated hemorrhage or mass effect. 2D Echo LVEF 60 to 65% no atrial level shunt detected. LDL 139 HgbA1c 6.1% VTE prophylaxis -Lovenox    Diet   Diet Heart Room service appropriate? Yes; Fluid consistency: Thin   No antithrombotic prior to admission, now on aspirin 81 mg daily and clopidogrel 75 mg daily for days followed by aspirin 81 mg PO monotherapy Therapy recommendations: CIR Disposition: Pending  Hyperlipidemia Home meds:  rosuvastatin 10 mg, changed to atorvastatin 80 mg PO daily  LDL 139, goal < 70 Continue statin at discharge  Other Stroke Risk Factors Obesity, Body mass index is 34.75 kg/m., BMI >/= 30 associated with increased stroke risk, recommend weight loss, diet and exercise as appropriate   Other Active Problems   Hospital day # 2  Patient presented with 3-day history of fluctuating paresthesia as followed by slurred speech and MRI scan shows bilateral pontine lacunar 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  Recommend aspirin and Plavix for 3 weeks followed by aspirin alone and aggressive risk factor modification.  Patient is participating in the sleep smart stroke prevention study.( CPAP versus medical therapy in stroke pts with sleep apnoea detected after stroke ) and tested positive with an overnight NOx 3 monitor for sleep apnea.  She will have a CPAP mask tolerability test tonight.  Medically stable to be transferred to inpatient rehab when bed available..  Discussed with Dr. Marcha Solders discussion with patient and multiple family members at the bedside and answered questions.  Greater than 50% time during this 35-minute visit was spent on counseling and coordination of care and discussion patient and care team and answering questions.  Antony Contras, MD Medical Director [facility] Pager: [phone] 11/11/2022  11:51 AM  To contact Stroke Continuity provider, please refer to [email or web address] After hours, contact General Neurology

## 2022-11-11 NOTE — Progress Notes (Signed)
Inpatient Rehabilitation Admissions Coordinator   I met at bedside with patient and her sister, Shirlean Mylar. I verified caregiver supports after a possible CIR admit. I will begin insurance Auth with [employer] . We reviewed goals and expectations of admit. They are in agreement.  Danne Baxter, RN, MSN Rehab Admissions Coordinator [phone] 11/11/2022 11:57 AM

## 2022-11-12 DIAGNOSIS — E7800 Pure hypercholesterolemia, unspecified: Secondary | ICD-10-CM | POA: Diagnosis not present

## 2022-11-12 DIAGNOSIS — I639 Cerebral infarction, unspecified: Secondary | ICD-10-CM | POA: Diagnosis not present

## 2022-11-12 DIAGNOSIS — E669 Obesity, unspecified: Secondary | ICD-10-CM | POA: Diagnosis not present

## 2022-11-12 DIAGNOSIS — I6300 Cerebral infarction due to thrombosis of unspecified precerebral artery: Secondary | ICD-10-CM

## 2022-11-12 DIAGNOSIS — R531 Weakness: Secondary | ICD-10-CM | POA: Diagnosis not present

## 2022-11-12 MED ORDER — ROSUVASTATIN CALCIUM 20 MG PO TABS
40.0000 mg | ORAL_TABLET | Freq: Every day | ORAL | Status: DC
  Administered 2022-11-12 – 2022-11-13 (×2): 40 mg via ORAL
  Filled 2022-11-12 (×3): qty 2

## 2022-11-12 NOTE — Progress Notes (Signed)
[facility]                                                                              Joy Shelton, is a 62 y.o. female, DOB - 20-Oct-1961, DXA:[id number] Admit date - 11/08/2022    Outpatient Primary MD for the patient is Harlan Stains, MD  LOS - 3  days  Chief Complaint  Patient presents with   Numbness        Brief summary   Patient is a 62 year old female with history of hyperlipidemia who presented here with right-sided weakness, slurred speech.  She was earlier seen in the ED with complaints of numbness/tingling on bilateral hands but at that time MRI was negative and she was discharged home.  Came back to the ED with weakness on the right side.  Speech was slurred.    MRI showed acute lacunar infarct in the central pons left greater than right.  PT/OT recommending acute inpatient rehab.    Stroke workup completed, medically stable for discharge to CIR when bed available    Assessment & Plan    Principal Problem: Acute ischemic CVA (cerebral vascular accident) ([place]) -Initially presented with numbness of bilateral upper extremities to the ED on 11/08/22 however MRI was negative for acute intracranial abnormality -Patient returned back with persisting symptoms, repeat MRI on 11/09/2022 showed acute lacunar infarct in the central pons left> right, no associated hemorrhage or mass effect -CTA head and neck showed no emergent large vessel occlusions -2D echo showed EF of 60 to 20%, normal diastolic parameters, no intracardiac source of emboli -Hemoglobin A1c 6.1 LDL 139, placed on statin PT recommended inpatient rehab -Neurology recommended aspirin 81 mg daily, Plavix 75 mg daily for 3 weeks then continue aspirin 81 mg p.o. monotherapy  Active Problems:   Hyperlipidemia -LDL 139, patient was placed on Lipitor however she reported prior intolerance to Lipitor and has tolerated Crestor.  Requesting change to Crestor -Changed to Crestor 40 mg daily (was on 10 mg  daily at home)     Obesity (BMI 30-39.9) Estimated body mass index is 34.75 kg/m as calculated from the following:   Height as of this encounter: 5\' 2"  (1.575 m).   Weight as of this encounter: 86.2 kg.  Code Status: Full code DVT Prophylaxis:  enoxaparin (LOVENOX) injection 40 mg Start: 11/10/22 1400   Level of Care: Level of care: Telemetry Medical Family Communication: Updated patient   Disposition Plan:      Remains inpatient appropriate: Awaiting CIR   Procedures:  MRI brain, CTA head and neck, 2D echo  Consultants:   Neurology, CIR  Antimicrobials: None   Medications  aspirin EC  81 mg Oral Daily   clopidogrel  75 mg Oral Daily   enoxaparin (LOVENOX) injection  40 mg Subcutaneous Q24H   rosuvastatin  40 mg Oral Daily      Subjective:   Joy Shelton was seen and examined today.  Weakness on the right improving, speech clear.  Complaining of global weakness.  No chest pain, shortness of breath, dizziness or headaches.  Objective:   Vitals:   11/12/22 0004 11/12/22 0318 11/12/22 0840 11/12/22 1130  BP: 110/66 (!) 137/114 (!) 126/57 (!) 119/50  Pulse: 71 70 81 67  Resp: 14 14 18 16   Temp:  98.1 F (36.7 C) 98 F (36.7 C) 98.5 F (36.9 C)  TempSrc:  Oral Oral Oral  SpO2: 97% 96% 98% 96%  Weight:      Height:       No intake or output data in the 24 hours ending 11/12/22 1338   Wt Readings from Last 3 Encounters:  11/09/22 86.2 kg  11/08/22 86.2 kg  04/05/21 85.6 kg     Exam General: Alert and oriented x 3, NAD Cardiovascular: S1 S2 auscultated,  RRR Respiratory: Clear to auscultation bilaterally, no wheezing Gastrointestinal: Soft, nontender, nondistended, + bowel sounds Ext: no pedal edema bilaterally Neuro: Strength 5/5 upper and lower extremities bilaterally, no dysarthria Psych: Normal affect     Data Reviewed:  I have personally reviewed following labs    CBC Lab Results  Component Value Date   WBC 9.9 11/08/2022   RBC 5.33 (H)  11/08/2022   HGB 13.6 11/09/2022   HCT 40.0 11/09/2022   MCV 85.0 11/08/2022   MCH 28.1 11/08/2022   PLT 373 11/08/2022   MCHC 33.1 11/08/2022   RDW 13.3 11/08/2022   LYMPHSABS 2.4 11/08/2022   MONOABS 0.7 11/08/2022   EOSABS 0.2 11/08/2022   BASOSABS 0.0 50/93/2671     Last metabolic panel Lab Results  Component Value Date   NA 139 11/11/2022   K 3.6 11/11/2022   CL 106 11/11/2022   CO2 23 11/11/2022   BUN 15 11/11/2022   CREATININE 0.79 11/11/2022   GLUCOSE 93 11/11/2022   GFRNONAA >60 11/11/2022   CALCIUM 8.8 (L) 11/11/2022   ANIONGAP 10 11/11/2022    CBG (last 3)  No results for input(s): "GLUCAP" in the last 72 hours.    Coagulation Profile: Recent Labs  Lab 11/08/22 0210  INR 0.9     Radiology Studies: I have personally reviewed the imaging studies  No results found.     Estill Cotta M.D. [facility] 11/12/2022, 1:38 PM  Available via Epic secure chat 7am-7pm After 7 pm, please refer to night coverage provider listed on amion.

## 2022-11-12 NOTE — Progress Notes (Signed)
Physical Therapy Treatment Patient Details Name: Joy Shelton MRN: [id number] DOB: 02/21/1961 Today's Date: 11/12/2022   History of Present Illness 62 y.o. female who presents 11/08/22 with complaints of right-sided weakness and difficulty talking. MRI reveals acute lacunar infarcts in the central pons left greater than right.  PMH significant of hypercholesterolemia    PT Comments    Patient making progress towards goals. Continues to require use of RW to maintain balance during gait and continues with leftward drift (which she now notes and corrects without cuing). Requires cues for increased foot clearance and cadence. She maintains improved foot clearance after cued, however does not increase velocity appreciably (with velocity <1.8 ft/sec indicative of increased risk for falling). Static standing balance tasks improved, however continues to need close-guarding to min assist.     Recommendations for follow up therapy are one component of a multi-disciplinary discharge planning process, led by the attending physician.  Recommendations may be updated based on patient status, additional functional criteria and insurance authorization.  Follow Up Recommendations  Acute inpatient rehab (3hours/day)     Assistance Recommended at Discharge Intermittent Supervision/Assistance  Patient can return home with the following A little help with walking and/or transfers;Assistance with cooking/housework;Assist for transportation;Help with stairs or ramp for entrance   Equipment Recommendations  Rolling walker (2 wheels)    Recommendations for Other Services       Precautions / Restrictions Precautions Precautions: Fall Restrictions Weight Bearing Restrictions: No     Mobility  Bed Mobility Overal bed mobility: Needs Assistance Bed Mobility: Supine to Sit, Sit to Supine     Supine to sit: Supervision Sit to supine: Supervision   General bed mobility comments: incr time and effort,  HOB flat no rail    Transfers Overall transfer level: Needs assistance Equipment used: Rolling walker (2 wheels) Transfers: Sit to/from Stand Sit to Stand: Min guard           General transfer comment: requires verbal cues for safe hand placement to prevent pt from pulling up from RW.    Ambulation/Gait Ambulation/Gait assistance: Min assist Gait Distance (Feet): 150 Feet Assistive device: Rolling walker (2 wheels) Gait Pattern/deviations: Step-through pattern, Decreased stride length, Drifts right/left   Gait velocity interpretation: <1.8 ft/sec, indicate of risk for recurrent falls   General Gait Details: very poor foot clearance with shuffling gait initially; improved with vc to improve foot clearance (emphasizing heelstrike); very hesitant and slow. Still drifting to her left, but pt able to maneuver RW without assist.   Stairs             Wheelchair Mobility    Modified Rankin (Stroke Patients Only) Modified Rankin (Stroke Patients Only) Pre-Morbid Rankin Score: No symptoms Modified Rankin: Moderate disability     Balance Overall balance assessment: Needs assistance Sitting-balance support: No upper extremity supported, Feet supported Sitting balance-Leahy Scale: Good     Standing balance support: Bilateral upper extremity supported, No upper extremity supported, Reliant on assistive device for balance Standing balance-Leahy Scale: Poor Standing balance comment: requires/seeks UE support               High Level Balance Comments: performed rhomberg standing (EO, EC), semi tandem standing and SLS. Pt with incr sway with rhomberg (especially with EC) but held x 10 seconds. Semi-tandem with each leg forward with slight imbalance that pt recovered x 10 sec. SLS on each leg with min assist to maintain x 10 seconds  Cognition Arousal/Alertness: Awake/alert Behavior During Therapy: WFL for tasks assessed/performed Overall Cognitive Status:  Within Functional Limits for tasks assessed                                          Exercises      General Comments General comments (skin integrity, edema, etc.): Father present      Pertinent Vitals/Pain Pain Assessment Pain Assessment: No/denies pain    Home Living                          Prior Function            PT Goals (current goals can now be found in the care plan section) Acute Rehab PT Goals Patient Stated Goal: return to independent Time For Goal Achievement: 11/24/22 Potential to Achieve Goals: Good Progress towards PT goals: Progressing toward goals    Frequency    Min 4X/week      PT Plan Current plan remains appropriate    Co-evaluation              AM-PAC PT "6 Clicks" Mobility   Outcome Measure  Help needed turning from your back to your side while in a flat bed without using bedrails?: None Help needed moving from lying on your back to sitting on the side of a flat bed without using bedrails?: A Little Help needed moving to and from a bed to a chair (including a wheelchair)?: A Little Help needed standing up from a chair using your arms (e.g., wheelchair or bedside chair)?: A Little Help needed to walk in hospital room?: A Little Help needed climbing 3-5 steps with a railing? : A Lot 6 Click Score: 18    End of Session Equipment Utilized During Treatment: Gait belt Activity Tolerance: Patient tolerated treatment well Patient left: in bed;with call bell/phone within reach;with bed alarm set;with family/visitor present   PT Visit Diagnosis: Unsteadiness on feet (R26.81);Hemiplegia and hemiparesis Hemiplegia - Right/Left: Right Hemiplegia - dominant/non-dominant: Dominant Hemiplegia - caused by: Cerebral infarction     Time: 6269-4854 PT Time Calculation (min) (ACUTE ONLY): 16 min  Charges:  $Gait Training: 8-22 mins                      [place]  Office  [phone]    Rexanne Mano 11/12/2022, 12:03 PM

## 2022-11-12 NOTE — Progress Notes (Addendum)
STROKE TEAM PROGRESS NOTE   INTERVAL HISTORY 2 family members present at bedside.  Patient is sitting up in bed, in no acute distress. Neurologic exam stable, VSS    Patient was unable to tolerate CPAP last night for more than 4 hours and was a screen failure for the sleep smart study but, will try again tonight.   Insurance auths have been sent out for possible CIR admission.    Vitals:   11/12/22 0004 11/12/22 0318 11/12/22 0840 11/12/22 1130  BP: 110/66 (!) 137/114 (!) 126/57 (!) 119/50  Pulse: 71 70 81 67  Resp: 14 14 18 16   Temp:  98.1 F (36.7 C) 98 F (36.7 C) 98.5 F (36.9 C)  TempSrc:  Oral Oral Oral  SpO2: 97% 96% 98% 96%  Weight:      Height:       CBC:  Recent Labs  Lab 11/08/22 0210 11/09/22 1314  WBC 9.9  --   NEUTROABS 6.5  --   HGB 15.0 13.6  HCT 45.3 40.0  MCV 85.0  --   PLT 373  --     Basic Metabolic Panel:  Recent Labs  Lab 11/08/22 0210 11/09/22 1314 11/11/22 0328  NA 141 142 139  K 3.9 3.7 3.6  CL 106 108 106  CO2 28  --  23  GLUCOSE 127* 105* 93  BUN 11 9 15   CREATININE 0.77 0.60 0.79  CALCIUM 9.1  --  8.8*    Lipid Panel:  Recent Labs  Lab 11/09/22 1255  CHOL 208*  TRIG 66  HDL 56  CHOLHDL 3.7  VLDL 13  LDLCALC 139*    HgbA1c:  Recent Labs  Lab 11/09/22 1255  HGBA1C 6.1*    Urine Drug Screen: No results for input(s): "LABOPIA", "COCAINSCRNUR", "LABBENZ", "AMPHETMU", "THCU", "LABBARB" in the last 168 hours.  Alcohol Level  Recent Labs  Lab 11/08/22 0258  ETH <10     IMAGING past 24 hours No results found.  PHYSICAL EXAM  Physical Exam  Constitutional: A mildly obese middle-aged Caucasian lady sitting up in bed, in no acute distress Psych: Affect appropriate to situation, calm and cooperative with exam  Eyes: No scleral injection HENT: No OP obstrucion MSK: no joint deformities.  Cardiovascular: Normal rate and regular rhythm.  Respiratory: Effort normal, non-labored breathing  Neuro: Mental  Status: Patient is awake, alert, oriented to person, place, month, year, and situation.  Patient is able to give a clear and coherent history of present illness.  No signs of aphasia or neglect.  Minimal dysarthria noted.   Cranial Nerves: II: Visual Fields are full. Pupils are equal, round, and reactive to light.   III,IV, VI: EOMI without ptosis or diploplia.  V: Facial sensation is symmetric to light touch VII: Facial movement is symmetric VIII: Hearing is intact to voice X: Palate elevates symmetrically XI: Shoulder shrug is symmetric. XII: Tongue protrudes midline Motor: Tone/bulk is normal. 5/5 strength was present in all four extremities with no drift noted Sensory: Sensation is symmetric to light touch in the arms and legs.  Cerebellar: FNF and HKS are intact bilaterally  NIHSS 0 Premorbid MRS 0  ASSESSMENT/PLAN [name] is a 62 y.o. female with history of HLD and difficulty swallowing presenting with fluctuating parasthesia and transient speech disturbance with onset 11/04/2022. Patient was evaluated with an MRI brain on 12/31 that was negative for acute infarction and she was discharged home. Due to ongoing speech disturbance and generalized weakness, patient came  back to the ED for further evaluation on 11/08/22 after being discharged home earlier that day.   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  CT head 12/31: Normal head CT. CTA head & neck no LVO, subtle vessel wall irregularity in the mid cervical ICAs bilaterally suggesting fibromuscular dysplasia.  No significant stenosis is present.  Minimal calcifications at the right carotid bifurcation without significant stenosis.   MRI acute lacunar infarcts in the central pons, left greater than right.  No associated hemorrhage or mass effect. 2D Echo LVEF 60 to 65% no atrial level shunt detected. LDL 139 HgbA1c 6.1% VTE prophylaxis -Lovenox     Diet   Diet Heart Room service appropriate? Yes; Fluid consistency: Thin   No antithrombotic prior to admission, now on aspirin 81 mg daily and clopidogrel 75 mg daily for days followed by aspirin 81 mg PO monotherapy Therapy recommendations: CIR Disposition: Pending  Hyperlipidemia Home meds:  rosuvastatin 10 mg, changed to atorvastatin 80 mg PO daily  LDL 139, goal < 70 Continue statin at discharge  Other Stroke Risk Factors Obesity, Body mass index is 34.75 kg/m., BMI >/= 30 associated with increased stroke risk, recommend weight loss, diet and exercise as appropriate    Hospital day # 3  Pt seen by Neuro NP/APP and later by MD. Note/plan to be edited by MD as needed.    Otelia Santee, DNP, AGACNP-BC [facility] Please use AMION for pager and EPIC for messaging I have personally obtained history,examined this patient, reviewed notes, independently viewed imaging studies, participated in medical decision making and plan of care.ROS completed by me personally and pertinent positives fully documented  I have made any additions or clarifications directly to the above note. Agree with note above.  Patient was unable to tolerate CPAP mask last night long 4 hours but is willing to try again tonight otherwise.  She will be a screen failure in the sleep smart study.   Long discussion with patient and family members at bedside and answered questions.  Greater than 50% time during this 35-minute visit spent on counseling and coordination of care about lacunar strokes and sleep apnea and answering questions  Antony Contras, MD Medical Director [place] Pager: [phone] 11/12/2022 4:18 PM   To contact Stroke Continuity provider, please refer to [email or web address] After hours, contact General Neurology

## 2022-11-12 NOTE — Progress Notes (Signed)
Inpatient Rehabilitation Admissions Coordinator   I continue to await Auth for possible Cir admit.  Danne Baxter, RN, MSN Rehab Admissions Coordinator [phone] 11/12/2022 5:31 PM

## 2022-11-13 ENCOUNTER — Encounter (HOSPITAL_COMMUNITY): Payer: Self-pay | Admitting: Physical Medicine and Rehabilitation

## 2022-11-13 ENCOUNTER — Inpatient Hospital Stay (HOSPITAL_COMMUNITY)
Admission: RE | Admit: 2022-11-13 | Discharge: 2022-11-21 | DRG: 093 | Disposition: A | Discharge status: Home / Self Care | Payer: 59 | Source: Intra-hospital | Attending: Physical Medicine and Rehabilitation | Admitting: Physical Medicine and Rehabilitation

## 2022-11-13 ENCOUNTER — Other Ambulatory Visit: Payer: Self-pay

## 2022-11-13 DIAGNOSIS — Z713 Dietary counseling and surveillance: Secondary | ICD-10-CM

## 2022-11-13 DIAGNOSIS — Z6834 Body mass index (BMI) 34.0-34.9, adult: Secondary | ICD-10-CM

## 2022-11-13 DIAGNOSIS — I959 Hypotension, unspecified: Secondary | ICD-10-CM | POA: Diagnosis not present

## 2022-11-13 DIAGNOSIS — E669 Obesity, unspecified: Secondary | ICD-10-CM | POA: Diagnosis present

## 2022-11-13 DIAGNOSIS — H5712 Ocular pain, left eye: Secondary | ICD-10-CM | POA: Diagnosis present

## 2022-11-13 DIAGNOSIS — F419 Anxiety disorder, unspecified: Secondary | ICD-10-CM | POA: Diagnosis present

## 2022-11-13 DIAGNOSIS — G4733 Obstructive sleep apnea (adult) (pediatric): Secondary | ICD-10-CM

## 2022-11-13 DIAGNOSIS — K5900 Constipation, unspecified: Secondary | ICD-10-CM | POA: Diagnosis not present

## 2022-11-13 DIAGNOSIS — R269 Unspecified abnormalities of gait and mobility: Secondary | ICD-10-CM | POA: Diagnosis present

## 2022-11-13 DIAGNOSIS — G4730 Sleep apnea, unspecified: Secondary | ICD-10-CM | POA: Diagnosis present

## 2022-11-13 DIAGNOSIS — I6381 Other cerebral infarction due to occlusion or stenosis of small artery: Secondary | ICD-10-CM | POA: Diagnosis not present

## 2022-11-13 DIAGNOSIS — I639 Cerebral infarction, unspecified: Secondary | ICD-10-CM

## 2022-11-13 DIAGNOSIS — Z79899 Other long term (current) drug therapy: Secondary | ICD-10-CM

## 2022-11-13 DIAGNOSIS — E7849 Other hyperlipidemia: Secondary | ICD-10-CM

## 2022-11-13 DIAGNOSIS — R110 Nausea: Secondary | ICD-10-CM | POA: Diagnosis not present

## 2022-11-13 DIAGNOSIS — R195 Other fecal abnormalities: Secondary | ICD-10-CM

## 2022-11-13 DIAGNOSIS — G44209 Tension-type headache, unspecified, not intractable: Secondary | ICD-10-CM | POA: Diagnosis not present

## 2022-11-13 DIAGNOSIS — I69322 Dysarthria following cerebral infarction: Secondary | ICD-10-CM

## 2022-11-13 DIAGNOSIS — K649 Unspecified hemorrhoids: Secondary | ICD-10-CM | POA: Diagnosis present

## 2022-11-13 DIAGNOSIS — I6300 Cerebral infarction due to thrombosis of unspecified precerebral artery: Secondary | ICD-10-CM | POA: Diagnosis not present

## 2022-11-13 DIAGNOSIS — E7800 Pure hypercholesterolemia, unspecified: Secondary | ICD-10-CM | POA: Diagnosis present

## 2022-11-13 DIAGNOSIS — R531 Weakness: Secondary | ICD-10-CM | POA: Diagnosis not present

## 2022-11-13 MED ORDER — ACETAMINOPHEN 325 MG PO TABS: 325.0000 mg | ORAL_TABLET | ORAL | Status: DC | PRN

## 2022-11-13 MED ORDER — PROCHLORPERAZINE MALEATE 5 MG PO TABS: 5.0000 mg | ORAL_TABLET | Freq: Four times a day (QID) | ORAL | Status: DC | PRN

## 2022-11-13 MED ORDER — ALUM & MAG HYDROXIDE-SIMETH 200-200-20 MG/5ML PO SUSP: 30.0000 mL | ORAL | Status: DC | PRN

## 2022-11-13 MED ORDER — SACCHAROMYCES BOULARDII 250 MG PO CAPS
250.0000 mg | ORAL_CAPSULE | Freq: Two times a day (BID) | ORAL | Status: DC
  Administered 2022-11-13 – 2022-11-21 (×16): 250 mg via ORAL
  Filled 2022-11-13 (×16): qty 1

## 2022-11-13 MED ORDER — SORBITOL 70 % SOLN: 30.0000 mL | Freq: Every day | Status: DC | PRN

## 2022-11-13 MED ORDER — ASPIRIN 81 MG PO TBEC: 81.0000 mg | DELAYED_RELEASE_TABLET | Freq: Every day | ORAL | 12 refills | Status: DC

## 2022-11-13 MED ORDER — ENOXAPARIN SODIUM 40 MG/0.4ML IJ SOSY
40.0000 mg | PREFILLED_SYRINGE | INTRAMUSCULAR | Status: DC
  Administered 2022-11-14 – 2022-11-18 (×5): 40 mg via SUBCUTANEOUS
  Filled 2022-11-13 (×5): qty 0.4

## 2022-11-13 MED ORDER — METHOCARBAMOL 500 MG PO TABS: 500.0000 mg | ORAL_TABLET | Freq: Four times a day (QID) | ORAL | Status: DC | PRN

## 2022-11-13 MED ORDER — FLEET ENEMA 7-19 GM/118ML RE ENEM: 1.0000 | ENEMA | Freq: Once | RECTAL | Status: DC | PRN

## 2022-11-13 MED ORDER — PANTOPRAZOLE SODIUM 40 MG PO TBEC
40.0000 mg | DELAYED_RELEASE_TABLET | Freq: Every day | ORAL | Status: DC
  Administered 2022-11-14 – 2022-11-21 (×8): 40 mg via ORAL
  Filled 2022-11-13 (×8): qty 1

## 2022-11-13 MED ORDER — DIPHENHYDRAMINE HCL 12.5 MG/5ML PO ELIX: 12.5000 mg | ORAL_SOLUTION | Freq: Four times a day (QID) | ORAL | Status: DC | PRN

## 2022-11-13 MED ORDER — PROCHLORPERAZINE 25 MG RE SUPP: 12.5000 mg | Freq: Four times a day (QID) | RECTAL | Status: DC | PRN

## 2022-11-13 MED ORDER — CLOPIDOGREL BISULFATE 75 MG PO TABS
75.0000 mg | ORAL_TABLET | Freq: Every day | ORAL | Status: DC
  Administered 2022-11-14 – 2022-11-21 (×8): 75 mg via ORAL
  Filled 2022-11-13 (×8): qty 1

## 2022-11-13 MED ORDER — LOPERAMIDE HCL 2 MG PO CAPS: 2.0000 mg | ORAL_CAPSULE | Freq: Four times a day (QID) | ORAL | Status: DC | PRN

## 2022-11-13 MED ORDER — TRAZODONE HCL 50 MG PO TABS: 25.0000 mg | ORAL_TABLET | Freq: Every evening | ORAL | Status: DC | PRN

## 2022-11-13 MED ORDER — ROSUVASTATIN CALCIUM 40 MG PO TABS: 40.0000 mg | ORAL_TABLET | Freq: Every day | ORAL | 4 refills | Status: DC

## 2022-11-13 MED ORDER — GUAIFENESIN-DM 100-10 MG/5ML PO SYRP: 5.0000 mL | ORAL_SOLUTION | Freq: Four times a day (QID) | ORAL | Status: DC | PRN

## 2022-11-13 MED ORDER — MAGNESIUM HYDROXIDE 400 MG/5ML PO SUSP
30.0000 mL | Freq: Every day | ORAL | Status: DC | PRN
  Administered 2022-11-17: 30 mL via ORAL
  Filled 2022-11-13: qty 30

## 2022-11-13 MED ORDER — ASPIRIN 81 MG PO TBEC
81.0000 mg | DELAYED_RELEASE_TABLET | Freq: Every day | ORAL | Status: DC
  Administered 2022-11-14 – 2022-11-21 (×8): 81 mg via ORAL
  Filled 2022-11-13 (×8): qty 1

## 2022-11-13 MED ORDER — ROSUVASTATIN CALCIUM 20 MG PO TABS
40.0000 mg | ORAL_TABLET | Freq: Every day | ORAL | Status: DC
  Administered 2022-11-14 – 2022-11-21 (×8): 40 mg via ORAL
  Filled 2022-11-13 (×8): qty 2

## 2022-11-13 MED ORDER — PANTOPRAZOLE SODIUM 40 MG PO TBEC: 40.0000 mg | DELAYED_RELEASE_TABLET | Freq: Every day | ORAL | 3 refills | Status: DC

## 2022-11-13 MED ORDER — CLOPIDOGREL BISULFATE 75 MG PO TABS: 75.0000 mg | ORAL_TABLET | Freq: Every day | ORAL | 0 refills | Status: DC

## 2022-11-13 MED ORDER — PANTOPRAZOLE SODIUM 40 MG PO TBEC
40.0000 mg | DELAYED_RELEASE_TABLET | Freq: Every day | ORAL | Status: DC
  Administered 2022-11-13: 40 mg via ORAL
  Filled 2022-11-13: qty 1

## 2022-11-13 MED ORDER — VITAMIN D 25 MCG (1000 UNIT) PO TABS
1000.0000 [IU] | ORAL_TABLET | Freq: Every day | ORAL | Status: DC
  Administered 2022-11-13 – 2022-11-17 (×5): 1000 [IU] via ORAL
  Filled 2022-11-13 (×5): qty 1

## 2022-11-13 MED ORDER — PROCHLORPERAZINE EDISYLATE 10 MG/2ML IJ SOLN: 5.0000 mg | Freq: Four times a day (QID) | INTRAMUSCULAR | Status: DC | PRN

## 2022-11-13 MED ORDER — ENOXAPARIN SODIUM 40 MG/0.4ML IJ SOSY: 40.0000 mg | PREFILLED_SYRINGE | INTRAMUSCULAR | Status: DC

## 2022-11-13 NOTE — Discharge Summary (Signed)
Physician Discharge Summary   Patient: Joy Shelton MRN: [id number] DOB: 1961/07/15  Admit date:     11/08/2022  Discharge date: 11/13/22  Discharge Physician: Joy Cotta, MD    PCP: Joy Stains, MD   Recommendations at discharge:   Per neurology, continue aspirin 81 mg daily, Plavix 75 mg daily for 21 days then continue aspirin alone Placed on Crestor 40 mg daily (patient has not tolerated Lipitor in the past) Continue Protonix 40 mg daily  Discharge Diagnoses:    Acute CVA (cerebral vascular accident) ([place]) GERD   Hyperlipidemia   Obesity (BMI 30-39.9)    Hospital Course:  Patient is a 62 year old female with history of hyperlipidemia who presented here with right-sided weakness, slurred speech.  She was earlier seen in the ED with complaints of numbness/tingling on bilateral hands but at that time MRI was negative and she was discharged home.  Came back to the ED with weakness on the right side.  Speech was slurred.    MRI showed acute lacunar infarct in the central pons left greater than right.  PT/OT recommending acute inpatient rehab.      Assessment and Plan:  Acute ischemic CVA (cerebral vascular accident) ([place]) -Initially presented with numbness of bilateral upper extremities to the ED on 11/08/22 however MRI was negative for acute intracranial abnormality -Patient returned back with persisting symptoms, repeat MRI on 11/09/2022 showed acute lacunar infarct in the central pons left> right, no associated hemorrhage or mass effect -CTA head and neck showed no emergent large vessel occlusions -2D echo: EF of 60 to [id number], normal diastolic parameters, no intracardiac source of emboli -Hb A1c 6.1, LDL 139, placed on statin - PT recommended inpatient rehab -Neurology recommended aspirin 81 mg daily, Plavix 75 mg daily for 3 weeks then continue aspirin 81 mg p.o. monotherapy      Hyperlipidemia -LDL 139, patient was placed on Lipitor however she reported prior  intolerance to Lipitor and has tolerated Crestor.  Requesting change to Crestor -Changed to Crestor 40 mg daily (was on 10 mg daily at home)   GERD -Complaining of nausea in the mornings, placed on Protonix 40 mg daily       Obesity (BMI 30-39.9) Estimated body mass index is 34.75 kg/m as calculated from the following:   Height as of this encounter: 5\' 2"  (1.575 m).   Weight as of this encounter: 86.2 kg.       Pain control - [employer] Controlled Substance Reporting System database was reviewed. and patient was instructed, not to drive, operate heavy machinery, perform activities at heights, swimming or participation in water activities or provide baby-sitting services while on Pain, Sleep and Anxiety Medications; until their outpatient Physician has advised to do so again. Also recommended to not to take more than prescribed Pain, Sleep and Anxiety Medications.  Consultants: Neurology, CIR Procedures performed: MRI brain, CTA head and neck, 2D echo Disposition: Inpatient rehab Diet recommendation:  Heart healthy DISCHARGE MEDICATION: Allergies as of 11/13/2022   No Known Allergies      Medication List     TAKE these medications    acetaminophen 500 MG tablet Commonly known as: TYLENOL Take 1,000 mg by mouth every 6 (six) hours as needed for moderate pain.   aspirin EC 81 MG tablet Take 1 tablet (81 mg total) by mouth daily. Swallow whole. Start taking on: November 14, 2022   clopidogrel 75 MG tablet Commonly known as: PLAVIX Take 1 tablet (75 mg total) by mouth daily for  21 days. Start taking on: November 14, 2022   pantoprazole 40 MG tablet Commonly known as: PROTONIX Take 1 tablet (40 mg total) by mouth daily.   rosuvastatin 40 MG tablet Commonly known as: CRESTOR Take 1 tablet (40 mg total) by mouth daily. Start taking on: November 14, 2022 What changed:  medication strength how much to take   VITAMIN D PO Take 1 tablet by mouth daily.        Follow-up  Information     Joy Stains, MD. Schedule an appointment as soon as possible for a visit in 2 week(s).   Specialty: Family Medicine Why: for hospital follow-up Contact information: [place] [address] [phone]         [place]. Schedule an appointment as soon as possible for a visit in 4 week(s).   Why: for hospital follow-up Contact information: [address]     [address] [place] [address] [phone]               Discharge Exam: Danley Danker Weights   11/09/22 1544  Weight: 86.2 kg   S: Weakness on the right improving, also reported nausea in the mornings otherwise no vomiting, abdominal pain.  BP 121/60 (BP Location: Right Arm)   Pulse 81   Temp 98.6 F (37 C) (Oral)   Resp 20   Ht 5\' 2"  (1.575 m)   Wt 86.2 kg   SpO2 97%   BMI 34.75 kg/m   Physical Exam General: Alert and oriented x 3, NAD Cardiovascular: S1 S2 clear, RRR.  Respiratory: CTAB, no wheezing, rales or rhonchi Gastrointestinal: Soft, nontender, nondistended, NBS Ext: no pedal edema bilaterally Neuro: no new deficits   Condition at discharge: fair  The results of significant diagnostics from this hospitalization (including imaging, microbiology, ancillary and laboratory) are listed below for reference.   Imaging Studies: ECHOCARDIOGRAM COMPLETE  Result Date: 11/10/2022    ECHOCARDIOGRAM REPORT   Patient Name:   Joy Shelton Date of Exam: 11/10/2022 Medical Rec #:  [id number]        Height:       62.0 in Accession #:    [id number]       Weight:       190.0 lb Date of Birth:  December 05, 1960        BSA:          1.870 m Patient Age:    3 years         BP:           126/71 mmHg Patient Gender: F                HR:           59 bpm. Exam Location:  Inpatient Procedure: 2D Echo, Color Doppler and Cardiac Doppler Indications:    Stroke  History:        Patient has no prior history of Echocardiogram examinations.                  Risk Factors:Obesity. Hx of hypercholesteremia.  Sonographer:    Eartha Inch Referring Phys: [id number] RONDELL A SMITH  Sonographer Comments: Image acquisition challenging due to patient body habitus and Image acquisition challenging due to respiratory motion. IMPRESSIONS  1. Left ventricular ejection fraction, by estimation, is 60 to 65%. The left ventricle has normal function. The left ventricle has no regional wall motion abnormalities. Left ventricular diastolic parameters were normal.  2. Right  ventricular systolic function is normal. The right ventricular size is normal.  3. The mitral valve is normal in structure. No evidence of mitral valve regurgitation. No evidence of mitral stenosis.  4. The aortic valve is normal in structure. Aortic valve regurgitation is not visualized. No aortic stenosis is present.  5. The inferior vena cava is normal in size with greater than 50% respiratory variability, suggesting right atrial pressure of 3 mmHg. FINDINGS  Left Ventricle: Left ventricular ejection fraction, by estimation, is 60 to 65%. The left ventricle has normal function. The left ventricle has no regional wall motion abnormalities. The left ventricular internal cavity size was normal in size. There is  no left ventricular hypertrophy. Left ventricular diastolic parameters were normal. Right Ventricle: The right ventricular size is normal. No increase in right ventricular wall thickness. Right ventricular systolic function is normal. Left Atrium: Left atrial size was normal in size. Right Atrium: Right atrial size was normal in size. Pericardium: There is no evidence of pericardial effusion. Mitral Valve: The mitral valve is normal in structure. No evidence of mitral valve regurgitation. No evidence of mitral valve stenosis. MV peak gradient, 3.3 mmHg. The mean mitral valve gradient is 1.0 mmHg. Tricuspid Valve: The tricuspid valve is normal in structure. Tricuspid valve regurgitation is not demonstrated.  No evidence of tricuspid stenosis. Aortic Valve: The aortic valve is normal in structure. Aortic valve regurgitation is not visualized. No aortic stenosis is present. Pulmonic Valve: The pulmonic valve was normal in structure. Pulmonic valve regurgitation is not visualized. No evidence of pulmonic stenosis. Aorta: The aortic root is normal in size and structure. Venous: The inferior vena cava is normal in size with greater than 50% respiratory variability, suggesting right atrial pressure of 3 mmHg. IAS/Shunts: No atrial level shunt detected by color flow Doppler.  LEFT VENTRICLE PLAX 2D LVIDd:         3.90 cm     Diastology LVIDs:         2.70 cm     LV e' medial:    6.99 cm/s LV PW:         0.90 cm     LV E/e' medial:  14.0 LV IVS:        0.70 cm     LV e' lateral:   9.17 cm/s LVOT diam:     1.80 cm     LV E/e' lateral: 10.7 LV SV:         65 LV SV Index:   35 LVOT Area:     2.54 cm  LV Volumes (MOD) LV vol d, MOD A2C: 40.7 ml LV vol d, MOD A4C: 73.7 ml LV vol s, MOD A2C: 15.1 ml LV vol s, MOD A4C: 25.7 ml LV SV MOD A2C:     25.6 ml LV SV MOD A4C:     73.7 ml LV SV MOD BP:      35.6 ml RIGHT VENTRICLE             IVC RV S prime:     10.70 cm/s  IVC diam: 1.50 cm TAPSE (M-mode): 1.9 cm LEFT ATRIUM             Index        RIGHT ATRIUM           Index LA diam:        3.50 cm 1.87 cm/m   RA Area:     11.80 cm LA Vol (A2C):   27.5 ml 14.70  ml/m  RA Volume:   23.40 ml  12.51 ml/m LA Vol (A4C):   31.6 ml 16.89 ml/m LA Biplane Vol: 30.1 ml 16.09 ml/m  AORTIC VALVE LVOT Vmax:   109.00 cm/s LVOT Vmean:  80.400 cm/s LVOT VTI:    0.257 m  AORTA Ao Root diam: 2.70 cm Ao Asc diam:  2.90 cm MITRAL VALVE MV Area (PHT): 3.15 cm    SHUNTS MV Area VTI:   2.17 cm    Systemic VTI:  0.26 m MV Peak grad:  3.3 mmHg    Systemic Diam: 1.80 cm MV Mean grad:  1.0 mmHg MV Vmax:       0.90 m/s MV Vmean:      54.7 cm/s MV Decel Time: 241 msec MV E velocity: 98.00 cm/s MV A velocity: 70.80 cm/s MV E/A ratio:  1.38 Mihai Croitoru MD  Electronically signed by Sanda Klein MD Signature Date/Time: 11/10/2022/9:13:50 AM    Final    CT ANGIO HEAD NECK W WO CM  Result Date: 11/09/2022 CLINICAL DATA:  Right-sided weakness and difficulty speaking. Acute lacunar infarcts of the central pons, left greater than right. EXAM: CT ANGIOGRAPHY HEAD AND NECK TECHNIQUE: Multidetector CT imaging of the head and neck was performed using the standard protocol during bolus administration of intravenous contrast. Multiplanar CT image reconstructions and MIPs were obtained to evaluate the vascular anatomy. Carotid stenosis measurements (when applicable) are obtained utilizing NASCET criteria, using the distal internal carotid diameter as the denominator. RADIATION DOSE REDUCTION: This exam was performed according to the departmental dose-optimization program which includes automated exposure control, adjustment of the mA and/or kV according to patient size and/or use of iterative reconstruction technique. CONTRAST:  64mL OMNIPAQUE IOHEXOL 350 MG/ML SOLN COMPARISON:  MR head without and with contrast 11/09/2022 FINDINGS: CTA NECK FINDINGS Aortic arch: A 3 vessel arch configuration is present. Minimal calcifications are present in the proximal left subclavian artery. No significant stenosis or aneurysm is present. Right carotid system: The right common carotid artery is within normal limits. Minimal calcifications are present at the right carotid bifurcation. Subtle vessel wall irregularity is present in the mid cervical right ICA. No significant stenosis is present. Left carotid system: Left common carotid artery is within normal limits. Bifurcation is unremarkable. Subtle vessel wall irregularity is present in mid cervical left ICA without significant stenosis. Vertebral arteries: The right vertebral artery is dominant. Both vertebral arteries originate from the subclavian arteries without significant stenosis. No significant stenosis is present in either vertebral  artery in the neck. Skeleton: Vertebral body heights and alignment are normal. No focal osseous lesions are present. Other neck: Soft tissues the neck are otherwise unremarkable. Salivary glands are within normal limits. Thyroid is normal. No significant adenopathy is present. No focal mucosal or submucosal lesions are present. Upper chest: Patchy ground-glass attenuation is present at both lung apices. No nodule or mass lesion is present. No significant pleural disease is present. Review of the MIP images confirms the above findings CTA HEAD FINDINGS Anterior circulation: The internal carotid arteries are within normal limits from the high cervical segments through the ICA termini. The A1 and M1 segments are normal. The anterior communicating artery is patent. The MCA bifurcations are normal bilaterally. The ACA and MCA branch vessels are within normal limits bilaterally. No aneurysm is present. Posterior circulation: The right PICA origin is at the dural margin. Fenestration is present at the vertebrobasilar junction. No aneurysm is present. The basilar artery is normal. Both posterior cerebral arteries originate  from the basilar tip. The PCA branch vessels are within normal limits bilaterally. Venous sinuses: The dural sinuses are patent. The straight sinus and deep cerebral veins are intact. Cortical veins are within normal limits. No significant vascular malformation is evident. Anatomic variants: None Review of the MIP images confirms the above findings IMPRESSION: 1. No emergent large vessel occlusions. Normal variant CTA Circle of Willis without proximal stenosis, aneurysm or branch vessel occlusion. 2. Subtle vessel wall irregularity in the mid cervical internal carotid arteries bilaterally suggesting fibromuscular dysplasia. No significant stenosis is present. 3. Minimal calcifications at the right carotid bifurcation without significant stenosis. 4. Patchy ground-glass attenuation at both lung apices.  This could represent atelectasis or edema. Infection is not excluded. The above was relayed via text pager to Dr. Lesleigh Noe on 11/09/2022 at 13:59. Electronically Signed   By: San Morelle M.D.   On: 11/09/2022 13:59   MR Brain W and Wo Contrast  Addendum Date: 11/09/2022   ADDENDUM REPORT: 11/09/2022 13:02 ADDENDUM: Study discussed by telephone with Dr. Tamala Julian on 11/09/2022 at 1259 hours. Electronically Signed   By: Genevie Ann M.D.   On: 11/09/2022 13:02   Result Date: 11/09/2022 CLINICAL DATA:  62 year old female with neurologic deficit initially presenting with headache and numbness yesterday. New onset right side weakness and difficulty speaking since evaluation yesterday. Unrevealing MRI yesterday prior to the new symptoms. EXAM: MRI HEAD WITHOUT AND WITH CONTRAST TECHNIQUE: Multiplanar, multiecho pulse sequences of the brain and surrounding structures were obtained without and with intravenous contrast. CONTRAST:  74mL GADAVIST GADOBUTROL 1 MMOL/ML IV SOLN COMPARISON:  Brain MRI 11/08/2022 and earlier. FINDINGS: Brain: Patchy and linear restricted diffusion now in the left paracentral pons in an area of about 12 mm. But also subtle contralateral right central pontine restricted diffusion. See series 3, image 17. This is basilar artery perforator branch territory, and these were not apparent yesterday. Associated new T2 heterogeneity within the pons. No associated hemorrhage, significant pontine expansion, or mass effect. Only subtle FLAIR hyperintensity in the left central pons at this time (series 6, image 11). No abnormal enhancement identified. No other restricted diffusion. No midline shift, mass effect, evidence of mass lesion, ventriculomegaly, extra-axial collection or acute intracranial hemorrhage. Cervicomedullary junction and pituitary are within normal limits. Background cerebral volume remains normal. Outside of the brainstem gray and white matter signal is stable and normal for age. No  cortical encephalomalacia or chronic cerebral blood products identified. No dural thickening. Vascular: Major intracranial vascular flow voids are stable. Following contrast the major dural venous sinuses are enhancing and appear to be patent. And furthermore, Skull and upper cervical spine: Visible cervical spine remains normal. Visualized bone marrow signal is within normal limits. Sinuses/Orbits: Stable and negative. Other: Visible internal auditory structures appear normal. Negative visible scalp and face. IMPRESSION: 1. 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 No associated hemorrhage or mass effect. 2. Elsewhere stable and normal for age MRI appearance of the brain. Electronically Signed: By: Genevie Ann M.D. On: 11/09/2022 12:52   MR BRAIN WO CONTRAST  Result Date: 11/08/2022 CLINICAL DATA:  62 year old female with headache and altered mental status. EXAM: MRI HEAD WITHOUT CONTRAST TECHNIQUE: Multiplanar, multiecho pulse sequences of the brain and surrounding structures were obtained without intravenous contrast. COMPARISON:  Head CT 0315 hours today. FINDINGS: Brain: Cerebral volume is within normal limits for age. No convincing restricted diffusion. No midline shift, mass effect, evidence of mass lesion, ventriculomegaly, extra-axial  collection or acute intracranial hemorrhage. Cervicomedullary junction and pituitary are within normal limits. Pearline Cables and white matter signal is within normal limits throughout the brain. No encephalomalacia or chronic cerebral blood products identified. Vascular: Major intracranial vascular flow voids are preserved. Skull and upper cervical spine: Negative visible cervical spine. Visualized bone marrow signal is within normal limits. Sinuses/Orbits: Negative orbits. Paranasal sinuses and mastoids appear clear. Other: Grossly negative visible internal auditory structures. Negative visible scalp and face. IMPRESSION: No acute  intracranial abnormality. Normal for age noncontrast MRI appearance of the Brain. Electronically Signed   By: Genevie Ann M.D.   On: 11/08/2022 11:08   CT Head Wo Contrast  Result Date: 11/08/2022 CLINICAL DATA:  Headache EXAM: CT HEAD WITHOUT CONTRAST TECHNIQUE: Contiguous axial images were obtained from the base of the skull through the vertex without intravenous contrast. RADIATION DOSE REDUCTION: This exam was performed according to the departmental dose-optimization program which includes automated exposure control, adjustment of the mA and/or kV according to patient size and/or use of iterative reconstruction technique. COMPARISON:  12/16/2020 FINDINGS: Brain: There is no mass, hemorrhage or extra-axial collection. The size and configuration of the ventricles and extra-axial CSF spaces are normal. The brain parenchyma is normal, without acute or chronic infarction. Vascular: No abnormal hyperdensity of the major intracranial arteries or dural venous sinuses. No intracranial atherosclerosis. Skull: The visualized skull base, calvarium and extracranial soft tissues are normal. Sinuses/Orbits: No fluid levels or advanced mucosal thickening of the visualized paranasal sinuses. No mastoid or middle ear effusion. The orbits are normal. IMPRESSION: Normal head CT. Electronically Signed   By: Ulyses Jarred M.D.   On: 11/08/2022 03:19    Microbiology: No results found for this or any previous visit.  Labs: CBC: Recent Labs  Lab 11/08/22 0210 11/09/22 1314  WBC 9.9  --   NEUTROABS 6.5  --   HGB 15.0 13.6  HCT 45.3 40.0  MCV 85.0  --   PLT 373  --    Basic Metabolic Panel: Recent Labs  Lab 11/08/22 0210 11/09/22 1314 11/11/22 0328  NA 141 142 139  K 3.9 3.7 3.6  CL 106 108 106  CO2 28  --  23  GLUCOSE 127* 105* 93  BUN 11 9 15   CREATININE 0.77 0.60 0.79  CALCIUM 9.1  --  8.8*   Liver Function Tests: No results for input(s): "AST", "ALT", "ALKPHOS", "BILITOT", "PROT", "ALBUMIN" in the  last 168 hours. CBG: Recent Labs  Lab 11/08/22 0212  GLUCAP 126*    Discharge time spent: greater than 30 minutes.  Signed: Estill Cotta, MD [facility] 11/13/2022

## 2022-11-13 NOTE — Progress Notes (Signed)
Patient has home CPAP set up at bedside.  RT available if assistance needed.

## 2022-11-13 NOTE — Progress Notes (Signed)
Inpatient Rehabilitation Admissions Coordinator   I have insurance approval and CIR bed to admit her today. I have alerted acute team and TOC and will make the arrangements to admit today.  Danne Baxter, RN, MSN Rehab Admissions Coordinator [phone] 11/13/2022 1:38 PM

## 2022-11-13 NOTE — H&P (Signed)
Physical Medicine and Rehabilitation Admission H&P    CC: Functional deficits there is secondary to acute lacunar infarctions  HPI: Joy Shelton is a 62 year old female who initially presented to the ED with fluctuating paraesthesia and transient speech disturbance on 11/04/2022.  MRI was negative on 12/31 for acute infarction and she was discharged home.  Due to ongoing speech disturbance and generalized weakness, she returned back to the ED for further evaluation on 11/08/2022.  A repeat MRI of the brain was positive for acute lacunar infarcts in the central pons left greater than right.  There was no associated hemorrhage or mass effect.  CTA of the head and neck was also performed without emergent large vessel occlusions.  She was started on DAPT.  Lovenox started for DVT prophylaxis. She complained of morning nausea and was started on Protonix. Discussed switching to atorvastatin but she stated she was intolerant. Her home dose of rosuvastatin was increased to 40 mg daily. She is tolerating heart healthy diet. The patient requires inpatient physical medicine and rehabilitation evaluations and treatment secondary to dysfunction due to lacunar infarcts.  She did participate in the Sleep Smart clinical trial and had overnight NOx 3 monitor for sleep apnea and tested positive and will have CPAP mask tolerability test. 2D echo: EF of 60 to 65%, normal diastolic parameters, no intracardiac source of emboli. Hb A1c 6.1, LDL 139,.  Review of Systems  Constitutional:  Negative for chills and fever.  HENT:  Negative for hearing loss and tinnitus.   Eyes:  Negative for blurred vision, double vision and pain.  Respiratory:  Negative for cough and hemoptysis.   Cardiovascular:  Negative for chest pain and palpitations.  Gastrointestinal:  Positive for diarrhea. Negative for nausea and vomiting.  Genitourinary:  Negative for dysuria and urgency.  Musculoskeletal:  Negative for myalgias and neck pain.   Skin:  Negative for rash.  Neurological:  Positive for headaches. Negative for sensory change and speech change.  Psychiatric/Behavioral:  Negative for depression and suicidal ideas.    Past Medical History:  Diagnosis Date   Hypercholesteremia    History reviewed. No pertinent surgical history. History reviewed. No pertinent family history. Social History:  reports that she has never smoked. She has never used smokeless tobacco. She reports that she does not currently use alcohol. She reports that she does not use drugs. Allergies: No Known Allergies Medications Prior to Admission  Medication Sig Dispense Refill   acetaminophen (TYLENOL) 500 MG tablet Take 1,000 mg by mouth every 6 (six) hours as needed for moderate pain.     rosuvastatin (CRESTOR) 10 MG tablet Take 10 mg by mouth daily.     VITAMIN D PO Take 1 tablet by mouth daily.        Home: Home Living Family/patient expects to be discharged to:: Private residence Living Arrangements: Alone Available Help at Discharge: Family, Available 24 hours/day Type of Home: House Home Access: Stairs to enter [employer] of Steps: 1 Entrance Stairs-Rails: None Home Layout: One level Bathroom Shower/Tub: Engineer, manufacturing systems: Handicapped height Bathroom Accessibility: Yes Home Equipment: Agricultural consultant (2 wheels), [employer] - single point (belong to her father) Additional Comments: above information for her home; may go stay with father  Lives With:  (alone)   Functional History: Prior Function Prior Level of Function : Independent/Modified Independent ADLs Comments: drives; works as an Field seismologist Status:  Mobility: Bed Mobility Overal bed mobility: Needs Assistance Bed Mobility: Supine to Sit, Sit  to Supine Supine to sit: Supervision Sit to supine: Supervision General bed mobility comments: incr time and effort, HOB flat no rail Transfers Overall transfer level: Needs assistance Equipment  used: Rolling walker (2 wheels) Transfers: Sit to/from Stand Sit to Stand: Min guard General transfer comment: requires verbal cues for safe hand placement to prevent pt from pulling up from RW. Ambulation/Gait Ambulation/Gait assistance: Min assist Gait Distance (Feet): 150 Feet Assistive device: Rolling walker (2 wheels) Gait Pattern/deviations: Step-through pattern, Decreased stride length, Drifts right/left General Gait Details: very poor foot clearance with shuffling gait initially; improved with vc to improve foot clearance (emphasizing heelstrike); very hesitant and slow. Still drifting to her left, but pt able to maneuver RW without assist. Gait velocity interpretation: <1.8 ft/sec, indicate of risk for recurrent falls    ADL: ADL Overall ADL's : Needs assistance/impaired Eating/Feeding: Set up, Supervision/ safety, Sitting Grooming: Set up, Supervision/safety, Sitting Upper Body Bathing: Set up, Supervision/ safety, Sitting Lower Body Bathing: Minimal assistance Upper Body Dressing : Set up, Supervision/safety, Sitting Lower Body Dressing: Minimal assistance, Sit to/from stand Toilet Transfer: Minimal assistance, Ambulation Toileting- Clothing Manipulation and Hygiene: Minimal assistance Functional mobility during ADLs: Minimal assistance General ADL Comments: very rigid posture adn shuffling gait; unable to bend forward to retrienve any object at knee level; complains of dizziness with bending forward  Cognition: Cognition Overall Cognitive Status: Within Functional Limits for tasks assessed Arousal/Alertness: Awake/alert Orientation Level: Oriented X4 Year: 2024 Month: January Day of Week: Correct Attention: Sustained, Selective Sustained Attention: Appears intact Selective Attention: Appears intact Memory: Appears intact Awareness: Appears intact Problem Solving: Appears intact Comments: stated able to use cell phone (in lieu of office phone) when dad brings  charger for her Cognition Arousal/Alertness: Awake/alert Behavior During Therapy: WFL for tasks assessed/performed Overall Cognitive Status: Within Functional Limits for tasks assessed  Physical Exam: Blood pressure 121/60, pulse 81, temperature 98.6 F (37 C), temperature source Oral, resp. rate 20, height 5\' 2"  (1.575 m), weight 86.2 kg, SpO2 97 %. Physical Exam Constitutional:      General: She is not in acute distress.    Appearance: She is obese. She is not ill-appearing.  HENT:     Head: Normocephalic and atraumatic.     Right Ear: External ear normal.     Left Ear: External ear normal.     Nose: Nose normal.     Mouth/Throat:     Mouth: Mucous membranes are moist.  Eyes:     General: No scleral icterus.    Extraocular Movements: Extraocular movements intact.     Conjunctiva/sclera: Conjunctivae normal.     Pupils: Pupils are equal, round, and reactive to light.  Cardiovascular:     Rate and Rhythm: Normal rate and regular rhythm.     Heart sounds: No murmur heard.    No gallop.  Pulmonary:     Effort: Pulmonary effort is normal. No respiratory distress.     Breath sounds: No wheezing.  Abdominal:     General: Bowel sounds are normal. There is no distension.     Palpations: Abdomen is soft.     Tenderness: There is no abdominal tenderness.  Musculoskeletal:        General: No swelling or deformity.     Cervical back: Normal range of motion.  Skin:    General: Skin is warm and dry.  Neurological:     Mental Status: She is alert.     Comments: Alert and oriented x 3. Normal insight and awareness.  Intact Memory. Normal language and minimal dysarthria. Cranial nerve exam unremarkable. Motor 4+/5 L slightly greater than right. Sl decrease in [facility] R>L UE and LE to lesser extent. Was feeding self using right hand however. Sensory exam normal for light touch and pain in all 4 limbs. No limb ataxia or cerebellar signs. No abnormal tone appreciated.       No results found  for this or any previous visit (from the past 48 hour(s)). No results found.    Blood pressure 121/60, pulse 81, temperature 98.6 F (37 C), temperature source Oral, resp. rate 20, height 5\' 2"  (1.575 m), weight 86.2 kg, SpO2 97 %.  Medical Problem List and Plan: 1. 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 2.  Antithrombotics: -DVT/anticoagulation:  Pharmaceutical: Lovenox  -antiplatelet therapy: Aspirin 81 mg and Plavix 75 mg for 3 weeks followed by aspirin 81 mg  3. Pain Management: Tylenol as needed  -headaches are very mild and appear to be only tension-type headaches in left temple area. 4. Mood/Behavior/Sleep: LCSW to evaluate and provide emotional support  -antipsychotic agents: n/a  5. Neuropsych/cognition: This patient is capable of making decisions on her own behalf.  6. Skin/Wound Care: Routine skin care checks  7. Fluids/Electrolytes/Nutrition: Routine I's and O's and follow-up chemistries  -Heart healthy diet  8: Hyperlipidemia: Continue Crestor 40 mg  9: Sleep apnea:  -continue CPAP mask at nighttime (if tolerates)  -screened for Sleep Smart study  10: Nausea: started on Protonix daily  11: Obesity: BMI = 34.75; weight loss counseling  12. Pt reports loose, more urgent stool  -likely d/t change in diet  -hold stool softeners  -add probiotic  Barbie Banner, PA-C 11/13/2022

## 2022-11-13 NOTE — Progress Notes (Deleted)
  Swartz, Zachary T, MD  Physician Physical Medicine and Rehabilitation   PMR Pre-admission     Signed   Date of Service: 11/10/2022  3:37 PM   Signed      Show:Clear all [x]Written[x]Templated[]Copied  Added by: [x]Kalika Smay G, RN[x]Graves Madden, Lauren P, CCC-SLP[x]Swartz, Zachary T, MD  []Hover for details PMR Admission Coordinator Pre-Admission Assessment   Patient: Joy Shelton is an 61 y.o., female MRN: [id number] DOB: 02/13/1961 Height: 5' 2" (157.5 cm) Weight: 86.2 kg   Insurance Information HMO:     PPO:      PCP:      IPA:      80/20:      OTHER:  PRIMARY: [employer]      Policy#: [id number]      Subscriber: patient CM Name: Tish      Phone#: [phone]     Fax#: [phone] Pre-Cert#: [id number]  approved for 6 days f/u with Jane phone [phone] fax [phone]    Employer:  Benefits:  Phone #: [phone]     Name: 1/3 Eff. Date: 11/09/22     Deduct: $3200      Out of Pocket Max: $6650      Life Max: none CIR: 90%      SNF: 90% 120 day limit per year Outpatient: 90%      Co-Pay: 20 visits combined Home Health: 90%      Co-Pay: 60 visits combined DME: 90%     Co-Pay: 10% Providers: in-network  SECONDARY:  none    Financial Counselor:       Phone#:    The "Data Collection Information Summary" for patients in Inpatient Rehabilitation Facilities with attached "Privacy Act Statement-Health Care Records" was provided and verbally reviewed with: N/A   Emergency Contact Information Contact Information       Name Relation Home Work Mobile    Shackleton,Robin Sister [phone]   [phone]         Current Medical History  Patient Admitting Diagnosis: CVA   History of Present Illness: Pt is a 61 year old female with medical hx significant for: HLD. Pt presented to [facility] ED on 11/08/22 due to complaints of headache and arm numbness. CT head showed no acute abnormalities. Pt transferred to [facility] for further workup. Workup  completed and MRI negative for acute abnormalities so pt discharged home.   Patient returned to [facility] on 11/08/22 due to right-sided weakness and difficulty talking. MRI revealed acute lacunar infarcts in central pons, left greater than right. Felt likely secondary to small vessel disease.  CTA showed no LVO. 2 D echo 60 to 65%, no atrial level shunt detected. LDL 139. Rosuvastatin at home changed to atorvastatin.  Lovenox for DVT prophylaxis. No antithrombotic prior to admit, now on Asa and clopidogrel daily for 3 weeks to be followed by ASA for monotherapy. Enrolled in the sleep smart stroke prevention study.   Complete NIHSS TOTAL: 0   Patient's medical record from [facility] has been reviewed by the rehabilitation admission coordinator and physician.   Past Medical History      Past Medical History:  Diagnosis Date   Hypercholesteremia      Has the patient had major surgery during 100 days prior to admission? No   Family History   family history is not on file.   Current Medications   Current Facility-Administered Medications:    aspirin EC tablet 81 mg, 81 mg, Oral, Daily, Smith, Rondell   A, MD, 81 mg at 11/13/22 1025   clopidogrel (PLAVIX) tablet 75 mg, 75 mg, Oral, Daily, Smith, Rondell A, MD, 75 mg at 11/13/22 1025   enoxaparin (LOVENOX) injection 40 mg, 40 mg, Subcutaneous, Q24H, Adhikari, Amrit, MD, 40 mg at 11/12/22 1503   loperamide (IMODIUM) capsule 2 mg, 2 mg, Oral, Q6H PRN, Adhikari, Amrit, MD, 2 mg at 11/11/22 1652   pantoprazole (PROTONIX) EC tablet 40 mg, 40 mg, Oral, Q0600, Rai, Ripudeep K, MD, 40 mg at 11/13/22 1025   rosuvastatin (CRESTOR) tablet 40 mg, 40 mg, Oral, Daily, Rai, Ripudeep K, MD, 40 mg at 11/13/22 1025   Patients Current Diet:  Diet Order                  Diet Heart Room service appropriate? Yes; Fluid consistency: Thin  Diet effective now                       Precautions / Restrictions Precautions Precautions:  Fall Restrictions Weight Bearing Restrictions: No    Has the patient had 2 or more falls or a fall with injury in the past year? No   Prior Activity Level Community (5-7x/wk): works, drives, gets out of house ~5-6 days/week   Prior Functional Level Self Care: Did the patient need help bathing, dressing, using the toilet or eating? Independent   Indoor Mobility: Did the patient need assistance with walking from room to room (with or without device)? Independent   Stairs: Did the patient need assistance with internal or external stairs (with or without device)? Independent   Functional Cognition: Did the patient need help planning regular tasks such as shopping or remembering to take medications? Independent   Patient Information Are you of Hispanic, Latino/a,or Spanish origin?: A. No, not of Hispanic, Latino/a, or Spanish origin What is your race?: A. White Do you need or want an interpreter to communicate with a doctor or health care staff?: 0. No   Patient's Response To:  Health Literacy and Transportation Is the patient able to respond to health literacy and transportation needs?: Yes Health Literacy - How often do you need to have someone help you when you read instructions, pamphlets, or other written material from your doctor or pharmacy?: Never In the past 12 months, has lack of transportation kept you from medical appointments or from getting medications?: No In the past 12 months, has lack of transportation kept you from meetings, work, or from getting things needed for daily living?: No   Home Assistive Devices / Equipment Home Assistive Devices/Equipment: Cane (specify quad or straight), Wheelchair, Walker (specify type) Home Equipment: Rolling Walker (2 wheels), Cane - single point (belong to her father)   Prior Device Use: Indicate devices/aids used by the patient prior to current illness, exacerbation or injury? None of the above   Current Functional  Level Cognition   Arousal/Alertness: Awake/alert Overall Cognitive Status: Within Functional Limits for tasks assessed Orientation Level: Oriented X4 Attention: Sustained, Selective Sustained Attention: Appears intact Selective Attention: Appears intact Memory: Appears intact Awareness: Appears intact Problem Solving: Appears intact Comments: stated able to use cell phone (in lieu of office phone) when dad brings charger for her    Extremity Assessment (includes Sensation/Coordination)   Upper Extremity Assessment: RUE deficits/detail RUE Deficits / Details: AROM and strength WFL; decreased fine motor, in-hand manipulation skills, making hand "clumsy" RUE Coordination: decreased fine motor  Lower Extremity Assessment: Defer to PT evaluation RLE Deficits / Details: knee extension 4/5   RLE Sensation: WNL RLE Coordination: WNL     ADLs   Overall ADL's : Needs assistance/impaired Eating/Feeding: Set up, Supervision/ safety, Sitting Grooming: Set up, Supervision/safety, Sitting Upper Body Bathing: Set up, Supervision/ safety, Sitting Lower Body Bathing: Minimal assistance Upper Body Dressing : Set up, Supervision/safety, Sitting Lower Body Dressing: Minimal assistance, Sit to/from stand Toilet Transfer: Minimal assistance, Ambulation Toileting- Clothing Manipulation and Hygiene: Minimal assistance Functional mobility during ADLs: Minimal assistance General ADL Comments: very rigid posture adn shuffling gait; unable to bend forward to retrienve any object at knee level; complains of dizziness with bending forward     Mobility   Overal bed mobility: Needs Assistance Bed Mobility: Supine to Sit, Sit to Supine Supine to sit: Supervision Sit to supine: Supervision General bed mobility comments: incr time and effort, HOB flat no rail     Transfers   Overall transfer level: Needs assistance Equipment used: Rolling walker (2 wheels) Transfers: Sit to/from Stand Sit to Stand: Min  guard General transfer comment: requires verbal cues for safe hand placement to prevent pt from pulling up from RW.     Ambulation / Gait / Stairs / Wheelchair Mobility   Ambulation/Gait Ambulation/Gait assistance: Min assist Gait Distance (Feet): 150 Feet Assistive device: Rolling walker (2 wheels) Gait Pattern/deviations: Step-through pattern, Decreased stride length, Drifts right/left General Gait Details: very poor foot clearance with shuffling gait initially; improved with vc to improve foot clearance (emphasizing heelstrike); very hesitant and slow. Still drifting to her left, but pt able to maneuver RW without assist. Gait velocity interpretation: <1.8 ft/sec, indicate of risk for recurrent falls     Posture / Balance Balance Overall balance assessment: Needs assistance Sitting-balance support: No upper extremity supported, Feet supported Sitting balance-Leahy Scale: Good Standing balance support: Bilateral upper extremity supported, No upper extremity supported, Reliant on assistive device for balance Standing balance-Leahy Scale: Poor Standing balance comment: requires/seeks UE support High level balance activites: Other (comment) High Level Balance Comments: performed rhomberg standing (EO, EC), semi tandem standing and SLS. Pt with incr sway with rhomberg (especially with EC) but held x 10 seconds. Semi-tandem with each leg forward with slight imbalance that pt recovered x 10 sec. SLS on each leg with min assist to maintain x 10 seconds Standardized Balance Assessment Standardized Balance Assessment : Berg Balance Test Berg Balance Test Sit to Stand: Needs minimal aid to stand or to stabilize Standing Unsupported: Able to stand 2 minutes with supervision Sitting with Back Unsupported but Feet Supported on Floor or Stool: Able to sit safely and securely 2 minutes Stand to Sit: Sits safely with minimal use of hands Transfers: Needs one person to assist Standing Unsupported with  Eyes Closed: Able to stand 10 seconds with supervision Standing Ubsupported with Feet Together: Able to place feet together independently and stand for 1 minute with supervision From Standing, Reach Forward with Outstretched Arm: Reaches forward but needs supervision From Standing Position, Pick up Object from Floor: Unable to pick up and needs supervision From Standing Position, Turn to Look Behind Over each Shoulder: Needs supervision when turning Turn 360 Degrees: Needs assistance while turning Standing Unsupported, Alternately Place Feet on Step/Stool: Needs assistance to keep from falling or unable to try Standing Unsupported, One Foot in Front: Loses balance while stepping or standing Standing on One Leg: Unable to try or needs assist to prevent fall Total Score: 22     Special needs/care consideration Enrolled in sleep smart stroke prevention study    Previous Home Environment    Living Arrangements: Alone  Lives With:  (alone) Available Help at Discharge: Family, Available 24 hours/day Type of Home: House Home Layout: One level Home Access: Stairs to enter Entrance Stairs-Rails: None Entrance Stairs-Number of Steps: 1 Bathroom Shower/Tub: Chiropodist: Handicapped height Bathroom Accessibility: Yes How Accessible: Accessible via walker Dukes: No Additional Comments: above information for her home; may go stay with father as well as has sister, Shirlean Mylar to assist   Discharge Living Setting Plans for Discharge Living Setting: Patient's home Type of Home at Discharge: House Discharge Home Layout: One level Discharge Home Access: Stairs to enter Entrance Stairs-Rails: None Entrance Stairs-Number of Steps: 1 Discharge Bathroom Shower/Tub: Tub/shower unit Discharge Bathroom Toilet: Handicapped height Discharge Bathroom Accessibility: Yes How Accessible: Accessible via walker Does the patient have any problems obtaining your medications?: No    Social/Family/Support Systems Anticipated Caregiver: Robon Holcomb, sister and/or pt's father Anticipated Ambulance person Information: Shirlean Mylar: [phone]   Goals Patient/Family Goal for Rehab: Supervision-Mod I:PT/OT Expected length of stay: 5-7 days   Decrease burden of Care through IP rehab admission: NA   Possible need for SNF placement upon discharge: Not anticipated   Patient Condition: I have reviewed medical records from [facility], spoken with CM, and patient. I met with patient at the bedside for inpatient rehabilitation assessment.  Patient will benefit from ongoing PT and OT, can actively participate in 3 hours of therapy a day 5 days of the week, and can make measurable gains during the admission.  Patient will also benefit from the coordinated team approach during an Inpatient Acute Rehabilitation admission.  The patient will receive intensive therapy as well as Rehabilitation physician, nursing, social worker, and care management interventions.  Due to bladder management, safety, disease management, medication administration, and patient education the patient requires 24 hour a day rehabilitation nursing.  The patient is currently min assist with mobility and basic ADLs.  Discharge setting and therapy post discharge at home with home health is anticipated.  Patient has agreed to participate in the Acute Inpatient Rehabilitation Program and will admit today.   Preadmission Screen Completed By: Gayland Curry with updates by Danne Baxter RN MSN, 11/13/2022 1:33 PM ______________________________________________________________________   Discussed status with Dr. Naaman Plummer on 11/13/22 at 1330 and received approval for admission today.   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 Yes Co-Morbidities requiring supervision/potential complications: mild headaches, post-stroke sequelae Due to bladder management, bowel management, safety, skin/wound care, disease management, medication administration, pain management, and patient education, does the patient require 24 hr/day rehab nursing? Yes Does the patient require coordinated care of a physician, rehab nurse, PT, OT to address physical and functional deficits in the context of the above medical diagnosis(es)? Yes Addressing deficits in the following areas: balance, endurance, locomotion, strength, transferring, bowel/bladder control, bathing, dressing, feeding, grooming, toileting, and psychosocial support Can the patient actively participate in an intensive therapy program of at least 3 hrs of therapy 5 days a week? Yes The potential for patient to make measurable gains while on inpatient rehab is excellent Anticipated functional outcomes upon discharge from inpatient rehab: modified independent PT, modified independent OT, n/a SLP Estimated rehab length of stay to reach the above  functional goals is: 5-7 days Anticipated discharge destination: Home 10. Overall Rehab/Functional Prognosis: excellent     MD Signature: Meredith Staggers, MD, [place] Director Rehabilitation Services 11/13/2022          Revision History

## 2022-11-13 NOTE — Progress Notes (Signed)
[facility]                                                                              Joy Shelton, is a 62 y.o. female, DOB - 09/22/1961, QVZ:[id number] Admit date - 11/08/2022    Outpatient Primary MD for the patient is Harlan Stains, MD  LOS - 4  days  Chief Complaint  Patient presents with   Numbness        Brief summary   Patient is a 62 year old female with history of hyperlipidemia who presented here with right-sided weakness, slurred speech.  She was earlier seen in the ED with complaints of numbness/tingling on bilateral hands but at that time MRI was negative and she was discharged home.  Came back to the ED with weakness on the right side.  Speech was slurred.    MRI showed acute lacunar infarct in the central pons left greater than right.  PT/OT recommending acute inpatient rehab.    Stroke workup completed, medically stable for discharge to CIR when bed available    Assessment & Plan    Principal Problem: Acute ischemic CVA (cerebral vascular accident) ([place]) -Initially presented with numbness of bilateral upper extremities to the ED on 11/08/22 however MRI was negative for acute intracranial abnormality -Patient returned back with persisting symptoms, repeat MRI on 11/09/2022 showed acute lacunar infarct in the central pons left> right, no associated hemorrhage or mass effect -CTA head and neck showed no emergent large vessel occlusions -2D echo: EF of 60 to 32%, normal diastolic parameters, no intracardiac source of emboli -Hb A1c 6.1, LDL 139, placed on statin - PT recommended inpatient rehab -Neurology recommended aspirin 81 mg daily, Plavix 75 mg daily for 3 weeks then continue aspirin 81 mg p.o. monotherapy -Medically cleared to be discharged to CIR, once bed available, currently awaiting insurance authorization  Active Problems:   Hyperlipidemia -LDL 139, patient was placed on Lipitor however she reported prior intolerance to Lipitor and has  tolerated Crestor.  Requesting change to Crestor -Changed to Crestor 40 mg daily (was on 10 mg daily at home)  GERD -Complaining of nausea in the mornings, placed on Protonix 40 mg daily     Obesity (BMI 30-39.9) Estimated body mass index is 34.75 kg/m as calculated from the following:   Height as of this encounter: 5\' 2"  (1.575 m).   Weight as of this encounter: 86.2 kg.  Code Status: Full code DVT Prophylaxis:  enoxaparin (LOVENOX) injection 40 mg Start: 11/10/22 1400   Level of Care: Level of care: Telemetry Medical Family Communication: Updated patient  Disposition Plan:      Remains inpatient appropriate: Awaiting CIR bed, medically ready  Procedures:  MRI brain, CTA head and neck, 2D echo  Consultants:   Neurology, CIR  Antimicrobials: None   Medications  aspirin EC  81 mg Oral Daily   clopidogrel  75 mg Oral Daily   enoxaparin (LOVENOX) injection  40 mg Subcutaneous Q24H   pantoprazole  40 mg Oral Q0600   rosuvastatin  40 mg Oral Daily      Subjective:   Joy Shelton was seen and examined today.  Weakness on the right improving.  No acute chest pain, shortness of breath, dizziness.  Reports nausea in the mornings otherwise improving.    Objective:   Vitals:   11/12/22 2328 11/13/22 0311 11/13/22 0826 11/13/22 1212  BP: 132/71 (!) 140/70 137/72 121/60  Pulse: 76 76 78 81  Resp: 17 17 19 20   Temp: 98.4 F (36.9 C) 97.8 F (36.6 C) 98.2 F (36.8 C) 98.6 F (37 C)  TempSrc: Oral Oral Oral Oral  SpO2: 98% 98% 96% 97%  Weight:      Height:        Intake/Output Summary (Last 24 hours) at 11/13/2022 1317 Last data filed at 11/13/2022 1000 Gross per 24 hour  Intake 117 ml  Output --  Net 117 ml     Wt Readings from Last 3 Encounters:  11/09/22 86.2 kg  11/08/22 86.2 kg  04/05/21 85.6 kg   Physical Exam General: Alert and oriented x 3, NAD Cardiovascular: S1 S2 clear, RRR.  Respiratory: CTAB, no wheezing, rales or rhonchi Gastrointestinal: Soft,  nontender, nondistended, NBS Ext: no pedal edema bilaterally Neuro: no new deficits   Data Reviewed:  I have personally reviewed following labs    CBC Lab Results  Component Value Date   WBC 9.9 11/08/2022   RBC 5.33 (H) 11/08/2022   HGB 13.6 11/09/2022   HCT 40.0 11/09/2022   MCV 85.0 11/08/2022   MCH 28.1 11/08/2022   PLT 373 11/08/2022   MCHC 33.1 11/08/2022   RDW 13.3 11/08/2022   LYMPHSABS 2.4 11/08/2022   MONOABS 0.7 11/08/2022   EOSABS 0.2 11/08/2022   BASOSABS 0.0 13/06/6577     Last metabolic panel Lab Results  Component Value Date   NA 139 11/11/2022   K 3.6 11/11/2022   CL 106 11/11/2022   CO2 23 11/11/2022   BUN 15 11/11/2022   CREATININE 0.79 11/11/2022   GLUCOSE 93 11/11/2022   GFRNONAA >60 11/11/2022   CALCIUM 8.8 (L) 11/11/2022   ANIONGAP 10 11/11/2022    CBG (last 3)  No results for input(s): "GLUCAP" in the last 72 hours.    Coagulation Profile: Recent Labs  Lab 11/08/22 0210  INR 0.9     Radiology Studies: I have personally reviewed the imaging studies  No results found.     Estill Cotta M.D. [facility] 11/13/2022, 1:17 PM  Available via Epic secure chat 7am-7pm After 7 pm, please refer to night coverage provider listed on amion.

## 2022-11-13 NOTE — H&P (Signed)
Physical Medicine and Rehabilitation Admission H&P     CC: Functional deficits there is secondary to acute lacunar infarctions   HPI: Joy Shelton is a 62 year old female who initially presented to the ED with fluctuating paraesthesia and transient speech disturbance on 11/04/2022.  MRI was negative on 12/31 for acute infarction and she was discharged home.  Due to ongoing speech disturbance and generalized weakness, she returned back to the ED for further evaluation on 11/08/2022.  A repeat MRI of the brain was positive for acute lacunar infarcts in the central pons left greater than right.  There was no associated hemorrhage or mass effect.  CTA of the head and neck was also performed without emergent large vessel occlusions.  She was started on DAPT.  Lovenox started for DVT prophylaxis. She complained of morning nausea and was started on Protonix. Discussed switching to atorvastatin but she stated she was intolerant. Her home dose of rosuvastatin was increased to 40 mg daily. She is tolerating heart healthy diet. The patient requires inpatient physical medicine and rehabilitation evaluations and treatment secondary to dysfunction due to lacunar infarcts.   She did participate in the Sleep Smart clinical trial and had overnight NOx 3 monitor for sleep apnea and tested positive and will have CPAP mask tolerability test. 2D echo: EF of 60 to 65%, normal diastolic parameters, no intracardiac source of emboli. Hb A1c 6.1, LDL 139,.   Review of Systems  Constitutional:  Negative for chills and fever.  HENT:  Negative for hearing loss and tinnitus.   Eyes:  Negative for blurred vision, double vision and pain.  Respiratory:  Negative for cough and hemoptysis.   Cardiovascular:  Negative for chest pain and palpitations.  Gastrointestinal:  Positive for diarrhea. Negative for nausea and vomiting.  Genitourinary:  Negative for dysuria and urgency.  Musculoskeletal:  Negative for myalgias and neck pain.   Skin:  Negative for rash.  Neurological:  Positive for headaches. Negative for sensory change and speech change.  Psychiatric/Behavioral:  Negative for depression and suicidal ideas.         Past Medical History:  Diagnosis Date   Hypercholesteremia      History reviewed. No pertinent surgical history. History reviewed. No pertinent family history. Social History:  reports that she has never smoked. She has never used smokeless tobacco. She reports that she does not currently use alcohol. She reports that she does not use drugs. Allergies: No Known Allergies       Medications Prior to Admission  Medication Sig Dispense Refill   acetaminophen (TYLENOL) 500 MG tablet Take 1,000 mg by mouth every 6 (six) hours as needed for moderate pain.       rosuvastatin (CRESTOR) 10 MG tablet Take 10 mg by mouth daily.       VITAMIN D PO Take 1 tablet by mouth daily.              Home: Home Living Family/patient expects to be discharged to:: Private residence Living Arrangements: Alone Available Help at Discharge: Family, Available 24 hours/day Type of Home: House Home Access: Stairs to enter [employer] of Steps: 1 Entrance Stairs-Rails: None Home Layout: One level Bathroom Shower/Tub: Engineer, manufacturing systems: Handicapped height Bathroom Accessibility: Yes Home Equipment: Agricultural consultant (2 wheels), [employer] - single point (belong to her father) Additional Comments: above information for her home; may go stay with father  Lives With:  (alone)   Functional History: Prior Function Prior Level of Function : Independent/Modified Independent  ADLs Comments: drives; works as an Research officer, trade union Status:  Mobility: Bed Mobility Overal bed mobility: Needs Assistance Bed Mobility: Supine to Sit, Sit to Supine Supine to sit: Supervision Sit to supine: Supervision General bed mobility comments: incr time and effort, HOB flat no rail Transfers Overall transfer level:  Needs assistance Equipment used: Rolling walker (2 wheels) Transfers: Sit to/from Stand Sit to Stand: Min guard General transfer comment: requires verbal cues for safe hand placement to prevent pt from pulling up from RW. Ambulation/Gait Ambulation/Gait assistance: Min assist Gait Distance (Feet): 150 Feet Assistive device: Rolling walker (2 wheels) Gait Pattern/deviations: Step-through pattern, Decreased stride length, Drifts right/left General Gait Details: very poor foot clearance with shuffling gait initially; improved with vc to improve foot clearance (emphasizing heelstrike); very hesitant and slow. Still drifting to her left, but pt able to maneuver RW without assist. Gait velocity interpretation: <1.8 ft/sec, indicate of risk for recurrent falls   ADL: ADL Overall ADL's : Needs assistance/impaired Eating/Feeding: Set up, Supervision/ safety, Sitting Grooming: Set up, Supervision/safety, Sitting Upper Body Bathing: Set up, Supervision/ safety, Sitting Lower Body Bathing: Minimal assistance Upper Body Dressing : Set up, Supervision/safety, Sitting Lower Body Dressing: Minimal assistance, Sit to/from stand Toilet Transfer: Minimal assistance, Ambulation Toileting- Clothing Manipulation and Hygiene: Minimal assistance Functional mobility during ADLs: Minimal assistance General ADL Comments: very rigid posture adn shuffling gait; unable to bend forward to retrienve any object at knee level; complains of dizziness with bending forward   Cognition: Cognition Overall Cognitive Status: Within Functional Limits for tasks assessed Arousal/Alertness: Awake/alert Orientation Level: Oriented X4 Year: 2024 Month: January Day of Week: Correct Attention: Sustained, Selective Sustained Attention: Appears intact Selective Attention: Appears intact Memory: Appears intact Awareness: Appears intact Problem Solving: Appears intact Comments: stated able to use cell phone (in lieu of office  phone) when dad brings charger for her Cognition Arousal/Alertness: Awake/alert Behavior During Therapy: WFL for tasks assessed/performed Overall Cognitive Status: Within Functional Limits for tasks assessed   Physical Exam: Blood pressure 121/60, pulse 81, temperature 98.6 F (37 C), temperature source Oral, resp. rate 20, height 5\' 2"  (1.575 m), weight 86.2 kg, SpO2 97 %. Physical Exam Constitutional:      General: She is not in acute distress.    Appearance: She is obese. She is not ill-appearing.  HENT:     Head: Normocephalic and atraumatic.     Right Ear: External ear normal.     Left Ear: External ear normal.     Nose: Nose normal.     Mouth/Throat:     Mouth: Mucous membranes are moist.  Eyes:     General: No scleral icterus.    Extraocular Movements: Extraocular movements intact.     Conjunctiva/sclera: Conjunctivae normal.     Pupils: Pupils are equal, round, and reactive to light.  Cardiovascular:     Rate and Rhythm: Normal rate and regular rhythm.     Heart sounds: No murmur heard.    No gallop.  Pulmonary:     Effort: Pulmonary effort is normal. No respiratory distress.     Breath sounds: No wheezing.  Abdominal:     General: Bowel sounds are normal. There is no distension.     Palpations: Abdomen is soft.     Tenderness: There is no abdominal tenderness.  Musculoskeletal:        General: No swelling or deformity.     Cervical back: Normal range of motion.  Skin:    General: Skin is warm  and dry.  Neurological:     Mental Status: She is alert.     Comments: Alert and oriented x 3. Normal insight and awareness. Intact Memory. Normal language and minimal dysarthria. Cranial nerve exam unremarkable. Motor 4+/5 L slightly greater than right. Sl decrease in [facility] R>L UE and LE to lesser extent. Was feeding self using right hand however. Sensory exam normal for light touch and pain in all 4 limbs. No limb ataxia or cerebellar signs. No abnormal tone appreciated.           Lab Results Last 48 Hours  No results found for this or any previous visit (from the past 48 hour(s)).   Imaging Results (Last 48 hours)  No results found.         Blood pressure 121/60, pulse 81, temperature 98.6 F (37 C), temperature source Oral, resp. rate 20, height 5\' 2"  (1.575 m), weight 86.2 kg, SpO2 97 %.   Medical Problem List and Plan: 1. 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 2.  Antithrombotics: -DVT/anticoagulation:  Pharmaceutical: Lovenox             -antiplatelet therapy: Aspirin 81 mg and Plavix 75 mg for 3 weeks followed by aspirin 81 mg   3. Pain Management: Tylenol as needed             -headaches are very mild and appear to be only tension-type headaches in left temple area. 4. Mood/Behavior/Sleep: LCSW to evaluate and provide emotional support             -antipsychotic agents: n/a   5. Neuropsych/cognition: This patient is capable of making decisions on her own behalf.   6. Skin/Wound Care: Routine skin care checks   7. Fluids/Electrolytes/Nutrition: Routine I's and O's and follow-up chemistries             -Heart healthy diet   8: Hyperlipidemia: Continue Crestor 40 mg   9: Sleep apnea:  -continue CPAP mask at nighttime (if tolerates)             -screened for Sleep Smart study   10: Nausea: started on Protonix daily   11: Obesity: BMI = 34.75; weight loss counseling  12. Pt reports loose, more urgent stool             -likely d/t change in diet             -hold stool softeners             -add probiotic   Barbie Banner, PA-C 11/13/2022 I have personally performed a face to face diagnostic evaluation of this patient and formulated the key components of the plan.  Additionally, I have personally reviewed laboratory data, imaging  studies, as well as relevant notes and concur with the physician assistant's documentation above.  The patient's status has not changed from the original H&P.  Any changes in documentation from the acute care chart have been noted above.  Meredith Staggers, MD, [facility]

## 2022-11-13 NOTE — Progress Notes (Signed)
Inpatient Rehabilitation Admissions Coordinator   I contacted [facility] to accelerate authorization for a possible CIR admit. I await their determination.  Danne Baxter, RN, MSN Rehab Admissions Coordinator [phone] 11/13/2022 9:52 AM

## 2022-11-13 NOTE — Progress Notes (Signed)
Meredith Staggers, MD  Physician Physical Medicine and Rehabilitation   PMR Pre-admission     Signed   Date of Service: 11/10/2022  3:37 PM   Signed      Show:Clear all [x] Written[x] Templated[] Copied  Added by: [x] Cristina Gong, RN[x] Lind Covert, Lauren Mamie Nick, CCC-SLP[x] Meredith Staggers, MD  [] Hover for details PMR Admission Coordinator Pre-Admission Assessment   Patient: Joy Shelton is an 62 y.o., female MRN: [id number] DOB: 1961-04-10 Height: 5\' 2"  (157.5 cm) Weight: 86.2 kg   Insurance Information HMO:     PPO:      PCP:      IPA:      80/20:      OTHER:  PRIMARY: UHC      Policy#: [id number]      Subscriber: patient CM Name: Tish      Phone#: [phone]     Fax#: [phone] Pre-Cert#: [id number]  approved for 6 days f/u with Opal Sidles phone [phone] fax [phone]    Employer:  Benefits:  Phone #: [phone]     Name: 1/3 Eff. Date: 11/09/22     Deduct: $3200      Out of Pocket Max: [phone]      Life Max: none CIR: 90%      SNF: 90% 120 day limit per year Outpatient: 90%      Co-Pay: 20 visits combined Home Health: 90%      Co-Pay: 60 visits combined DME: 90%     Co-Pay: 10% Providers: in-network  SECONDARY:  none    Financial Counselor:       Phone#:    The Engineer, petroleum" for patients in Inpatient Rehabilitation Facilities with attached "Privacy Act [employer] Records" was provided and verbally reviewed with: N/A   Emergency Contact Information Contact Information       Name Relation Home Work Mobile    [place] Sister [phone]   [phone]         Current Medical History  Patient Admitting Diagnosis: CVA   History of Present Illness: Pt is a 62 year old female with medical hx significant for: HLD. Pt presented to [facility] ED on 11/08/22 due to complaints of headache and arm numbness. CT head showed no acute abnormalities. Pt transferred to [facility] for further workup. Workup  completed and MRI negative for acute abnormalities so pt discharged home.   Patient returned to [facility] ED on 11/08/22 due to right-sided weakness and difficulty talking. MRI revealed acute lacunar infarcts in central pons, left greater than right. Felt likely secondary to small vessel disease.  CTA showed no LVO. 2 D echo 60 to 65%, no atrial level shunt detected. LDL 139. Rosuvastatin at home changed to atorvastatin.  Lovenox for DVT prophylaxis. No antithrombotic prior to admit, now on Asa and clopidogrel daily for 3 weeks to be followed by ASA for monotherapy. Enrolled in the sleep smart stroke prevention study.   Complete NIHSS TOTAL: 0   Patient's medical record from [facility] has been reviewed by the rehabilitation admission coordinator and physician.   Past Medical History      Past Medical History:  Diagnosis Date   Hypercholesteremia      Has the patient had major surgery during 100 days prior to admission? No   Family History   family history is not on file.   Current Medications   Current Facility-Administered Medications:    aspirin EC tablet 81 mg, 81 mg, Oral, Daily, Smith, Rondell  A, MD, 81 mg at 11/13/22 1025   clopidogrel (PLAVIX) tablet 75 mg, 75 mg, Oral, Daily, Smith, Rondell A, MD, 75 mg at 11/13/22 1025   enoxaparin (LOVENOX) injection 40 mg, 40 mg, Subcutaneous, Q24H, Adhikari, Amrit, MD, 40 mg at 11/12/22 1503   loperamide (IMODIUM) capsule 2 mg, 2 mg, Oral, Q6H PRN, Tawanna Solo, Amrit, MD, 2 mg at 11/11/22 1652   pantoprazole (PROTONIX) EC tablet 40 mg, 40 mg, Oral, Q0600, Rai, Ripudeep K, MD, 40 mg at 11/13/22 1025   rosuvastatin (CRESTOR) tablet 40 mg, 40 mg, Oral, Daily, Rai, Ripudeep K, MD, 40 mg at 11/13/22 1025   Patients Current Diet:  Diet Order                  Diet Heart Room service appropriate? Yes; Fluid consistency: Thin  Diet effective now                       Precautions / Restrictions Precautions Precautions:  Fall Restrictions Weight Bearing Restrictions: No    Has the patient had 2 or more falls or a fall with injury in the past year? No   Prior Activity Level Community (5-7x/wk): works, drives, gets out of house ~5-6 days/week   Prior Functional Level Self Care: Did the patient need help bathing, dressing, using the toilet or eating? Independent   Indoor Mobility: Did the patient need assistance with walking from room to room (with or without device)? Independent   Stairs: Did the patient need assistance with internal or external stairs (with or without device)? Independent   Functional Cognition: Did the patient need help planning regular tasks such as shopping or remembering to take medications? Independent   Patient Information Are you of Hispanic, Latino/a,or Spanish origin?: A. No, not of Hispanic, Latino/a, or Spanish origin What is your race?: A. White Do you need or want an interpreter to communicate with a doctor or health care staff?: 0. No   Patient's Response To:  Health Literacy and Transportation Is the patient able to respond to health literacy and transportation needs?: Yes Health Literacy - How often do you need to have someone help you when you read instructions, pamphlets, or other written material from your doctor or pharmacy?: Never In the past 12 months, has lack of transportation kept you from medical appointments or from getting medications?: No In the past 12 months, has lack of transportation kept you from meetings, work, or from getting things needed for daily living?: No   [place] / [place] Devices/Equipment: Radio producer (specify quad or straight), Wheelchair, Environmental consultant (specify type) Home Equipment: Conservation officer, nature (2 wheels), [employer] - single point (belong to her father)   Prior Device Use: Indicate devices/aids used by the patient prior to current illness, exacerbation or injury? None of the above   Current Functional  Level Cognition   Arousal/Alertness: Awake/alert Overall Cognitive Status: Within Functional Limits for tasks assessed Orientation Level: Oriented X4 Attention: Sustained, Selective Sustained Attention: Appears intact Selective Attention: Appears intact Memory: Appears intact Awareness: Appears intact Problem Solving: Appears intact Comments: stated able to use cell phone (in lieu of office phone) when dad brings charger for her    Extremity Assessment (includes Sensation/Coordination)   Upper Extremity Assessment: RUE deficits/detail RUE Deficits / Details: AROM and strength WFL; decreased fine motor, in-hand manipulation skills, making hand "clumsy" RUE Coordination: decreased fine motor  Lower Extremity Assessment: Defer to PT evaluation RLE Deficits / Details: knee extension 4/5  RLE Sensation: WNL RLE Coordination: WNL     ADLs   Overall ADL's : Needs assistance/impaired Eating/Feeding: Set up, Supervision/ safety, Sitting Grooming: Set up, Supervision/safety, Sitting Upper Body Bathing: Set up, Supervision/ safety, Sitting Lower Body Bathing: Minimal assistance Upper Body Dressing : Set up, Supervision/safety, Sitting Lower Body Dressing: Minimal assistance, Sit to/from stand Toilet Transfer: Minimal assistance, Ambulation Toileting- Clothing Manipulation and Hygiene: Minimal assistance Functional mobility during ADLs: Minimal assistance General ADL Comments: very rigid posture adn shuffling gait; unable to bend forward to retrienve any object at knee level; complains of dizziness with bending forward     Mobility   Overal bed mobility: Needs Assistance Bed Mobility: Supine to Sit, Sit to Supine Supine to sit: Supervision Sit to supine: Supervision General bed mobility comments: incr time and effort, HOB flat no rail     Transfers   Overall transfer level: Needs assistance Equipment used: Rolling walker (2 wheels) Transfers: Sit to/from Stand Sit to Stand: Min  guard General transfer comment: requires verbal cues for safe hand placement to prevent pt from pulling up from RW.     Ambulation / Gait / Stairs / Wheelchair Mobility   Ambulation/Gait Ambulation/Gait assistance: Herbalist (Feet): 150 Feet Assistive device: Rolling walker (2 wheels) Gait Pattern/deviations: Step-through pattern, Decreased stride length, Drifts right/left General Gait Details: very poor foot clearance with shuffling gait initially; improved with vc to improve foot clearance (emphasizing heelstrike); very hesitant and slow. Still drifting to her left, but pt able to maneuver RW without assist. Gait velocity interpretation: <1.8 ft/sec, indicate of risk for recurrent falls     Posture / Balance Balance Overall balance assessment: Needs assistance Sitting-balance support: No upper extremity supported, Feet supported Sitting balance-Leahy Scale: Good Standing balance support: Bilateral upper extremity supported, No upper extremity supported, Reliant on assistive device for balance Standing balance-Leahy Scale: Poor Standing balance comment: requires/seeks UE support High level balance activites: Other (comment) High Level Balance Comments: performed rhomberg standing (EO, EC), semi tandem standing and SLS. Pt with incr sway with rhomberg (especially with EC) but held x 10 seconds. Semi-tandem with each leg forward with slight imbalance that pt recovered x 10 sec. SLS on each leg with min assist to maintain x 10 seconds Standardized Balance Assessment Standardized Balance Assessment : Berg Balance Test Berg Balance Test Sit to Stand: Needs minimal aid to stand or to stabilize Standing Unsupported: Able to stand 2 minutes with supervision Sitting with Back Unsupported but Feet Supported on Floor or Stool: Able to sit safely and securely 2 minutes Stand to Sit: Sits safely with minimal use of hands Transfers: Needs one person to assist Standing Unsupported with  Eyes Closed: Able to stand 10 seconds with supervision Standing Ubsupported with Feet Together: Able to place feet together independently and stand for 1 minute with supervision From Standing, Reach Forward with Outstretched Arm: Reaches forward but needs supervision From Standing Position, Pick up Object from Floor: Unable to pick up and needs supervision From Standing Position, Turn to Look Behind Over each Shoulder: Needs supervision when turning Turn 360 Degrees: Needs assistance while turning Standing Unsupported, Alternately Place Feet on Step/Stool: Needs assistance to keep from falling or unable to try Standing Unsupported, One Foot in Front: Loses balance while stepping or standing Standing on One Leg: Unable to try or needs assist to prevent fall Total Score: 22     Special needs/care consideration Enrolled in sleep smart stroke prevention study    Previous Home Environment  Living Arrangements: Alone  Lives With:  (alone) Available Help at Discharge: Family, Available 24 hours/day Type of Home: House Home Layout: One level Home Access: Stairs to enter Entrance Stairs-Rails: None Entrance Stairs-Number of Steps: 1 Bathroom Shower/Tub: Chiropodist: Handicapped height Bathroom Accessibility: Yes How Accessible: Accessible via walker Dukes: No Additional Comments: above information for her home; may go stay with father as well as has sister, Shirlean Mylar to assist   Discharge Living Setting Plans for Discharge Living Setting: Patient's home Type of Home at Discharge: House Discharge Home Layout: One level Discharge Home Access: Stairs to enter Entrance Stairs-Rails: None Entrance Stairs-Number of Steps: 1 Discharge Bathroom Shower/Tub: Tub/shower unit Discharge Bathroom Toilet: Handicapped height Discharge Bathroom Accessibility: Yes How Accessible: Accessible via walker Does the patient have any problems obtaining your medications?: No    Social/Family/Support Systems Anticipated Caregiver: Robon Holcomb, sister and/or pt's father Anticipated Ambulance person Information: Shirlean Mylar: [phone]   Goals Patient/Family Goal for Rehab: Supervision-Mod I:PT/OT Expected length of stay: 5-7 days   Decrease burden of Care through IP rehab admission: NA   Possible need for SNF placement upon discharge: Not anticipated   Patient Condition: I have reviewed medical records from [facility], spoken with CM, and patient. I met with patient at the bedside for inpatient rehabilitation assessment.  Patient will benefit from ongoing PT and OT, can actively participate in 3 hours of therapy a day 5 days of the week, and can make measurable gains during the admission.  Patient will also benefit from the coordinated team approach during an Inpatient Acute Rehabilitation admission.  The patient will receive intensive therapy as well as Rehabilitation physician, nursing, social worker, and care management interventions.  Due to bladder management, safety, disease management, medication administration, and patient education the patient requires 24 hour a day rehabilitation nursing.  The patient is currently min assist with mobility and basic ADLs.  Discharge setting and therapy post discharge at home with home health is anticipated.  Patient has agreed to participate in the Acute Inpatient Rehabilitation Program and will admit today.   Preadmission Screen Completed By: Gayland Curry with updates by Danne Baxter RN MSN, 11/13/2022 1:33 PM ______________________________________________________________________   Discussed status with Dr. Naaman Plummer on 11/13/22 at 1330 and received approval for admission today.   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 Yes Co-Morbidities requiring supervision/potential complications: mild headaches, post-stroke sequelae Due to bladder management, bowel management, safety, skin/wound care, disease management, medication administration, pain management, and patient education, does the patient require 24 hr/day rehab nursing? Yes Does the patient require coordinated care of a physician, rehab nurse, PT, OT to address physical and functional deficits in the context of the above medical diagnosis(es)? Yes Addressing deficits in the following areas: balance, endurance, locomotion, strength, transferring, bowel/bladder control, bathing, dressing, feeding, grooming, toileting, and psychosocial support Can the patient actively participate in an intensive therapy program of at least 3 hrs of therapy 5 days a week? Yes The potential for patient to make measurable gains while on inpatient rehab is excellent Anticipated functional outcomes upon discharge from inpatient rehab: modified independent PT, modified independent OT, n/a SLP Estimated rehab length of stay to reach the above  functional goals is: 5-7 days Anticipated discharge destination: Home 10. Overall Rehab/Functional Prognosis: excellent     MD Signature: Meredith Staggers, MD, [place] Director Rehabilitation Services 11/13/2022          Revision History

## 2022-11-13 NOTE — Progress Notes (Signed)
STROKE TEAM PROGRESS NOTE   INTERVAL HISTORY Her sister is present at bedside.  Patient is sitting up in bed, in no acute distress. Neurologic exam stable, VSS    Patient was able to tolerate CPAP last night using a different mask and qualified for sleep smart study.  She was randomized to the CPAP treatment arm.  Insurance auths have been sent out for possible CIR admission.    Vitals:   11/12/22 2328 11/13/22 0311 11/13/22 0826 11/13/22 1212  BP: 132/71 (!) 140/70 137/72 121/60  Pulse: 76 76 78 81  Resp: 17 17 19 20   Temp: 98.4 F (36.9 C) 97.8 F (36.6 C) 98.2 F (36.8 C) 98.6 F (37 C)  TempSrc: Oral Oral Oral Oral  SpO2: 98% 98% 96% 97%  Weight:      Height:       CBC:  Recent Labs  Lab 11/08/22 0210 11/09/22 1314  WBC 9.9  --   NEUTROABS 6.5  --   HGB 15.0 13.6  HCT 45.3 40.0  MCV 85.0  --   PLT 373  --    Basic Metabolic Panel:  Recent Labs  Lab 11/08/22 0210 11/09/22 1314 11/11/22 0328  NA 141 142 139  K 3.9 3.7 3.6  CL 106 108 106  CO2 28  --  23  GLUCOSE 127* 105* 93  BUN 11 9 15   CREATININE 0.77 0.60 0.79  CALCIUM 9.1  --  8.8*   Lipid Panel:  Recent Labs  Lab 11/09/22 1255  CHOL 208*  TRIG 66  HDL 56  CHOLHDL 3.7  VLDL 13  LDLCALC 139*   HgbA1c:  Recent Labs  Lab 11/09/22 1255  HGBA1C 6.1*   Urine Drug Screen: No results for input(s): "LABOPIA", "COCAINSCRNUR", "LABBENZ", "AMPHETMU", "THCU", "LABBARB" in the last 168 hours.  Alcohol Level  Recent Labs  Lab 11/08/22 0258  ETH <10    IMAGING past 24 hours No results found.  PHYSICAL EXAM  Physical Exam  Constitutional: A mildly obese middle-aged Caucasian lady sitting up in bed, in no acute distress Psych: Affect appropriate to situation, calm and cooperative with exam  Eyes: No scleral injection HENT: No OP obstrucion MSK: no joint deformities.  Cardiovascular: Normal rate and regular rhythm.  Respiratory: Effort normal, non-labored breathing  Neuro: Mental  Status: Patient is awake, alert, oriented to person, place, month, year, and situation.  Patient is able to give a clear and coherent history of present illness.  No signs of aphasia or neglect.  Minimal dysarthria noted.   Cranial Nerves: II: Visual Fields are full. Pupils are equal, round, and reactive to light.   III,IV, VI: EOMI without ptosis or diploplia.  V: Facial sensation is symmetric to light touch VII: Facial movement is symmetric VIII: Hearing is intact to voice X: Palate elevates symmetrically XI: Shoulder shrug is symmetric. XII: Tongue protrudes midline Motor: Tone/bulk is normal. 5/5 strength was present in all four extremities with no drift noted Sensory: Sensation is symmetric to light touch in the arms and legs.  Cerebellar: FNF and HKS are intact bilaterally  NIHSS 0 Premorbid MRS 0  ASSESSMENT/PLAN [name] is a 62 y.o. female with history of HLD and difficulty swallowing presenting with fluctuating parasthesia and transient speech disturbance with onset 11/04/2022. Patient was evaluated with an MRI brain on 12/31 that was negative for acute infarction and she was discharged home. Due to ongoing speech disturbance and generalized weakness, patient came back to the ED for further  evaluation on 11/08/22 after being discharged home earlier that day.   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  CT head 12/31: Normal head CT. CTA head & neck no LVO, subtle vessel wall irregularity in the mid cervical ICAs bilaterally suggesting fibromuscular dysplasia.  No significant stenosis is present.  Minimal calcifications at the right carotid bifurcation without significant stenosis.   MRI acute lacunar infarcts in the central pons, left greater than right.  No associated hemorrhage or mass effect. 2D Echo LVEF 60 to 65% no atrial level shunt detected. LDL 139 HgbA1c 6.1% VTE prophylaxis -Lovenox     Diet   Diet Heart Room service appropriate? Yes; Fluid consistency: Thin   No antithrombotic prior to admission, now on aspirin 81 mg daily and clopidogrel 75 mg daily for days followed by aspirin 81 mg PO monotherapy Therapy recommendations: CIR Disposition: Pending  Hyperlipidemia Home meds:  rosuvastatin 10 mg, changed to atorvastatin 80 mg PO daily  LDL 139, goal < 70 Continue statin at discharge  Other Stroke Risk Factors Obesity, Body mass index is 34.75 kg/m., BMI >/= 30 associated with increased stroke risk, recommend weight loss, diet and exercise as appropriate    Hospital day # 4    Patient was  able to tolerate CPAP mask last night and has been randomized to the CPAP treatment in the sleep smart study.   Long discussion with patient and family members at bedside and answered questions.  Stroke team will sign off.  Follow-up as an outpatient with stroke clinic greater than 50% time during this 35-minute visit spent on counseling and coordination of care about lacunar strokes and sleep apnea and answering questions  Antony Contras, MD Medical Director [place] Pager: [phone] 11/13/2022 3:02 PM   To contact Stroke Continuity provider, please refer to [email or web address] After hours, contact General Neurology

## 2022-11-14 DIAGNOSIS — I6381 Other cerebral infarction due to occlusion or stenosis of small artery: Secondary | ICD-10-CM | POA: Diagnosis not present

## 2022-11-14 NOTE — Progress Notes (Signed)
Physical Therapy Session Note  Patient Details  Name: Joy Shelton MRN: [id number] Date of Birth: [birth year]  Today's Date: 11/14/2022 PT Individual Time: 0258-5277 PT Individual Time Calculation (min): 56 min   Short Term Goals: Week 1:  PT Short Term Goal 1 (Week 1): STG=LTG due to LOS  Skilled Therapeutic Interventions/Progress Updates:   Received pt semi-reclined in bed, pt agreeable to PT treatment, and denied any pain during session with emphasis on functional mobility/transfers, generalized strengthening and endurance, dynamic standing balance/coordination, and gait training. Provided pt with nutrition and dietary handouts and reached out to MD regarding consult to dietician per pt request.   Pt performed bed mobility with HOB elevated and supervision x 2 trials throughout session. Donned socks, pants, and shoes sitting EOB with supervision. Pt transferred bed<>WC stand<>pivot without AD and CGA and transported to/from room in [facility] dependently for time management purposes. Pt performed all transfers without AD and CGA throughout session, then ambulated 122ft without AD and min A - pt demonstrating improvements in step length compared to this morning. Pt then performed TUG without AD and CGA with average of 18.6 seconds - educated pt on tests results and significance indicating high fall risk.  Trial 1: 19 seconds  Trial 2: 19 seconds Trial 3: 18 seconds Pt performed BUE/LE strengthening on Nustep at workload 4 increasing to 5 for 8 minutes for a total of 409 steps with emphasis on cardiovascular endurance with steps per minute >45. Pt then performed toe taps to 3 cones without UE support and CGA/light min A for balance x 5 trials. Transitioned to blocked practice sit<>stands on Airex 2x10 reps without UE support and min A fading to CGA for balance with emphasis on quad strength. Pt required a few rest/water breaks throughout session due to fatigue. Pt then navigated through obstacle course  consisting of stepping over hurdles, weaving in/out of cones, and performing single leg toe taps to 3 cones for 71ft with min A - 1 episode of LOB requring mod A to correct. Pt ambulated 156ft without AD and CGA back to room and requested to return to bed - doffed pants standing with close supervision. Concluded session with pt semi-reclined in bed, needs within reach, and bed alarm on.   Therapy Documentation Precautions:  Restrictions Weight Bearing Restrictions: No  Therapy/Group: Individual Therapy Alfonse Alpers PT, DPT  11/14/2022, 6:53 AM

## 2022-11-14 NOTE — Progress Notes (Signed)
Placed patient on home CPAP for the night  

## 2022-11-14 NOTE — Progress Notes (Signed)
PROGRESS NOTE   Subjective/Complaints: Has chronic ocular pain on left side, was told by eye doctor this was due to cataracts but given recent stroke she is more worried about it  ROS: + chronic pain behind left eye Objective:   No results found. No results for input(s): "WBC", "HGB", "HCT", "PLT" in the last 72 hours. No results for input(s): "NA", "K", "CL", "CO2", "GLUCOSE", "BUN", "CREATININE", "CALCIUM" in the last 72 hours.  Intake/Output Summary (Last 24 hours) at 11/14/2022 1709 Last data filed at 11/14/2022 1330 Gross per 24 hour  Intake 360 ml  Output --  Net 360 ml        Physical Exam: Vital Signs Blood pressure (!) 117/58, pulse 68, temperature 98 F (36.7 C), temperature source Oral, resp. rate 16, height 5\' 2"  (1.575 m), weight 86.8 kg, SpO2 96 %. Gen: no distress, normal appearing HEENT: oral mucosa pink and moist, NCAT Cardio: Reg rate Chest: normal effort, normal rate of breathing Abd: soft, non-distended Ext: no edema Psych: pleasant, normal affect Skin: intact Neurological:     Mental Status: She is alert.     Comments: Alert and oriented x 3. Normal insight and awareness. Intact Memory. Normal language and minimal dysarthria. Cranial nerve exam unremarkable. Motor 4+/5 L slightly greater than right. Sl decrease in [facility] R>L UE and LE to lesser extent. Was feeding self using right hand however. Sensory exam normal for light touch and pain in all 4 limbs. No limb ataxia or cerebellar signs. No abnormal tone appreciated.    Assessment/Plan: 1. Functional deficits which require 3+ hours per day of interdisciplinary therapy in a comprehensive inpatient rehab setting. Physiatrist is providing close team supervision and 24 hour management of active medical problems listed below. Physiatrist and rehab team continue to assess barriers to discharge/monitor patient progress toward functional and medical  goals  Care Tool:  Bathing              Bathing assist       Upper Body Dressing/Undressing Upper body dressing        Upper body assist      Lower Body Dressing/Undressing Lower body dressing            Lower body assist       Toileting Toileting    Toileting assist       Transfers Chair/bed transfer  Transfers assist     Chair/bed transfer assist level: Contact Guard/Touching assist     Locomotion Ambulation   Ambulation assist      Assist level: Minimal Assistance - Patient > 75% Assistive device: No Device Max distance: 128ft   Walk 10 feet activity   Assist     Assist level: Minimal Assistance - Patient > 75% Assistive device: No Device   Walk 50 feet activity   Assist    Assist level: Minimal Assistance - Patient > 75% Assistive device: No Device    Walk 150 feet activity   Assist Walk 150 feet activity did not occur: Safety/medical concerns (fatigue)         Walk 10 feet on uneven surface  activity   Assist     Assist level: Minimal  Assistance - Patient > 75%     Wheelchair     Assist Is the patient using a wheelchair?: Yes Type of Wheelchair: Manual    Wheelchair assist level: Dependent - Patient 0% Max wheelchair distance: >132ft    Wheelchair 50 feet with 2 turns activity    Assist        Assist Level: Dependent - Patient 0%   Wheelchair 150 feet activity     Assist      Assist Level: Dependent - Patient 0%   Blood pressure (!) 117/58, pulse 68, temperature 98 F (36.7 C), temperature source Oral, resp. rate 16, height 5\' 2"  (1.575 m), weight 86.8 kg, SpO2 96 %.    Medical Problem List and Plan: 1. 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 2.   Antithrombotics: -DVT/anticoagulation:  Pharmaceutical: Lovenox             -antiplatelet therapy: Aspirin 81 mg and Plavix 75 mg for 3 weeks followed by aspirin 81 mg   3. Pain Management: Tylenol as needed             -headaches are very mild and appear to be only tension-type headaches in left temple area. 4. Mood/Behavior/Sleep: LCSW to evaluate and provide emotional support             -antipsychotic agents: n/a   5. Neuropsych/cognition: This patient is capable of making decisions on her own behalf.   6. Skin/Wound Care: Routine skin care checks   7. Fluids/Electrolytes/Nutrition: Routine I's and O's and follow-up chemistries             -Heart healthy diet   8: Hyperlipidemia: Continue Crestor 40 mg   9: Sleep apnea:  -continue CPAP mask at nighttime (if tolerates)             -screened for Sleep Smart study   10: Nausea: started on Protonix daily   11: Obesity: BMI = 34.75; weight loss counseling, dietician consult ordered as per patient's request 12. Pt reports loose, more urgent stool             -likely d/t change in diet             -hold stool softeners             -add probiotic 13. Left sided pain behind her eye, chronic, recommended calling ophthalmology for follow-up regarding her cataract. 14. Screening for vitamin D deficiency: check vitamin D level on Monday.   LOS: 1 days A FACE TO FACE EVALUATION WAS PERFORMED  Monday Michaeljohn Biss 11/14/2022, 5:09 PM

## 2022-11-14 NOTE — Evaluation (Signed)
Physical Therapy Assessment and Plan  Patient Details  Name: Joy Shelton MRN: [id number] Date of Birth: 07-10-1961  PT Diagnosis: Abnormal posture, Abnormality of gait, Difficulty walking, Impaired sensation, and Muscle weakness Rehab Potential: Good ELOS: 5-7 days   Today's Date: 11/14/2022 PT Individual Time: 9562-1308 PT Individual Time Calculation (min): 74 min    Hospital Problem: Principal Problem:   Lacunar infarct, acute [facility])   Past Medical History:  Past Medical History:  Diagnosis Date   Hypercholesteremia    Past Surgical History: History reviewed. No pertinent surgical history.  Assessment & Plan Clinical Impression: Patient is a 62 y.o. year old female who initially presented to the ED with fluctuating paraesthesia and transient speech disturbance on 11/04/2022.  MRI was negative on 12/31 for acute infarction and she was discharged home.  Due to ongoing speech disturbance and generalized weakness, she returned back to the ED for further evaluation on 11/08/2022.  A repeat MRI of the brain was positive for acute lacunar infarcts in the central pons left greater than right.  There was no associated hemorrhage or mass effect.  CTA of the head and neck was also performed without emergent large vessel occlusions.  She was started on DAPT.  Lovenox started for DVT prophylaxis. She complained of morning nausea and was started on Protonix. Discussed switching to atorvastatin but she stated she was intolerant. Her home dose of rosuvastatin was increased to 40 mg daily. She is tolerating heart healthy diet. The patient requires inpatient physical medicine and rehabilitation evaluations and treatment secondary to dysfunction due to lacunar infarcts.   She did participate in the Sleep Smart clinical trial and had overnight NOx 3 monitor for sleep apnea and tested positive and will have CPAP mask tolerability test. 2D echo: EF of 60 to 65%, normal diastolic parameters, no intracardiac  source of emboli. Hb A1c 6.1, LDL 139,.  Patient currently requires min with mobility secondary to muscle weakness, decreased cardiorespiratoy endurance, decreased coordination, and decreased standing balance, decreased postural control, and decreased balance strategies.  Prior to hospitalization, patient was independent  with mobility and lived with Alone in a House home.  Home access is 1Stairs to enter.  Patient will benefit from skilled PT intervention to maximize safe functional mobility, minimize fall risk, and decrease caregiver burden for planned discharge home with intermittent assist.  Anticipate patient will benefit from follow up OP at discharge.  PT - End of Session Activity Tolerance: Tolerates 30+ min activity with multiple rests Endurance Deficit: Yes Endurance Deficit Description: required rest breaks and limited by fatigue PT Assessment Rehab Potential (ACUTE/IP ONLY): Good PT Barriers to Discharge: Decreased caregiver support;Other (comments) PT Barriers to Discharge Comments: lives alone, decreased balance/coordination PT Patient demonstrates impairments in the following area(s): Balance;Endurance;Motor;Nutrition;Sensory PT Transfers Functional Problem(s): Bed Mobility;Bed to Chair;Car;Furniture PT Locomotion Functional Problem(s): Ambulation;Wheelchair Mobility;Stairs PT Plan PT Intensity: Minimum of 1-2 x/day ,45 to 90 minutes PT Frequency: 5 out of 7 days PT Duration Estimated Length of Stay: 5-7 days PT Treatment/Interventions: Ambulation/gait training;Community reintegration;DME/adaptive equipment instruction;Neuromuscular re-education;Psychosocial support;Stair training;UE/LE Strength taining/ROM;Balance/vestibular training;Discharge planning;Pain management;Therapeutic Activities;UE/LE Coordination activities;Cognitive remediation/compensation;Disease management/prevention;Functional mobility training;Patient/family education;Therapeutic Exercise;Visual/perceptual  remediation/compensation PT Transfers Anticipated Outcome(s): Mod I PT Locomotion Anticipated Outcome(s): Mod I PT Recommendation Follow Up Recommendations: Outpatient PT Patient destination: Home Equipment Recommended: To be determined Equipment Details: has RW and SPC that were her dad's   PT Evaluation Precautions/Restrictions Precautions Precautions: Fall Restrictions Weight Bearing Restrictions: No Pain Interference Pain Interference Pain Effect on Sleep: 0. Does  not apply - I have not had any pain or hurting in the past 5 days Pain Interference with Therapy Activities: 0. Does not apply - I have not received rehabilitationtherapy in the past 5 days Pain Interference with Day-to-Day Activities: 1. Rarely or not at all Home Living/Prior Functioning Home Living Available Help at Discharge: Family;Available 24 hours/day (sister and dad) Type of Home: House Home Access: Stairs to enter [employer] of Steps: 1 Entrance Stairs-Rails: None Home Layout: One level Bathroom Shower/Tub: Engineer, manufacturing systems: Handicapped height Bathroom Accessibility: Yes Additional Comments: has a RW and cane that are her dad's  Lives With: Alone Prior Function Level of Independence: Independent with basic ADLs;Independent with transfers;Independent with homemaking with ambulation;Independent with gait  Able to Take Stairs?: Yes Driving: Yes Vocation: Full time employment Vocation Requirements: accountant Vision/Perception  Vision - History Ability to See in Adequate Light: 0 Adequate Perception Perception: Within Functional Limits Praxis Praxis: Intact  Cognition Overall Cognitive Status: Within Functional Limits for tasks assessed Arousal/Alertness: Awake/alert Orientation Level: Oriented X4 Memory: Appears intact Awareness: Appears intact Problem Solving: Appears intact Safety/Judgment: Appears intact Sensation Sensation Light Touch: Appears  Intact Proprioception: Appears Intact Additional Comments: Pt describes "tingling" on L side of head/L-eye, present at times around mouth. Coordination Gross Motor Movements are Fluid and Coordinated: No Fine Motor Movements are Fluid and Coordinated: Yes Motor  Motor Motor: Other (comment) Motor - Skilled Clinical Observations: mild uncoordination due to decreased balance/coordination and decreased activity tolerance  Trunk/Postural Assessment  Cervical Assessment Cervical Assessment: Within Functional Limits Thoracic Assessment Thoracic Assessment: Within Functional Limits Lumbar Assessment Lumbar Assessment: Within Functional Limits Postural Control Postural Control: Deficits on evaluation Righting Reactions: delayed, pt with LOB with turns requiring assist to correct  Balance Balance Balance Assessed: Yes Static Sitting Balance Static Sitting - Balance Support: Feet supported;No upper extremity supported Static Sitting - Level of Assistance: 6: Modified independent (Device/Increase time) Dynamic Sitting Balance Dynamic Sitting - Balance Support: Feet supported;No upper extremity supported Dynamic Sitting - Level of Assistance: 5: Stand by assistance (supervision) Static Standing Balance Static Standing - Balance Support: No upper extremity supported;During functional activity Static Standing - Level of Assistance: 5: Stand by assistance (CGA) Dynamic Standing Balance Dynamic Standing - Balance Support: No upper extremity supported;During functional activity Dynamic Standing - Level of Assistance: 4: Min assist Dynamic Standing - Comments: CGA with transfers, min A with gait Extremity Assessment  RLE Assessment RLE Assessment: Exceptions to [facility] General Strength Comments: grossly generalized to 4/5 - tested in sitting LLE Assessment LLE Assessment: Exceptions to [facility] General Strength Comments: grossly generalized to 4/5 - tested in sitting  Care Tool Care Tool Bed  Mobility Roll left and right activity   Roll left and right assist level: Supervision/Verbal cueing    Sit to lying activity        Lying to sitting on side of bed activity   Lying to sitting on side of bed assist level: the ability to move from lying on the back to sitting on the side of the bed with no back support.: Supervision/Verbal cueing     Care Tool Transfers Sit to stand transfer   Sit to stand assist level: Contact Guard/Touching assist    Chair/bed transfer   Chair/bed transfer assist level: Contact Guard/Touching assist     Toilet transfer   Assist Level: Contact Guard/Touching assist    Car transfer   Car transfer assist level: Contact Guard/Touching assist      Care Tool Locomotion  Ambulation   Assist level: Minimal Assistance - Patient > 75% Assistive device: No Device Max distance: 125ft  Walk 10 feet activity   Assist level: Minimal Assistance - Patient > 75% Assistive device: No Device   Walk 50 feet with 2 turns activity   Assist level: Minimal Assistance - Patient > 75% Assistive device: No Device  Walk 150 feet activity Walk 150 feet activity did not occur: Safety/medical concerns (fatigue)      Walk 10 feet on uneven surfaces activity   Assist level: Minimal Assistance - Patient > 75%    Stairs   Assist level: Contact Guard/Touching assist Stairs assistive device: 2 hand rails Max number of stairs: 12 (6in)  Walk up/down 1 step activity   Walk up/down 1 step (curb) assist level: Contact Guard/Touching assist Walk up/down 1 step or curb assistive device: 2 hand rails  Walk up/down 4 steps activity   Walk up/down 4 steps assist level: Contact Guard/Touching assist Walk up/down 4 steps assistive device: 2 hand rails  Walk up/down 12 steps activity   Walk up/down 12 steps assist level: Contact Guard/Touching assist Walk up/down 12 steps assistive device: 2 hand rails  Pick up small objects from floor   Pick up small object from the floor  assist level: Contact Guard/Touching assist Pick up small object from the floor assistive device: shoe using grab bar for stability  Wheelchair Is the patient using a wheelchair?: Yes Type of Wheelchair: Manual   Wheelchair assist level: Dependent - Patient 0% Max wheelchair distance: >137ft  Wheel 50 feet with 2 turns activity   Assist Level: Dependent - Patient 0%  Wheel 150 feet activity   Assist Level: Dependent - Patient 0%    Refer to Care Plan for Long Term Goals  SHORT TERM GOAL WEEK 1 PT Short Term Goal 1 (Week 1): STG=LTG due to LOS  Recommendations for other services: None   Skilled Therapeutic Intervention Evaluation completed (see details above and below) with education on PT POC and goals and individual treatment initiated with focus on functional mobility/transfers, generalized strengthening and endurance, dynamic standing balance/coordination, simulated car transfers, and ambulation. Received pt semi-reclined in bed, pt educated on PT evaluation, CIR policies, and therapy schedule and agreeable. Pt denied any pain during session. Provided pt with 18x16 manual WC, RW, and scrub top. Pt donned pants in supine with supervision and transferred semi-reclined<>sitting EOB with HOB elevated and supervision. Doffed dirty gown and donned scrub top with supervision. Pt stood without AD and CGA to pull pants over hips and ambulated in/out of bathroom without AD and min A but reaching to furniture walk. Pt performed clothing management with CGA and continent of bowel and bladder, although brief soiled from "not making it in time". Pt performed hygiene management without assist and stood at sink to perform hand hygiene with supervision. Pt transported to/from room in [facility] dependently for time management purposes. Pt stood from [facility] without AD and CGA and performed ambulatory simulated car transfer without AD and CGA. Pt then ambulated 86ft on uneven surfaces (ramp) without AD and min A with 1 LOB  when turning. Pt navigated 12 steps with bilateral handrails and CGA ascending and descending with a step through pattern. Pt then stood without AD and CGA and ambulated 131ft without AD and min A - limited by fatigue. Pt ambulates with narrow BOS, decreased step length, and is overall fearful and cautious. Returned to room and concluded session with pt sitting in WC, needs within reach,  and chair pad alarm on. Safety plan updated.   Mobility Bed Mobility Bed Mobility: Rolling Left;Supine to Sit Rolling Left: Supervision/Verbal cueing Supine to Sit: Supervision/Verbal cueing Transfers Transfers: Sit to Stand;Stand to Sit;Stand Pivot Transfers Sit to Stand: Contact Guard/Touching assist Stand to Sit: Supervision/Verbal cueing Stand Pivot Transfers: Contact Guard/Touching assist Transfer (Assistive device): None Locomotion  Gait Ambulation: Yes Gait Assistance: Minimal Assistance - Patient > 75% Gait Distance (Feet): 140 Feet Assistive device: None Gait Assistance Details: Verbal cues for gait pattern Gait Assistance Details: verbal cues to increase step length, however pt fearful stating she's "not ready to do that yet" Gait Gait: Yes Gait Pattern: Impaired Gait Pattern: Step-to pattern;Decreased step length - right;Decreased step length - left;Decreased stride length;Poor foot clearance - right;Poor foot clearance - left;Narrow base of support;Shuffle Gait velocity: decreased Stairs / Additional Locomotion Stairs: Yes Stairs Assistance: Contact Guard/Touching assist Stair Management Technique: Two rails Number of Stairs: 12 Height of Stairs: 6 Ramp: Minimal Assistance - Patient >75% Wheelchair Mobility Wheelchair Mobility: No   Discharge Criteria: Patient will be discharged from PT if patient refuses treatment 3 consecutive times without medical reason, if treatment goals not met, if there is a change in medical status, if patient makes no progress towards goals or if patient is  discharged from hospital.  The above assessment, treatment plan, treatment alternatives and goals were discussed and mutually agreed upon: by patient  Martin Majestic PT, DPT  11/14/2022, 12:14 PM

## 2022-11-14 NOTE — Progress Notes (Signed)
Inpatient Rehabilitation Admission Medication Review by a Pharmacist  A complete drug regimen review was completed for this patient to identify any potential clinically significant medication issues.  High Risk Drug Classes Is patient taking? Indication by Medication  Antipsychotic Yes Compazine-N/V  Anticoagulant Yes Lovenox -DVT ppx  Antibiotic No   Opioid No   Antiplatelet Yes ASA, plavix(x3 weeks)- CVA  Hypoglycemics/insulin No   Vasoactive Medication No   Chemotherapy No   Other Yes Protonix-GERD Crestor-HLD Vitamin D- deficiency Benadryl-itching MoM, fleet, sorbitol-constipation -trazodone-sleep Robaxin-muscle spasms Robitussin-cough     Type of Medication Issue Identified Description of Issue Recommendation(s)  Drug Interaction(s) (clinically significant)     Duplicate Therapy     Allergy     No Medication Administration End Date     Incorrect Dose     Additional Drug Therapy Needed     Significant med changes from prior encounter (inform family/care partners about these prior to discharge).    Other       Clinically significant medication issues were identified that warrant physician communication and completion of prescribed/recommended actions by midnight of the next day:  No  Name of provider notified for urgent issues identified:   Provider Method of Notification:    Pharmacist comments:   Time spent performing this drug regimen review (minutes):  20 minutes    Sandford Craze, PharmD. [facility] Acute Care PGY-1  11/14/2022 9:32 AM

## 2022-11-14 NOTE — Plan of Care (Signed)
  Problem: RH Balance Goal: LTG Patient will maintain dynamic standing with ADLs (OT) Description: LTG:  Patient will maintain dynamic standing balance with assist during activities of daily living (OT)  Flowsheets (Taken 11/14/2022 2050) LTG: Pt will maintain dynamic standing balance during ADLs with: Independent with assistive device   Problem: Sit to Stand Goal: LTG:  Patient will perform sit to stand in prep for activites of daily living with assistance level (OT) Description: LTG:  Patient will perform sit to stand in prep for activites of daily living with assistance level (OT) Flowsheets (Taken 11/14/2022 2050) LTG: PT will perform sit to stand in prep for activites of daily living with assistance level: Independent with assistive device   Problem: RH Bathing Goal: LTG Patient will bathe all body parts with assist levels (OT) Description: LTG: Patient will bathe all body parts with assist levels (OT) Flowsheets (Taken 11/14/2022 2050) LTG: Pt will perform bathing with assistance level/cueing: Independent with assistive device    Problem: RH Dressing Goal: LTG Patient will perform lower body dressing w/assist (OT) Description: LTG: Patient will perform lower body dressing with assist, with/without cues in positioning using equipment (OT) Flowsheets (Taken 11/14/2022 2050) LTG: Pt will perform lower body dressing with assistance level of: Independent with assistive device   Problem: RH Toileting Goal: LTG Patient will perform toileting task (3/3 steps) with assistance level (OT) Description: LTG: Patient will perform toileting task (3/3 steps) with assistance level (OT)  Flowsheets (Taken 11/14/2022 2050) LTG: Pt will perform toileting task (3/3 steps) with assistance level: Independent with assistive device   Problem: RH Simple Meal Prep Goal: LTG Patient will perform simple meal prep w/assist (OT) Description: LTG: Patient will perform simple meal prep with assistance, with/without cues  (OT). Flowsheets (Taken 11/14/2022 2050) LTG: Pt will perform simple meal prep with assistance level of: Independent with assistive device   Problem: RH Laundry Goal: LTG Patient will perform laundry w/assist, cues (OT) Description: LTG: Patient will perform laundry with assistance, with/without cues (OT). Flowsheets (Taken 11/14/2022 2050) LTG: Pt will perform laundry with assistance level of: Independent with assistive device   Problem: RH Light Housekeeping Goal: LTG Patient will perform light housekeeping w/assist (OT) Description: LTG: Patient will perform light housekeeping with assistance, with/without cues (OT). Flowsheets (Taken 11/14/2022 2050) LTG: Pt will perform light housekeeping with assistance level of: Independent with assistive device   Problem: RH Toilet Transfers Goal: LTG Patient will perform toilet transfers w/assist (OT) Description: LTG: Patient will perform toilet transfers with assist, with/without cues using equipment (OT) Flowsheets (Taken 11/14/2022 2050) LTG: Pt will perform toilet transfers with assistance level of: Independent with assistive device   Problem: RH Tub/Shower Transfers Goal: LTG Patient will perform tub/shower transfers w/assist (OT) Description: LTG: Patient will perform tub/shower transfers with assist, with/without cues using equipment (OT) Flowsheets (Taken 11/14/2022 2050) LTG: Pt will perform tub/shower stall transfers with assistance level of: Independent with assistive device

## 2022-11-14 NOTE — Evaluation (Addendum)
Occupational Therapy Assessment and Plan  Patient Details  Name: Joy Shelton MRN: [id number] Date of Birth: 05-01-1961  OT Diagnosis: muscle weakness (generalized) and decreased activity tolerance Rehab Potential: Rehab Potential (ACUTE ONLY): Excellent ELOS: 5-7 days   Today's Date: 11/14/2022 OT Individual Time:1022-1106 (44 min) OT Missed Time: 16 min (Other)  Hospital Problem: Principal Problem:   Lacunar infarct, acute ([place])   Past Medical History:  Past Medical History:  Diagnosis Date   Hypercholesteremia    Past Surgical History: History reviewed. No pertinent surgical history.  Assessment & Plan Clinical Impression: Patient is a 62 year old female who initially presented to the ED with fluctuating paraesthesia and transient speech disturbance on 11/04/2022. MRI was negative on 12/31 for acute infarction and she was discharged home. Due to ongoing speech disturbance and generalized weakness, she returned back to the ED for further evaluation on 11/08/2022.  A repeat MRI of the brain was positive for acute lacunar infarcts in the central pons left greater than right. There was no associated hemorrhage or mass effect.  CTA of the head and neck was also performed without emergent large vessel occlusions. Patient transferred to CIR on 11/13/2022 .    Patient currently requires supervision-CGA with basic self-care skills secondary to muscle weakness and decreased cardiorespiratoy endurance.  Prior to hospitalization, patient could complete all ADLs/IADLs independently.  Patient will benefit from skilled intervention to increase independence with basic self-care skills and increase level of independence with iADL prior to discharge home with care partner.  Anticipate patient will require intermittent supervision and follow up outpatient.  OT - End of Session Activity Tolerance: Tolerates 30+ min activity with multiple rests Endurance Deficit: Yes OT Assessment Rehab Potential  (ACUTE ONLY): Excellent OT Barriers to Discharge: Lack of/limited family support OT Plan OT Intensity: Minimum of 1-2 x/day, 45 to 90 minutes OT Frequency: 5 out of 7 days OT Duration/Estimated Length of Stay: 5-7 days OT Treatment/Interventions: Balance/vestibular training;Discharge planning;DME/adaptive equipment instruction;Functional mobility training;Neuromuscular re-education;Patient/family education;Psychosocial support;Self Care/advanced ADL retraining;Therapeutic Activities;Therapeutic Exercise;UE/LE Strength taining/ROM;UE/LE Coordination activities OT Recommendation Recommendations for Other Services: Neuropsych consult;Therapeutic Recreation consult Therapeutic Recreation Interventions: Pet therapy;Kitchen group;Stress management;Outing/community reintergration Patient destination: Home Follow Up Recommendations: Outpatient OT Equipment Recommended: To be determined   OT Evaluation Precautions/Restrictions  Precautions Precautions: Fall Restrictions Weight Bearing Restrictions: No General Chart Reviewed: Yes OT Amount of Missed Time: 16 Minutes PT Missed Treatment Reason: Other (Comment) Family/Caregiver Present: No Vital Signs Therapy Vitals Temp: 98.1 F (36.7 C) Temp Source: Oral Pulse Rate: 74 Resp: 18 BP: 110/62 Patient Position (if appropriate): Lying Oxygen Therapy SpO2: 96 % O2 Device: Room Air Pain   Home Living/Prior Functioning Home Living Available Help at Discharge: Family, Available 24 hours/day Type of Home: House Home Access: Stairs to enter Technical brewer of Steps: 1 Entrance Stairs-Rails: None Home Layout: One level Bathroom Shower/Tub: Chiropodist: Handicapped height Bathroom Accessibility: Yes Additional Comments: Pt has a RW and cane, which belong to her dad.  Lives With: Alone IADL History Homemaking Responsibilities: Yes Meal Prep Responsibility: Primary Laundry Responsibility: Primary Cleaning  Responsibility: Primary Bill Paying/Finance Responsibility: Primary Shopping Responsibility: Primary Current License: Yes Mode of Transportation: Car Occupation: Full time employment Prior Function Level of Independence: Independent with basic ADLs, Independent with transfers, Independent with homemaking with ambulation, Independent with gait  Able to Take Stairs?: Yes Driving: Yes Vocation: Full time employment Vocation Requirements: Accountant Vision Baseline Vision/History: 1 Wears glasses (Readers) Ability to See in Adequate Light: 0 Adequate  Patient Visual Report: No change from baseline Vision Assessment?: No apparent visual deficits Perception  Perception: Within Functional Limits Praxis Praxis: Intact Cognition Cognition Overall Cognitive Status: Within Functional Limits for tasks assessed Arousal/Alertness: Awake/alert Orientation Level: Person;Place;Situation Memory: Appears intact Attention: Selective;Sustained Awareness: Appears intact Problem Solving: Appears intact Safety/Judgment: Appears intact Brief Interview for Mental Status (BIMS) Repetition of Three Words (First Attempt): 3 Temporal Orientation: Year: Correct Temporal Orientation: Month: Accurate within 5 days Temporal Orientation: Day: Correct Recall: "Sock": Yes, no cue required Recall: "Blue": Yes, no cue required Recall: "Bed": Yes, no cue required BIMS Summary Score: 15 Sensation Sensation Light Touch: Appears Intact Proprioception: Appears Intact Additional Comments: Pt describes "tingling" on L side of head/L-eye, present at times around mouth. Coordination Gross Motor Movements are Fluid and Coordinated: No Fine Motor Movements are Fluid and Coordinated: Yes Coordination and Movement Description: Mild uncoordination due to decreased balance/coordination and decreased activity tolerance. Motor  Motor Motor: Other (comment) Motor - Skilled Clinical Observations: Mild uncoordination due to  decreased balance/coordination and decreased activity tolerance.  Trunk/Postural Assessment  Cervical Assessment Cervical Assessment: Within Functional Limits Thoracic Assessment Thoracic Assessment: Within Functional Limits Lumbar Assessment Lumbar Assessment: Within Functional Limits Postural Control Postural Control: Deficits on evaluation Righting Reactions: Delayed, pt with LOB with turns requiring assist to correct.  Balance Balance Balance Assessed: Yes Static Sitting Balance Static Sitting - Balance Support: Feet supported;No upper extremity supported Static Sitting - Level of Assistance: 6: Modified independent (Device/Increase time) Dynamic Sitting Balance Dynamic Sitting - Balance Support: During functional activity;Feet supported Dynamic Sitting - Level of Assistance: 5: Stand by assistance (Supervision) Static Standing Balance Static Standing - Balance Support: During functional activity;No upper extremity supported Static Standing - Level of Assistance: 5: Stand by assistance (CGA) Dynamic Standing Balance Dynamic Standing - Balance Support: No upper extremity supported;During functional activity Dynamic Standing - Level of Assistance: 4: Min assist Extremity/Trunk Assessment RUE Assessment RUE Assessment: Within Functional Limits LUE Assessment LUE Assessment: Within Functional Limits  Care Tool Care Tool Self Care Eating   Eating Assist Level: Independent    Oral Care    Oral Care Assist Level:  (Supervision)    Bathing   Body parts bathed by patient: Right arm;Left arm;Chest;Abdomen;Front perineal area;Buttocks;Right upper leg;Left upper leg;Right lower leg;Left lower leg;Face     Assist Level: Supervision/Verbal cueing    Upper Body Dressing(including orthotics)   What is the patient wearing?: Pull over shirt   Assist Level: Supervision/Verbal cueing    Lower Body Dressing (excluding footwear)   What is the patient wearing?: Underwear/pull  up;Pants Assist for lower body dressing: Supervision/Verbal cueing    Putting on/Taking off footwear   What is the patient wearing?: Shoes;Socks Assist for footwear: Supervision/Verbal cueing       Care Tool Toileting Toileting activity   Assist for toileting: Contact Guard/Touching assist     Care Tool Bed Mobility Roll left and right activity        Sit to lying activity   Sit to lying assist level: Supervision/Verbal cueing    Lying to sitting on side of bed activity         Care Tool Transfers Sit to stand transfer   Sit to stand assist level: Contact Guard/Touching assist    Chair/bed transfer         Toilet transfer   Assist Level: Contact Guard/Touching assist     Care Tool Cognition  Expression of Ideas and Wants Expression of Ideas and Wants: 4. Without difficulty (  complex and basic) - expresses complex messages without difficulty and with speech that is clear and easy to understand  Understanding Verbal and Non-Verbal Content Understanding Verbal and Non-Verbal Content: 4. Understands (complex and basic) - clear comprehension without cues or repetitions   Memory/Recall Ability Memory/Recall Ability : Current season;Location of own room;Staff names and faces;That he or she is in a hospital/hospital unit   Refer to Care Plan for Long Term Goals  SHORT TERM GOAL WEEK 1 OT Short Term Goal 1 (Week 1): STGs=LTGs due to patient's length of stay.  Recommendations for other services: Neuropsych and Therapeutic Recreation  Pet therapy, Kitchen group, and Stress management   Skilled Therapeutic Intervention Pt received seated in [facility] for skilled OT session with focus on ADL retraining and functional mobility. Pt agreeable to interventions, demonstrating overall pleasant mood. Pt with no reports of pain. OT offering intermediate rest breaks and positioning suggestions throughout session to address potential pain/fatigue and maximize participation/safety in session.    Session beginning with discussion surrounding OT role, OT POC, and general orientation to rehab unit/schedule. Pt performing ADLs and functional transfers with level of assist noted below. Pt becoming tearful when discussing functioning and discharge, sharing increased fear/anxiety in regards to having another stroke. Pt provided with active listening and education on neuropsych services.   In day room, pt participates in functional mobility activity, simulating house-hold functional mobility, without use of AD and overall CGA.   Pt remained resting in bed with all immediate needs met and bed alarm activated at end of session. Pt continues to be appropriate for skilled OT intervention to promote further functional independence.   ADL ADL Eating: Independent Grooming: Contact guard Where Assessed-Grooming: Standing at sink Upper Body Bathing: Setup Where Assessed-Upper Body Bathing: Wheelchair Lower Body Bathing: Contact guard Where Assessed-Lower Body Bathing: Edge of bed Upper Body Dressing: Setup Where Assessed-Upper Body Dressing: Wheelchair Lower Body Dressing: Contact guard Where Assessed-Lower Body Dressing: Edge of bed Toileting: Contact guard Where Assessed-Toileting: Teacher, adult education: Furniture conservator/restorer Method: Proofreader: Acupuncturist: Administrator, arts Method: Designer, industrial/product: Information systems manager with back Mobility  Bed Mobility Bed Mobility: Sit to Supine Sit to Supine: Supervision/Verbal cueing Transfers Sit to Stand: Contact Guard/Touching assist Stand to Sit: Supervision/Verbal cueing   Discharge Criteria: Patient will be discharged from OT if patient refuses treatment 3 consecutive times without medical reason, if treatment goals not met, if there is a change in medical status, if patient makes no progress towards goals or if patient is discharged from  hospital.  The above assessment, treatment plan, treatment alternatives and goals were discussed and mutually agreed upon: by patient  Lou Cal, OTR/L, MSOT  11/14/2022, 8:53 PM

## 2022-11-15 DIAGNOSIS — I6381 Other cerebral infarction due to occlusion or stenosis of small artery: Secondary | ICD-10-CM | POA: Diagnosis not present

## 2022-11-15 MED ORDER — METHOCARBAMOL 500 MG PO TABS: 500.0000 mg | ORAL_TABLET | Freq: Three times a day (TID) | ORAL | Status: DC | PRN

## 2022-11-15 NOTE — Progress Notes (Signed)
PROGRESS NOTE   Subjective/Complaints: Iv d/ced Grounds pass ordered No new complaints Reading her patient binder!  ROS: + chronic pain behind left eye, +anxiety about having another stroke Objective:   No results found. No results for input(s): "WBC", "HGB", "HCT", "PLT" in the last 72 hours. No results for input(s): "NA", "K", "CL", "CO2", "GLUCOSE", "BUN", "CREATININE", "CALCIUM" in the last 72 hours.  Intake/Output Summary (Last 24 hours) at 11/15/2022 1203 Last data filed at 11/15/2022 0715 Gross per 24 hour  Intake 720 ml  Output --  Net 720 ml        Physical Exam: Vital Signs Blood pressure (!) 116/54, pulse 75, temperature 97.9 F (36.6 C), temperature source Oral, resp. rate 18, height 5\' 2"  (1.575 m), weight 86.8 kg, SpO2 98 %. Gen: no distress, normal appearing, BMI 35 HEENT: oral mucosa pink and moist, NCAT Cardio: Reg rate Chest: normal effort, normal rate of breathing Abd: soft, non-distended Ext: no edema Psych: pleasant, normal affect Skin: intact Neurological:     Mental Status: She is alert.     Comments: Alert and oriented x 3. Normal insight and awareness. Intact Memory. Normal language and minimal dysarthria. Cranial nerve exam unremarkable. Motor 4+/5 L slightly greater than right. Sl decrease in [facility] R>L UE and LE to lesser extent. Was feeding self using right hand however. Sensory exam normal for light touch and pain in all 4 limbs. No limb ataxia or cerebellar signs. No abnormal tone appreciated.    Assessment/Plan: 1. Functional deficits which require 3+ hours per day of interdisciplinary therapy in a comprehensive inpatient rehab setting. Physiatrist is providing close team supervision and 24 hour management of active medical problems listed below. Physiatrist and rehab team continue to assess barriers to discharge/monitor patient progress toward functional and medical goals  Care  Tool:  Bathing    Body parts bathed by patient: Right arm, Left arm, Chest, Abdomen, Front perineal area, Buttocks, Right upper leg, Left upper leg, Right lower leg, Left lower leg, Face         Bathing assist Assist Level: Supervision/Verbal cueing     Upper Body Dressing/Undressing Upper body dressing   What is the patient wearing?: Pull over shirt    Upper body assist Assist Level: Supervision/Verbal cueing    Lower Body Dressing/Undressing Lower body dressing      What is the patient wearing?: Underwear/pull up, Pants     Lower body assist Assist for lower body dressing: Supervision/Verbal cueing     Toileting Toileting    Toileting assist Assist for toileting: Contact Guard/Touching assist     Transfers Chair/bed transfer  Transfers assist     Chair/bed transfer assist level: Contact Guard/Touching assist     Locomotion Ambulation   Ambulation assist      Assist level: Minimal Assistance - Patient > 75% Assistive device: No Device Max distance: 157ft   Walk 10 feet activity   Assist     Assist level: Minimal Assistance - Patient > 75% Assistive device: No Device   Walk 50 feet activity   Assist    Assist level: Minimal Assistance - Patient > 75% Assistive device: No Device  Walk 150 feet activity   Assist Walk 150 feet activity did not occur: Safety/medical concerns (fatigue)         Walk 10 feet on uneven surface  activity   Assist     Assist level: Minimal Assistance - Patient > 75%     Wheelchair     Assist Is the patient using a wheelchair?: Yes Type of Wheelchair: Manual    Wheelchair assist level: Dependent - Patient 0% Max wheelchair distance: >142ft    Wheelchair 50 feet with 2 turns activity    Assist        Assist Level: Dependent - Patient 0%   Wheelchair 150 feet activity     Assist      Assist Level: Dependent - Patient 0%   Blood pressure (!) 116/54, pulse 75,  temperature 97.9 F (36.6 C), temperature source Oral, resp. rate 18, height 5\' 2"  (1.575 m), weight 86.8 kg, SpO2 98 %.    Medical Problem List and Plan: 1. 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  Provided dietary education 2.  Antithrombotics: -DVT/anticoagulation:  Pharmaceutical: Lovenox             -antiplatelet therapy: Aspirin 81 mg and Plavix 75 mg for 3 weeks followed by aspirin 81 mg   3. Pain Management: Tylenol as needed             -headaches are very mild and appear to be only tension-type headaches in left temple area. 4. Mood/Behavior/Sleep: LCSW to evaluate and provide emotional support             -antipsychotic agents: n/a   5. Neuropsych/cognition: This patient is capable of making decisions on her own behalf.   6. Skin/Wound Care: Routine skin care checks   7. Fluids/Electrolytes/Nutrition: Routine I's and O's and follow-up chemistries             -Heart healthy diet   8: Hyperlipidemia: Continue Crestor 40 mg   9: Sleep apnea:  -continue CPAP mask at nighttime (if tolerates)             -screened for Sleep Smart study   10: Nausea: started on Protonix daily   11: Obesity: BMI = 34.75-->35; weight loss counseling, dietician consult ordered as per patient's request, provided dietary education.  12. Pt reports loose, more urgent stool             -likely d/t change in diet             -hold stool softeners             -add probiotic  -prn imodium ordered 13. Left sided pain behind her eye, chronic, recommended calling ophthalmology for follow-up regarding her cataract. 14. Screening for vitamin D deficiency: check vitamin D level on Monday.  15. Hypotension: decrease Robaxin to q8H prn  LOS: 2 days A FACE TO FACE EVALUATION WAS  PERFORMED  Sunday P Rody Keadle 11/15/2022, 12:03 PM

## 2022-11-15 NOTE — Progress Notes (Signed)
Physical Therapy Session Note  Patient Details  Name: Joy Shelton MRN: [id number] Date of Birth: 06-07-1961  Today's Date: 11/15/2022 PT Individual Time: 1300-1400 PT Individual Time Calculation (min): 60 min   Short Term Goals: Week 1:  PT Short Term Goal 1 (Week 1): STG=LTG due to LOS  Skilled Therapeutic Interventions/Progress Updates:      Therapy Documentation Precautions:  Precautions Precautions: Fall Restrictions Weight Bearing Restrictions: No  Pt agreeable to PT session with emphasis on gait and high level balance assessments/training. Pt without verbal reports of pain in session and participated in following outcome measures:   - normal speed: 2.19 ft/s   - fast speed: 3.52 ft/s  -TUG average: 11.43 seconds  -Berg: 51/56: moderate fall risks  -FGA: 16/30   Assessments indicate pt is ambulating at a community level and is at moderate risk for falls. Pt required CGA/SBA with transfers and gait > 300 ft throughout session without AD. Pt left seated in w/c at bedside with all needs in reach.     Balance: Balance Balance Assessed: Yes Standardized Balance Assessment Standardized Balance Assessment: Timed Up and Go Test;Functional Gait Assessment Berg Balance Test Sit to Stand: Able to stand without using hands and stabilize independently Standing Unsupported: Able to stand safely 2 minutes Sitting with Back Unsupported but Feet Supported on Floor or Stool: Able to sit safely and securely 2 minutes Stand to Sit: Sits safely with minimal use of hands Transfers: Able to transfer safely, minor use of hands Standing Unsupported with Eyes Closed: Able to stand 10 seconds safely Standing Ubsupported with Feet Together: Able to place feet together independently and stand 1 minute safely From Standing, Reach Forward with Outstretched Arm: Can reach confidently >25 cm (10") From Standing Position, Pick up Object from Floor: Able to pick up shoe safely and  easily From Standing Position, Turn to Look Behind Over each Shoulder: Looks behind from both sides and weight shifts well Turn 360 Degrees: Able to turn 360 degrees safely one side only in 4 seconds or less Standing Unsupported, Alternately Place Feet on Step/Stool: Able to stand independently and safely and complete 8 steps in 20 seconds Standing Unsupported, One Foot in Front: Needs help to step but can hold 15 seconds Standing on One Leg: Able to lift leg independently and hold 5-10 seconds Total Score: 51 Timed Up and Go Test TUG: Normal TUG Normal TUG (seconds): 11.43 Functional Gait  Assessment Gait assessed : Yes Gait Level Surface: Walks 20 ft, slow speed, abnormal gait pattern, evidence for imbalance or deviates 10-15 in outside of the 12 in walkway width. Requires more than 7 sec to ambulate 20 ft. Change in Gait Speed: Able to smoothly change walking speed without loss of balance or gait deviation. Deviate no more than 6 in outside of the 12 in walkway width. Gait with Horizontal Head Turns: Performs head turns smoothly with slight change in gait velocity (eg, minor disruption to smooth gait path), deviates 6-10 in outside 12 in walkway width, or uses an assistive device. Gait with Vertical Head Turns: Performs task with slight change in gait velocity (eg, minor disruption to smooth gait path), deviates 6 - 10 in outside 12 in walkway width or uses assistive device Gait and Pivot Turn: Pivot turns safely in greater than 3 sec and stops with no loss of balance, or pivot turns safely within 3 sec and stops with mild imbalance, requires small steps to catch balance. Step Over Obstacle: Is able to step over  one shoe box (4.5 in total height) without changing gait speed. No evidence of imbalance. Gait with Narrow Base of Support: Ambulates less than 4 steps heel to toe or cannot perform without assistance. Gait with Eyes Closed: Walks 20 ft, slow speed, abnormal gait pattern, evidence for  imbalance, deviates 10-15 in outside 12 in walkway width. Requires more than 9 sec to ambulate 20 ft. Ambulating Backwards: Walks 20 ft, uses assistive device, slower speed, mild gait deviations, deviates 6-10 in outside 12 in walkway width. Steps: Two feet to a stair, must use rail. Total Score: 16  Patient demonstrates increased fall risk as noted by score of   51/56 on Berg Balance Scale.  (<36= high risk for falls, close to 100%; 37-45 significant >80%; 46-51 moderate >50%; 52-55 lower >25%)  Patient demonstrates increased fall risk as noted by score of 16/30 on  Functional Gait Assessment.   <22/30 = predictive of falls, <20/30 = fall in 6 months, <18/30 = predictive of falls in PD MCID: 5 points stroke population, 4 points geriatric population (ANPTA Core Set of Outcome Measures for Adults with Neurologic Conditions, 2018)      Therapy/Group: Individual Therapy  Verl Dicker 11/15/2022, 1:52 PM

## 2022-11-15 NOTE — Progress Notes (Signed)
Pt has home CPAP set up at bedside and states she does not need any assistance and will place on herself when ready.  

## 2022-11-16 DIAGNOSIS — I959 Hypotension, unspecified: Secondary | ICD-10-CM

## 2022-11-16 DIAGNOSIS — I6381 Other cerebral infarction due to occlusion or stenosis of small artery: Secondary | ICD-10-CM | POA: Diagnosis not present

## 2022-11-16 DIAGNOSIS — R195 Other fecal abnormalities: Secondary | ICD-10-CM | POA: Diagnosis not present

## 2022-11-16 LAB — CBC
HCT: 42.1 % (ref 36.0–46.0)
Hemoglobin: 14.3 g/dL (ref 12.0–15.0)
MCH: 28.8 pg (ref 26.0–34.0)
MCHC: 34 g/dL (ref 30.0–36.0)
MCV: 84.9 fL (ref 80.0–100.0)
Platelets: 261 10*3/uL (ref 150–400)
RBC: 4.96 MIL/uL (ref 3.87–5.11)
RDW: 13 % (ref 11.5–15.5)
WBC: 9.1 10*3/uL (ref 4.0–10.5)
nRBC: 0 % (ref 0.0–0.2)

## 2022-11-16 LAB — BASIC METABOLIC PANEL
Anion gap: 10 (ref 5–15)
BUN: 16 mg/dL (ref 6–20)
CO2: 23 mmol/L (ref 22–32)
Calcium: 8.7 mg/dL — ABNORMAL LOW (ref 8.9–10.3)
Chloride: 104 mmol/L (ref 98–111)
Creatinine, Ser: 0.76 mg/dL (ref 0.44–1.00)
GFR, Estimated: >60 mL/min (ref >60)
Glucose, Bld: 110 mg/dL — ABNORMAL HIGH (ref 70–99)
Potassium: 3.9 mmol/L (ref 3.5–5.1)
Sodium: 137 mmol/L (ref 135–145)

## 2022-11-16 LAB — VITAMIN D 25 HYDROXY (VIT D DEFICIENCY, FRACTURES): Vit D, 25-Hydroxy: 30.77 ng/mL (ref 30–100)

## 2022-11-16 NOTE — Progress Notes (Signed)
Inpatient [facility] Individual Statement of Services  Patient Name:  Joy Shelton  Date:  11/16/2022  Welcome to the [facility].  Our goal is to provide you with an individualized program based on your diagnosis and situation, designed to meet your specific needs.  With this comprehensive rehabilitation program, you will be expected to participate in at least 3 hours of rehabilitation therapies Monday-Friday, with modified therapy programming on the weekends.  Your rehabilitation program will include the following services:  Physical Therapy (PT), Occupational Therapy (OT), Speech Therapy (ST), 24 hour per day rehabilitation nursing, Therapeutic Recreaction (TR), Neuropsychology, Care Coordinator, Rehabilitation Medicine, Nutrition Services, Pharmacy Services, and Other  Weekly team conferences will be held on Wednesdays to discuss your progress.  Your Inpatient Rehabilitation Care Coordinator will talk with you frequently to get your input and to update you on team discussions.  Team conferences with you and your family in attendance may also be held.  Expected length of stay: 5-7 Days  Overall anticipated outcome: Supervision/MOD I  Depending on your progress and recovery, your program may change. Your Inpatient Rehabilitation Care Coordinator will coordinate services and will keep you informed of any changes. Your Inpatient Rehabilitation Care Coordinator's name and contact numbers are listed  below.  The following services may also be recommended but are not provided by the [facility]:   [employer] will be made to provide these services after discharge if needed.  Arrangements include referral to agencies that provide these services.  Your insurance has been verified to be:   [employer] Your primary doctor is:  Harlan Stains, MD  Pertinent information  will be shared with your doctor and your insurance company.  Inpatient Rehabilitation Care Coordinator:  Erlene Quan, [place] or [phone]  Information discussed with and copy given to patient by: Dyanne Iha, 11/16/2022, 10:36 AM

## 2022-11-16 NOTE — Progress Notes (Signed)
Inpatient Rehabilitation  Patient information reviewed and entered into eRehab system by Kampbell Holaway Naseer Hearn, OTR/L, Rehab Quality Coordinator.   Information including medical coding, functional ability and quality indicators will be reviewed and updated through discharge.   

## 2022-11-16 NOTE — Progress Notes (Signed)
Pt placed self on home cpap machine. 

## 2022-11-16 NOTE — Progress Notes (Signed)
Occupational Therapy Session Note  Patient Details  Name: Joy Shelton MRN: [id number] Date of Birth: 1961/04/24  Today's Date: 11/16/2022 OT Individual Time: 0800-0900 & 1417-1530 OT Individual Time Calculation (min): 60 min & 73 min   Short Term Goals: Week 1:  OT Short Term Goal 1 (Week 1): STGs=LTGs due to patient's length of stay.  Skilled Therapeutic Interventions/Progress Updates:  Session 1 Skilled OT intervention completed with focus on toileting needs, dynamic balance, floor level retrieval of items, AE education, fine motor coordination assessment. Pt received upright in bed, agreeable to session. No pain reported during session.  Pt requested to defer shower to PM session. Completed bed mobility with mod I to EOB, supervision sit > stand and ambulatory transfer without AD to retrieve bra/shirt, then donned with mod I seated at EOB. Completed CGA ambulatory transfer into bathroom, then supervised toileting for continent void. CGA ambulation to sink, then supervision hand hygiene.   Ambulated with supervision without AD <> gym with no LOB.  Blocked practice sit > stands with emphasis on gluteal/quad activation during corn hole activity with supervision for sit > stands for each toss of the bean bag (>20 squats in total). Completed on R and L, with pt retrieving all bean bags at end of trial via squatting (with CGA) with education provided on use of reacher for accessing bean bags under the board due to pt fear of falling. Her fear does not inhibit mobility, but she reports feeling less confident than she did PTA with OT reviewing brain healing and common symptoms/side effects from stroke that can challenge her ADLs and mobility and associated confidence with them. Rest breaks needed throughout for mild SOB with fatigue.  Pt voiced how her R hand doesn't feel the same as her unaffected L hand. Completed 9 hole peg test to assess fine motor coordination between hands: L hand- 21.67  sec R hand- 20.61 sec -Results WFL  Back in room, pt remained seated in w/c with chair pad alarm on/activated, MD at pt's side for rounds and pt left with all needs in reach at end of session.  Session 2 Skilled OT intervention completed with focus on functional endurance and ADL retraining within a shower context, tub/shower transfers, and cardiovascular endurance. Pt received seated in w/c, agreeable to session. No pain reported.  Pt was able to complete sit > stands and ambulatory transfers without AD and supervision A.   Ambulated into bathroom, then supervision stand step transfer into shower. Doffed clothing at the sit > stand level with supervision. Able to bathe with overall distant supervision all at the sit > stand level with use of grab bars for standing balance.  Cues needed for drying feet off prior to standing/shower exit, then pt donned bra/shirt in stance with supervision. Pt donned pull up with supervision in stance using grab bar for UE support, however OT advised that pt complete LB dressing at sit > stand level for threading purposes and fall prevention especially with pt's history of falling with LB dressing in the past per report.  Ambulated to w/c with CGA due to out of shower. Then able to dress pull up, pants, socks and shoes all with supervision/set up A. Hair grooming in stance at sink.  Pt ambulated to ADL bathroom. Education provided on DME available such as tub bench or shower chair that can be purchased for use at home, suggested use of [facility] for ease of bathing, shower curtain management to prevent water spillage, minimizing stands via  lateral leans for pericare, use of grab bars/installation as well as effective safety strategies for exiting the shower to eliminate falls. Pt first practiced sit pivot transfer on TTB into shower with supervision on/off. Then practiced use of shower chair for side step in/out transfer at supervision level for less invasive DME. Cues  needed to avoid use of grab bars as pt will not have these at home. Sister is to provide OT a picture of her "soaker tub" and dimensions of the tub for better preparation on what DME pt will benefit from however has liking to the shower chair currently. Discussed wall mount for hand held nozzle.  Ambulated >300 ft to nustep in day room, then completed 10 mins on level 5 for cardiovascular endurance.  Ambulated back to room, then remained seated in w/c with chair pad alarm on and all needs in reach at end of session.   Therapy Documentation Precautions:  Precautions Precautions: Fall Restrictions Weight Bearing Restrictions: No    Therapy/Group: Individual Therapy  Blase Mess, MS, OTR/L  11/16/2022, 3:43 PM

## 2022-11-16 NOTE — Progress Notes (Signed)
Inpatient Rehabilitation Care Coordinator Assessment and Plan Patient Details  Name: Joy Shelton MRN: [id number] Date of Birth: 02-Dec-1960  Today's Date: 11/16/2022  Hospital Problems: Principal Problem:   Lacunar infarct, acute [facility])  Past Medical History:  Past Medical History:  Diagnosis Date   Hypercholesteremia    Past Surgical History: History reviewed. No pertinent surgical history. Social History:  reports that she has never smoked. She has never used smokeless tobacco. She reports that she does not currently use alcohol. She reports that she does not use drugs.  Family / Support Systems Other Supports: Robin Anticipated Caregiver: Father and 2 sisters (1 local and 1 out of town) Ability/Limitations of Caregiver: none; Father limtied due to age Caregiver Availability: 24/7 Family Dynamics: support from father and sisters  Social History Preferred language: English Religion:  Health Literacy - How often do you need to have someone help you when you read instructions, pamphlets, or other written material from your doctor or pharmacy?: Never Employment Status: Employed   Abuse/Neglect Abuse/Neglect Assessment Can Be Completed: Yes Physical Abuse: Denies Verbal Abuse: Denies Sexual Abuse: Denies Exploitation of patient/patient's resources: Denies Self-Neglect: Denies  Patient response to: Social Isolation - How often do you feel lonely or isolated from those around you?: Never  Emotional Status Recent Psychosocial Issues: coping  Patient / Family Perceptions, Expectations & Goals Premorbid pt/family roles/activities: independent and working Anticipated changes in roles/activities/participation: assistance from father (56) and sister from out of town. Sister in town spouse had a heart attack Pt/family expectations/goals: Supervision/MOD I  Recruitment consultant: None Premorbid Home Care/DME Agencies: Other (Comment) ([facility], [facility],  [facility]) Transportation available at discharge: sister able to transport Is the patient able to respond to transportation needs?: Yes In the past 12 months, has lack of transportation kept you from medical appointments or from getting medications?: No In the past 12 months, has lack of transportation kept you from meetings, work, or from getting things needed for daily living?: No Resource referrals recommended: Neuropsychology  Discharge Planning Living Arrangements: Alone Support Systems: Parent, Other relatives (Father and 2 sisters) Type of Residence: Private residence Insurance Resources: Multimedia programmer (specify) Financial Resources: Employment Museum/gallery curator Screen Referred: No Living Expenses: Rent Does the patient have any problems obtaining your medications?: No Home Management: Independent Patient/Family Preliminary Plans: Anticipates managing Care Coordinator Barriers to Discharge: Insurance for SNF coverage, Lack of/limited family support, Decreased caregiver support DC Planning Additional Notes/Comments: Planned sister to assist husband had a heart attack. Other sister coming to town to assist patient potentially. Expected length of stay: 5-7 Days  Clinical Impression SW met with patient, introduced self and explained role. Patient anticipates discharge home with her father with her 2 sisters to assist. Initially sister planned be primary caregiver, her spouse has had a heart attack. Patient has a sister who is out of town planning to travel here on Saturday to assist at discharge. Sister still plans to assist but is limited now due to needing to assist her spouse. No additional questions or concerns.  Dyanne Iha 11/16/2022, 12:58 PM

## 2022-11-16 NOTE — Progress Notes (Signed)
Met with patient. Oriented to rehab. Patient informed me of Team Conference every Wednesday. Will discuss goals, progress, barriers, discharge date and if any equipment needed. Also discussed importance of cholesterol control and that will be taking new medication for better control. Eats a lot of fried foods. Encourage to cut back on fried foods and condiments as well as processed foods. Also discussed stroke prophylaxis of ASA and plavix x 3 weeks and then will take asa alone. All questions and concerns addressed. All needs met, call bell in reach.

## 2022-11-16 NOTE — Plan of Care (Signed)
Nutrition Education Note  RD consulted for nutrition education regarding a Heart Healthy diet.   Pt reports that she was not following a diet PTA. She states that she would often only eat 2 meals per day. She states that she usually eats a salad for lunch and then will often eat fast food for dinner.   Lipid Panel     Component Value Date/Time   CHOL 208 (H) 11/09/2022 1255   TRIG 66 11/09/2022 1255   HDL 56 11/09/2022 1255   CHOLHDL 3.7 11/09/2022 1255   VLDL 13 11/09/2022 1255   LDLCALC 139 (H) 11/09/2022 1255    RD provided "Heart Healthy Nutrition Therapy" handout from the Academy of Nutrition and Dietetics. Reviewed patient's dietary recall. Provided examples on ways to decrease sodium and fat intake in diet. Discouraged intake of processed foods and use of salt shaker. Encouraged fresh fruits and vegetables as well as whole grain sources of carbohydrates to maximize fiber intake. Teach back method used.  Expect good compliance.  Body mass index is 35 kg/m. Pt meets criteria for Obesity based on current BMI.  Current diet order is Heart Healthy, patient is consuming approximately 100% of meals at this time. Labs and medications reviewed. No further nutrition interventions warranted at this time. RD contact information provided. If additional nutrition issues arise, please re-consult RD.  Thalia Bloodgood, RD, LDN, CNSC.

## 2022-11-16 NOTE — Progress Notes (Signed)
Physical Therapy Session Note  Patient Details  Name: Joy Shelton MRN: [id number] Date of Birth: [birth year]  Today's Date: 11/16/2022 PT Individual Time: 4403-4742 PT Individual Time Calculation (min): 70 min   Short Term Goals: Week 1:  PT Short Term Goal 1 (Week 1): STG=LTG due to LOS  Skilled Therapeutic Interventions/Progress Updates:   Received pt sitting in WC, pt agreeable to PT treatment, and denied any pain during session. Session with emphasis on functional mobility/transfers, dynamic standing balance/coordination, NMR, and gait training. Pt performed all transfers without AD and CGA/close supervision throughout session. Pt ambulated 164ft x 2 trials without AD and CGA to/from main therapy gym. Pt performed standing mini-squats 3x10 with red TB around thighs with 4.4lb medicine ball - emphasis on quad/glute strength. Pt worked on the following activities inside // bars with emphasis on dynamic standing balance/NMR: -monster squats with red TB around ankles x4 laps without UE support -high knee marching with red TB around thighs x4 laps without UE support  -forward/backward tandem walking inside // bars x4 laps each without UE support - pt with a few instances of LOB but able to self correct. -mini lunges on Bosu 2x10 bilaterally with BUE support fading to 1 UE support -static stance on Rockerboard for 1 minute without UE support -lateral taps on Rockerboard x20 with 1 UE support - with with increased L lateral LOB but able to self-correct -mini squats on Rockerboard 3x10 without UE support -lateral step ups onto 8in step x10 bilaterally with 1 UE support Pt then ambulated additional 1,181ft without AD and CGA fading to close supervision. Challenged pt with head turns L/R, looking up/down, sudden stops, 360 degree turns, and increasing gait speed. Pt with occasional unsteadiness, but no true LOB. Pt required multiple rest/water breaks throughout session. Concluded session with pt  sitting in [facility] with all needs within reach.   Therapy Documentation Precautions:  Precautions Precautions: Fall Restrictions Weight Bearing Restrictions: No  Therapy/Group: Individual Therapy Alfonse Alpers PT, DPT 11/16/2022, 6:57 AM

## 2022-11-16 NOTE — Progress Notes (Signed)
PROGRESS NOTE   Subjective/Complaints: Working with therapy this AM. Reports prior loose Bms but this is improving. No new concerns.   ROS: Denies CP ,SOB, + chronic pain behind left eye, +anxiety about having another stroke Objective:   No results found. No results for input(s): "WBC", "HGB", "HCT", "PLT" in the last 72 hours. Recent Labs    11/16/22 0005  NA 137  K 3.9  CL 104  CO2 23  GLUCOSE 110*  BUN 16  CREATININE 0.76  CALCIUM 8.7*    Intake/Output Summary (Last 24 hours) at 11/16/2022 0811 Last data filed at 11/16/2022 0700 Gross per 24 hour  Intake 954 ml  Output --  Net 954 ml         Physical Exam: Vital Signs Blood pressure 133/66, pulse 76, temperature 98.2 F (36.8 C), temperature source Oral, resp. rate 18, height 5\' 2"  (1.575 m), weight 86.8 kg, SpO2 98 %. Gen: no distress, normal appearing, BMI 35 HEENT: oral mucosa pink and moist, NCAT Cardio: Reg rate Chest: CTAB, normal effort, normal rate of breathing Abd: soft, non-distended, +BS Ext: no edema Psych: pleasant, normal affect Skin: intact Neurological:     Mental Status: She is alert.     Comments: Alert and oriented x 3. Normal insight and awareness. Intact Memory. Normal language and minimal dysarthria. Cranial nerve exam unremarkable. Motor 4+/5 L slightly greater than right. Sl decrease in [facility] R>L UE and LE to lesser extent. Was feeding self using right hand however. Sensory exam normal for light touch and pain in all 4 limbs. No limb ataxia or cerebellar signs. No abnormal tone appreciated.    Assessment/Plan: 1. Functional deficits which require 3+ hours per day of interdisciplinary therapy in a comprehensive inpatient rehab setting. Physiatrist is providing close team supervision and 24 hour management of active medical problems listed below. Physiatrist and rehab team continue to assess barriers to discharge/monitor patient progress  toward functional and medical goals  Care Tool:  Bathing    Body parts bathed by patient: Right arm, Left arm, Chest, Abdomen, Front perineal area, Buttocks, Right upper leg, Left upper leg, Right lower leg, Left lower leg, Face         Bathing assist Assist Level: Supervision/Verbal cueing     Upper Body Dressing/Undressing Upper body dressing   What is the patient wearing?: Pull over shirt    Upper body assist Assist Level: Supervision/Verbal cueing    Lower Body Dressing/Undressing Lower body dressing      What is the patient wearing?: Underwear/pull up, Pants     Lower body assist Assist for lower body dressing: Supervision/Verbal cueing     Toileting Toileting    Toileting assist Assist for toileting: Contact Guard/Touching assist     Transfers Chair/bed transfer  Transfers assist     Chair/bed transfer assist level: Contact Guard/Touching assist     Locomotion Ambulation   Ambulation assist      Assist level: Minimal Assistance - Patient > 75% Assistive device: No Device Max distance: 165ft   Walk 10 feet activity   Assist     Assist level: Minimal Assistance - Patient > 75% Assistive device: No Device  Walk 50 feet activity   Assist    Assist level: Minimal Assistance - Patient > 75% Assistive device: No Device    Walk 150 feet activity   Assist Walk 150 feet activity did not occur: Safety/medical concerns (fatigue)         Walk 10 feet on uneven surface  activity   Assist     Assist level: Minimal Assistance - Patient > 75%     Wheelchair     Assist Is the patient using a wheelchair?: Yes Type of Wheelchair: Manual    Wheelchair assist level: Dependent - Patient 0% Max wheelchair distance: >170ft    Wheelchair 50 feet with 2 turns activity    Assist        Assist Level: Dependent - Patient 0%   Wheelchair 150 feet activity     Assist      Assist Level: Dependent - Patient 0%    Blood pressure 133/66, pulse 76, temperature 98.2 F (36.8 C), temperature source Oral, resp. rate 18, height 5\' 2"  (1.575 m), weight 86.8 kg, SpO2 98 %.    Medical Problem List and Plan: 1. 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  Provided dietary education  2.  Antithrombotics: -DVT/anticoagulation:  Pharmaceutical: Lovenox             -antiplatelet therapy: Aspirin 81 mg and Plavix 75 mg for 3 weeks followed by aspirin 81 mg   3. Pain Management: Tylenol as needed             -headaches are very mild and appear to be only tension-type headaches in left temple area. 4. Mood/Behavior/Sleep: LCSW to evaluate and provide emotional support             -antipsychotic agents: n/a   5. Neuropsych/cognition: This patient is capable of making decisions on her own behalf.   6. Skin/Wound Care: Routine skin care checks   7. Fluids/Electrolytes/Nutrition: Routine I's and O's and follow-up chemistries             -Heart healthy diet   8: Hyperlipidemia: Continue Crestor 40 mg   9: Sleep apnea:  -continue CPAP mask at nighttime (if tolerates)             -screened for Sleep Smart study   10: Nausea: started on Protonix daily   11: Obesity: BMI = 34.75-->35; weight loss counseling, dietician consult ordered as per patient's request, provided dietary education.   12. Pt reports loose, more urgent stool             -likely d/t change in diet             -hold stool softeners             -add probiotic  -prn imodium ordered  -Improved, LBM  11/15/22 13. Left sided pain behind her eye, chronic, recommended calling ophthalmology for follow-up regarding her cataract. 14. Screening for vitamin D deficiency: check vitamin D level on Monday.  15. Hypotension: decrease Robaxin to q8H  prn -1/8 BP a little soft but appears stable     11/16/2022    3:58 AM 11/15/2022    8:05 PM 11/15/2022  5:38 PM  Vitals with BMI  Systolic 133 121 161  Diastolic 66 53 50  Pulse 76 76 79     LOS: 3 days A FACE TO FACE EVALUATION WAS PERFORMED  Fanny Dance 11/16/2022, 8:11 AM

## 2022-11-16 NOTE — IPOC Note (Signed)
Overall Plan of Care [facility]) Patient Details Name: Joy Shelton MRN: [id number] DOB: 1961-08-09  Admitting Diagnosis: Lacunar infarct, acute [facility])  Hospital Problems: Principal Problem:   Lacunar infarct, acute ([place])     Functional Problem List: Nursing Perception, Safety, Endurance, Medication Management  PT Balance, Endurance, Motor, Nutrition, Sensory  OT Balance, Endurance, Motor  SLP    TR         Basic ADL's: OT Bathing, Dressing, Toileting     Advanced  ADL's: OT Simple Meal Preparation, Laundry, Light Housekeeping     Transfers: PT Bed Mobility, Bed to Chair, Car, Manufacturing systems engineer, Metallurgist: PT Ambulation, Emergency planning/management officer, Stairs     Additional Impairments: OT    SLP        TR      Anticipated Outcomes Item Anticipated Outcome  Self Feeding    Swallowing      Basic self-care  Mod I  Toileting  Mod I   Bathroom Transfers Mod I  Bowel/Bladder  continent x 2  Transfers  Mod I  Locomotion  Mod I  Communication     Cognition     Pain  less than 3  Safety/Judgment  remain fall free while in rehab   Therapy Plan: PT Intensity: Minimum of 1-2 x/day ,45 to 90 minutes PT Frequency: 5 out of 7 days PT Duration Estimated Length of Stay: 5-7 days OT Intensity: Minimum of 1-2 x/day, 45 to 90 minutes OT Frequency: 5 out of 7 days OT Duration/Estimated Length of Stay: 5-7 days     Team Interventions: Nursing Interventions Patient/Family Education, Disease Management/Prevention, Discharge Planning, Medication Management  PT interventions Ambulation/gait training, Community reintegration, DME/adaptive equipment instruction, Neuromuscular re-education, Psychosocial support, Stair training, UE/LE Strength taining/ROM, Training and development officer, Discharge planning, Pain management, Therapeutic Activities, UE/LE Coordination activities, Cognitive remediation/compensation, Disease management/prevention, Functional mobility  training, Patient/family education, Therapeutic Exercise, Visual/perceptual remediation/compensation  OT Interventions Balance/vestibular training, Discharge planning, DME/adaptive equipment instruction, Functional mobility training, Neuromuscular re-education, Patient/family education, Psychosocial support, Self Care/advanced ADL retraining, Therapeutic Activities, Therapeutic Exercise, UE/LE Strength taining/ROM, UE/LE Coordination activities  SLP Interventions    TR Interventions    SW/CM Interventions Discharge Planning, Psychosocial Support, Patient/Family Education, Disease Management/Prevention   Barriers to Discharge MD  Medical stability, Home enviroment access/loayout, and Weight  Nursing Decreased caregiver support, Home environment access/layout, Weight 1 level with 1 step no rails  PT Decreased caregiver support, Other (comments) lives alone, decreased balance/coordination  OT Lack of/limited family support    SLP      SW Insurance for SNF coverage, Lack of/limited family support, Decreased caregiver support     Team Discharge Planning: Destination: PT-Home ,OT- Home , SLP-  Projected Follow-up: PT-Outpatient PT, OT-  Outpatient OT, SLP-  Projected Equipment Needs: PT-To be determined, OT- To be determined, SLP-  Equipment Details: PT-has RW and SPC that were her dad's, OT-  Patient/family involved in discharge planning: PT- Patient,  OT-Patient, SLP-   MD ELOS: 5-7 Medical Rehab Prognosis:  Excellent Assessment: The patient has been admitted for CIR therapies with the diagnosis of acute lacunar infarctions in the central pons left greater than right. The team will be addressing functional mobility, strength, stamina, balance, safety, adaptive techniques and equipment, self-care, bowel and bladder mgt, patient and caregiver education. Goals have been set at mod I. Anticipated discharge destination is home.        See Team Conference Notes for weekly updates to the plan  of care

## 2022-11-17 DIAGNOSIS — I6381 Other cerebral infarction due to occlusion or stenosis of small artery: Secondary | ICD-10-CM | POA: Diagnosis not present

## 2022-11-17 MED ORDER — VITAMIN D (ERGOCALCIFEROL) 1.25 MG (50000 UNIT) PO CAPS: 50000.0000 [IU] | ORAL_CAPSULE | ORAL | Status: DC

## 2022-11-17 MED ORDER — VITAMIN D (ERGOCALCIFEROL) 1.25 MG (50000 UNIT) PO CAPS
50000.0000 [IU] | ORAL_CAPSULE | ORAL | Status: DC
  Administered 2022-11-18: 50000 [IU] via ORAL
  Filled 2022-11-17: qty 1

## 2022-11-17 MED ORDER — DOCUSATE SODIUM 100 MG PO CAPS
100.0000 mg | ORAL_CAPSULE | Freq: Every day | ORAL | Status: DC
  Administered 2022-11-17 – 2022-11-21 (×5): 100 mg via ORAL
  Filled 2022-11-17 (×4): qty 1

## 2022-11-17 MED ORDER — CALCIUM CARBONATE ANTACID 500 MG PO CHEW
500.0000 mg | CHEWABLE_TABLET | Freq: Every day | ORAL | Status: DC
  Administered 2022-11-17 – 2022-11-20 (×4): 500 mg via ORAL
  Filled 2022-11-17 (×4): qty 3

## 2022-11-17 MED ORDER — METHOCARBAMOL 500 MG PO TABS: 500.0000 mg | ORAL_TABLET | Freq: Two times a day (BID) | ORAL | Status: DC | PRN

## 2022-11-17 MED ORDER — MAGNESIUM GLUCONATE 500 MG PO TABS
250.0000 mg | ORAL_TABLET | Freq: Every day | ORAL | Status: DC
  Administered 2022-11-17 – 2022-11-20 (×4): 250 mg via ORAL
  Filled 2022-11-17 (×4): qty 1

## 2022-11-17 NOTE — Progress Notes (Signed)
Physical Therapy Session Note  Patient Details  Name: Joy Shelton MRN: [id number] Date of Birth: 14-Sep-1961  Today's Date: 11/17/2022 PT Individual Time: 0900-1010 PT Individual Time Calculation (min): 70 min   Short Term Goals: Week 1:  PT Short Term Goal 1 (Week 1): STG=LTG due to LOS  Skilled Therapeutic Interventions/Progress Updates:   Received pt ambulating in hallway with PT - this therapist took over with care. Pt agreeable to PT treatment and denied any pain during session. Session with emphasis on functional mobility/transfers, generalized strengthening and endurance, dynamic standing balance/coordination, NMR, and gait training. Pt performed all transfers without AD and supervision throughout session. Pt ambulated 161ft x 2 trials without AD and supervision to dayroom and performed standing BLE strengthening on Kinetron at 20 cm/sec for 1 minute x 4 trials with BUE support and emphasis on cardiovascular endurance. Pt then performed the following activities in agility ladder with emphasis on dynamic standing balance/coordination with min A overall for balance: -forward high knee marching x 4 laps  -lateral side stepping x 4 laps bilaterally - cues to keep gaze straight ahead -forward stepping with lateral taps to cones x 4 laps Pt then ambulated 567ft without AD and CGA - challenged pt with dual tasking tossing and catching ball, increasing speed, and naming various categories (animals & seasons) - MD arrived for morning rounds during seated rest break. Discussed fall recovery and practiced floor transfer with supervision overall. Educated pt in case of a fall to #1 ensure she is not injured and did not hit her head and if she did, then call 911 and do not attempt to get up #2 roll onto her side #3 get onto hands and knees and crawl to stable piece of furniture #4 place hands up and get feet underneath her #5 semi-stand and pivot to sit EOM. Pt then transitioned in/out of tall  kneeling/quadruped and performed the following activities in quadruped with emphasis on stability and coordination: -alternating leg lifts x10 bilaterally  -alternating arm lifts x10 bilaterally  -birddog 2x5 bilaterally  Concluded session with pt sitting in WC with all needs within reach. RN notified of pt's request for headache medicine.   Therapy Documentation Precautions:  Precautions Precautions: Fall Restrictions Weight Bearing Restrictions: No  Therapy/Group: Individual Therapy Alfonse Alpers PT, DPT  11/17/2022, 7:12 AM

## 2022-11-17 NOTE — Progress Notes (Signed)
Occupational Therapy Session Note  Patient Details  Name: Joy Shelton MRN: [id number] Date of Birth: 17-Jun-1961  {CHL IP REHAB OT TIME CALCULATIONS:[id number]}   Short Term Goals: Week 1:  OT Short Term Goal 1 (Week 1): STGs=LTGs due to patient's length of stay.  Skilled Therapeutic Interventions/Progress Updates:  Pt received *** for skilled OT session with focus on ***. Pt agreeable to interventions, demonstrating overall *** mood. Pt reported ***/10 pain, stating "***" in reference to ***. OT offering intermediate rest breaks and positioning suggestions throughout session to address pain/fatigue and maximize participation/safety in session.    Pt remained *** with all immediate needs met at end of session. Pt continues to be appropriate for skilled OT intervention to promote further functional independence.   Therapy Documentation Precautions:  Precautions Precautions: Fall Restrictions Weight Bearing Restrictions: No    Therapy/Group: Individual Therapy  Maudie Mercury, OTR/L, MSOT  11/17/2022, 10:50 PM

## 2022-11-17 NOTE — Progress Notes (Signed)
Patient ID: Joy Shelton, female   DOB: 1961/09/17, 62 y.o.   MRN: [id number]  Sw met with patient, no questions or concerns.

## 2022-11-17 NOTE — Progress Notes (Signed)
Physical Therapy Session Note  Patient Details  Name: Joy Shelton MRN: [id number] Date of Birth: [birth year]  Today's Date: 11/17/2022 PT Individual Time: 1335-1430 PT Individual Time Calculation (min): 55 min   Short Term Goals: Week 1:  PT Short Term Goal 1 (Week 1): STG=LTG due to LOS  Skilled Therapeutic Interventions/Progress Updates: Pt presented in bathroom with sister present agreeable to therapy. Pt denies pain during session. Per discussion with OT trialing rollator for longer distances and integration in community setting Pt ambulated >1056ft with supervision in/out elevators, through atrium, [place] entrance, gift shop, and to main 2nd fl entrance. Pt was able to safely navigate smaller and large spaces as well as crowded spaces safely and without rest break. Upon return to unit pt participated in dynamic balance activities including use of agility ladder. Pt able to perform forward/backwards zig-zags initially requiring increased (steps) to reorient to balance but was able to correct with verbal cues and was able to demosntrate improved coordination with trials. Pt also performed in/outs with pt not showing any ankle instability with activity. Pt also performed Sit to stand while standing on balance board then progressed to Sit to stand while standing on foam balance beam. Progressed Sit to stand to incorporate 1Kg weighted ball. Pt then also used rebounder while standing on balance beam x 15 then transitioned to standard Airex and performed standing throws with rotation L/R x 15 each. Participated in step ups while standing on Airex to 6in step x 10 forward alternating then side steps x 10 L/R. After brief rest pt ambulated back to room with rollator distant supervision and returned to w/c at end of session with call bell within reach and needs met.      Therapy Documentation Precautions:  Precautions Precautions: Fall Restrictions Weight Bearing Restrictions: No General:   Vital  Signs: Therapy Vitals Temp: 98.4 F (36.9 C) Temp Source: Oral Pulse Rate: 75 Resp: 15 BP: (!) 116/59 Patient Position (if appropriate): Sitting Oxygen Therapy SpO2: 98 % O2 Device: Room Air Pain: Pain Assessment Pain Scale: 0-10 Pain Score: 0-No pain Mobility:   Locomotion :    Trunk/Postural Assessment :    Balance:   Exercises:   Other Treatments:      Therapy/Group: Individual Therapy  Cristoval Teall 11/17/2022, 3:55 PM

## 2022-11-17 NOTE — Progress Notes (Signed)
Occupational Therapy Session Note  Patient Details  Name: Joy Shelton MRN: [id number] Date of Birth: 09-Feb-1961  Today's Date: 11/17/2022 OT Individual Time: 1120-1200 OT Individual Time Calculation (min): 40 min    Short Term Goals: Week 1:  OT Short Term Goal 1 (Week 1): STGs=LTGs due to patient's length of stay.  Skilled Therapeutic Interventions/Progress Updates:  Skilled OT intervention completed with focus on DC planning, ambulatory endurance, with introduction to rollator for community level mobility. Pt received seated in w/c with sister present and pt agreeable to session. No pain reported.  Pt declined self care needs at this time. Pt reports she didn't have family measure her tubs at home, but requested this OT to provide her sister a form for what to measure specifically to determine appropriate DME needs. Home measurement sheet provided for tub threshold and base of tub floor width/length measurements. Discussed differences of shower chair and TTB with pt's sister.  Pt also reported she wants to walk around her neighborhood for exercise but does not feel ready or comfortable to do that without AD. Discussed recommendations of walking with someone for first couple of times, with intentional plan of having park benches or seated options for fatigue if needed. Pt requested to try using RW for mobility, however OT educated on RW purpose and pt's > level of mobility than a RW. Did introduce option of SPC or rollator for longer distances.  Pt completed all sit > stands and ambulatory transfers without (then with rollator) all with supervision and no LOB. Pt ambulated to [employer]	[place], for OT to retrieve rollator. OT adjusted height of the hand rests for proper positioning for mobility. Education provided on break system as well as the seat feature for energy conservation or fatigue and safety mechanisms to do so with pt able to do demo turning around, sitting and standing with  supervision. Ambulated with rollator including multiple turns from [place] tower back to room with mod I.   Discussed options for use of this device for larger distances or grocery shopping and other IADLs however to continue progressing her balance as to not rely on the AD as it is more for confidence at this stage.  Pt remained seated in w/c, with chair pad alarm on/activated, and with all needs in reach at end of session.   Therapy Documentation Precautions:  Precautions Precautions: Fall Restrictions Weight Bearing Restrictions: No    Therapy/Group: Individual Therapy  Blase Mess, MS, OTR/L  11/17/2022, 12:18 PM

## 2022-11-17 NOTE — Progress Notes (Signed)
Physical Therapy Session Note  Patient Details  Name: Joy Shelton MRN: [id number] Date of Birth: June 20, 1961  Today's Date: 11/17/2022 PT Individual Time: 0844-0900 PT Individual Time Calculation (min): 16 min   Short Term Goals: Week 1:  PT Short Term Goal 1 (Week 1): STG=LTG due to LOS  Skilled Therapeutic Interventions/Progress Updates:      Therapy Documentation Precautions:  Precautions Precautions: Fall Restrictions Weight Bearing Restrictions: No  Pt agreeable to make up missed minutes of physical therapy and declines pain. Pt close (S) for supine to sit, STS ( no AD) and gait within room (no AD). Pt able to perform ipsilateral and contralateral reaching in drawers to retrieve clothing. Pt performed upper/lower body dressing in sitting with distant (S). Pt requires (S) for dynamic standing balance while performing oral care at sink. Pt agreeable to work on backward walking and requires CGA 10 ft x 2 with verbal cues for foot clearance. PT left in care of primary PT.     Therapy/Group: Individual Therapy  Verl Dicker Verl Dicker PT, DPT  11/17/2022, 12:08 PM

## 2022-11-17 NOTE — Progress Notes (Signed)
Pt reports intermittent tingling of hands with muscle spasms. Reports anxiety however states "I can't tell whether these episodes are related to panic attacks or if there is really something wrong". Pt able to move all extremities without difficulty, speech clear, ambulates to bathroom without difficulty. NIHSS-0. Pt assessed by charge nurse, Iona Beard, RN as well with no deficits noted. Pt tearful at times during discussion with this Probation officer. VSS. Denies pain. Linna Hoff, PA on unit and made aware. Pt reassessed throughout the night with no issues noted.

## 2022-11-17 NOTE — Progress Notes (Signed)
PROGRESS NOTE   Subjective/Complaints: She has tingling and numbness intermittently, no new weakness Continues to have chronic pain behind left eye  ROS: + chronic pain behind left eye- improved, +anxiety about having another stroke, +tingling and numbness Objective:   No results found. Recent Labs    11/16/22 0757  WBC 9.1  HGB 14.3  HCT 42.1  PLT 261   Recent Labs    11/16/22 0005  NA 137  K 3.9  CL 104  CO2 23  GLUCOSE 110*  BUN 16  CREATININE 0.76  CALCIUM 8.7*    Intake/Output Summary (Last 24 hours) at 11/17/2022 1352 Last data filed at 11/17/2022 1200 Gross per 24 hour  Intake 532 ml  Output --  Net 532 ml        Physical Exam: Vital Signs Blood pressure (!) 116/59, pulse 75, temperature 98.4 F (36.9 C), temperature source Oral, resp. rate 15, height 5\' 2"  (1.575 m), weight 86.8 kg, SpO2 98 %. Gen: no distress, normal appearing, BMI 35.00 HEENT: oral mucosa pink and moist, NCAT Cardio: Reg rate Chest: normal effort, normal rate of breathing Abd: soft, non-distended Ext: no edema Psych: pleasant, normal affect Skin: intact Neurological:     Mental Status: She is alert.     Comments: Alert and oriented x 3. Normal insight and awareness. Intact Memory. Normal language and minimal dysarthria. Cranial nerve exam unremarkable. Motor 4+/5 L slightly greater than right. Sl decrease in [facility] R>L UE and LE to lesser extent. Was feeding self using right hand however. Sensory exam normal for light touch and pain in all 4 limbs. No limb ataxia or cerebellar signs. No abnormal tone appreciated.  CG for foot clearance   Assessment/Plan: 1. Functional deficits which require 3+ hours per day of interdisciplinary therapy in a comprehensive inpatient rehab setting. Physiatrist is providing close team supervision and 24 hour management of active medical problems listed below. Physiatrist and rehab team continue to  assess barriers to discharge/monitor patient progress toward functional and medical goals  Care Tool:  Bathing    Body parts bathed by patient: Right arm, Left arm, Chest, Abdomen, Front perineal area, Buttocks, Right upper leg, Left upper leg, Right lower leg, Left lower leg, Face         Bathing assist Assist Level: Supervision/Verbal cueing     Upper Body Dressing/Undressing Upper body dressing   What is the patient wearing?: Pull over shirt    Upper body assist Assist Level: Set up assist    Lower Body Dressing/Undressing Lower body dressing      What is the patient wearing?: Underwear/pull up, Pants     Lower body assist Assist for lower body dressing: Supervision/Verbal cueing     Toileting Toileting    Toileting assist Assist for toileting: Independent     Transfers Chair/bed transfer  Transfers assist     Chair/bed transfer assist level: Supervision/Verbal cueing     Locomotion Ambulation   Ambulation assist      Assist level: Supervision/Verbal cueing Assistive device: No Device Max distance: 160ft   Walk 10 feet activity   Assist     Assist level: Supervision/Verbal cueing Assistive device: No Device  Walk 50 feet activity   Assist    Assist level: Supervision/Verbal cueing Assistive device: No Device    Walk 150 feet activity   Assist Walk 150 feet activity did not occur: Safety/medical concerns (fatigue)  Assist level: Supervision/Verbal cueing Assistive device: No Device    Walk 10 feet on uneven surface  activity   Assist     Assist level: Minimal Assistance - Patient > 75%     Wheelchair     Assist Is the patient using a wheelchair?: Yes Type of Wheelchair: Manual    Wheelchair assist level: Dependent - Patient 0% Max wheelchair distance: >1102ft    Wheelchair 50 feet with 2 turns activity    Assist        Assist Level: Dependent - Patient 0%   Wheelchair 150 feet activity      Assist      Assist Level: Dependent - Patient 0%   Blood pressure (!) 116/59, pulse 75, temperature 98.4 F (36.9 C), temperature source Oral, resp. rate 15, height 5\' 2"  (1.575 m), weight 86.8 kg, SpO2 98 %.    Medical Problem List and Plan: 1. 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  Provided dietary education  Discussed symptoms she should look out for that would be concerning for new stroke.  2.  Antithrombotics: -DVT/anticoagulation:  Pharmaceutical: Lovenox             -antiplatelet therapy: Aspirin 81 mg and Plavix 75 mg for 3 weeks followed by aspirin 81 mg   3. Pain Management: Tylenol as needed             -headaches are very mild and appear to be only tension-type headaches in left temple area. 4. Mood/Behavior/Sleep: LCSW to evaluate and provide emotional support             -antipsychotic agents: n/a   5. Neuropsych/cognition: This patient is capable of making decisions on her own behalf.   6. Skin/Wound Care: Routine skin care checks   7. Fluids/Electrolytes/Nutrition: Routine I's and O's and follow-up chemistries             -Heart healthy diet   8: Hyperlipidemia: Continue Crestor 40 mg   9: Sleep apnea:  -continue CPAP mask at nighttime (if tolerates)             -screened for Sleep Smart study   10: Nausea: started on Protonix daily   11: Obesity: BMI = 34.75-->35; weight loss counseling, dietician consult ordered as per patient's request, provided dietary education.  12. Pt reports loose, more urgent stool             -likely d/t change in diet             -hold stool softeners             -add probiotic  -prn imodium ordered 13. Left sided pain behind her eye, chronic, recommended calling ophthalmology for follow-up regarding her  cataract. 14. Suboptimatl vitamin D: start ergocalciferol 50,000U once per week for 7 weeks.  15. Hypotension: decrease Robaxin to q12H prn 16. Hypocalcemia: add supplement after dinner  LOS: 4 days A FACE TO FACE EVALUATION WAS PERFORMED  Clint Bolder P Tyrena Gohr 11/17/2022, 1:52 PM

## 2022-11-18 DIAGNOSIS — I6381 Other cerebral infarction due to occlusion or stenosis of small artery: Secondary | ICD-10-CM | POA: Diagnosis not present

## 2022-11-18 MED ORDER — METHOCARBAMOL 500 MG PO TABS: 500.0000 mg | ORAL_TABLET | Freq: Every day | ORAL | Status: DC | PRN

## 2022-11-18 MED ORDER — WITCH HAZEL-GLYCERIN EX PADS: MEDICATED_PAD | CUTANEOUS | Status: DC | PRN

## 2022-11-18 NOTE — Progress Notes (Signed)
Physical Therapy Session Note  Patient Details  Name: Joy Shelton MRN: [id number] Date of Birth: [birth year]  Today's Date: 11/18/2022 PT Individual Time: [phone] and 1300-1356 PT Individual Time Calculation (min): 71 min and 56 min  Short Term Goals: Week 1:  PT Short Term Goal 1 (Week 1): STG=LTG due to LOS  Skilled Therapeutic Interventions/Progress Updates:   Treatment Session 1 Received pt sitting in WC. Session with emphasis on functional mobility/transfers, toileting, generalized strengthening and endurance, dynamic standing balance/coordination, NMR, and gait training. Discussed using rollator in therapy yesterday. Pt requesting to use rollator for longer community distances and to use initially upon returning to walking in neighborhood to be able to safely return to activities she enjoys - notified CSW. However, plan to challenge pt without rollator during therapy sessions. Pt performed all transfers without AD and supervision throughout session. Pt reported pain from constipation despite receiving prune juice and medications - updated MD of concerns of possible hemorrhoids. Pt ambulated in/out of bathroom without AD and supervision and able to perform all toileting tasks without assist - pt continent of bladder and bowel (RN aware). Stood at sink and performed Comptroller with distant supervision, then RN arrived to administer medications. Pt ambulated >231ft without AD and supervision to main therapy gym. Pt then performed the following activities inside // bars with emphasis on dynamic standing balance/coordination and LE strength: -mini squats on Bosu 2x10 with light BUE support -lunges on Bosu 2x10 bilaterally with bilateral fingertip support. -single leg eccentric heel taps from 4in step 2x10 bilaterally with bilateral fingertip support -forward alternating step ups on 4in step with alternate high knee march 2x10 bilaterally with BUE support fading to no UE support.   -eccentric heel raises off 4in step 3x10 with 1 UE support  -standing gastroc stretch 2x15 second hold bilaterally -squats with 6lb medicine ball 3x12 Pt ambulated 137ft without AD and supervision back to room. Concluded session with pt sitting in [facility] with all needs within reach.   Treatment Session 2 Received pt sitting in WC, pt agreeable to PT treatment, and denied any pain during session. Session with emphasis on functional mobility/transfers, generalized strengthening and endurance, dynamic standing balance/coordination, stair navigation, and gait training. Pt performed all transfers without AD and supervision throughout session. Pt with questions regarding return to work and return to driving - notified MD. Pt ambulated 127ft without AD and supervision to [place]. Pt navigated up/down 2 flights of steps (~22 steps) with R handrail and supervision to simulate stairs at her dad's house. Pt ascended with a step through and descended with a step to pattern for safety.   Worked on [employer] social research officer and pt ambulated from gym on 4W downstairs to giftshop including getting on/off elevator, navigating through atrium, and negotiating tight turns without AD and supervision (~1,071ft). In giftshop, practiced gathering items from various shelf heights, carrying them while walking around giftshop, and then putting them back in their original places. Then ambulated around [place] and outside of entrance to [facility] to get sunshine without any rest break - emphasis on ambulating over uneven surfaces and up/down minor slopes. Pt then ambulated from giftshop to main entrance of hospital on 2nd floor >1,537ft without AD and supervision and up/down additional flight of steps. Took only 1 seated rest break at main entrance, then ambulated all the way back to 4W dayroom - emphasis on endurance, consistent gait speed, and directions.   Pt then performed BUE/LE strengthening on Nustep at workload 6 for 8 minutes  for a  total of 414 steps with emphasis on cardiovascular endurance with steps per minute >50. Pt ambulated in/out of bathroom (~86ft) without AD and supervision and able to perform all toileting tasks and void without assistance. Ambulated 159ft without AD and supervision back to room and concluded session with pt sitting in [facility] with all needs within reach and sister present at bedside.   Therapy Documentation Precautions:  Precautions Precautions: Fall Restrictions Weight Bearing Restrictions: No  Therapy/Group: Individual Therapy Alfonse Alpers PT, DPT  11/18/2022, 6:56 AM

## 2022-11-18 NOTE — Patient Care Conference (Signed)
Inpatient RehabilitationTeam Conference and Plan of Care Update Date: 11/18/2022   Time: 11:33 AM    Patient Name: Joy Shelton      Medical Record Number: [id number]  Date of Birth: 1961/08/06 Sex: Female         Room/Bed: 4W26C/4W26C-01 Payor Info: Payor: Theme park manager / Plan: [place] / Product Type: *No Product type* /    Admit Date/Time:  11/13/2022  4:03 PM  Primary Diagnosis:  Lacunar infarct, acute [facility])  Hospital Problems: Principal Problem:   Lacunar infarct, acute [facility])    Expected Discharge Date: Expected Discharge Date: 11/21/22  Team Members Present: Physician leading conference: Dr. Leeroy Cha Social Worker Present: Erlene Quan, BSW Nurse Present: Dorien Chihuahua, Wallene Huh, RN PT Present: Becky Sax, PT OT Present: Jennefer Bravo, OT     Current Status/Progress Goal Weekly Team Focus  Bowel/Bladder   Continent of B/B. LBM 11/15/22   Remain continent   Assist with Toilet needs    Swallow/Nutrition/ Hydration               ADL's   Supervision to CGA bathing, dressing and toileting   mod I   higher level balance, endurance, strengthening, DME edu and DC planning    Mobility   bed mobility supervision, transfers without AD CGA, gait 162ft without AD CGA, 12 steps 2 rails CGA   Mod I  functional mobility, generalized strengthening and endurance, NMR, balance training, endurance, D/C planning    Communication                Safety/Cognition/ Behavioral Observations               Pain   Denies pain   Remain pain free   Assess Q4 and prn    Skin   Intact   Prevent new breakdown  Assess QS and prn      Discharge Planning:  Discharging home with father and 2 sisters to assist. Sister who was primary caregiver is unable to provide 24/7 due to spouse recently having heart attack. Other sister plans to travel to [facility] Saturday to assist. Father able to assist some but not much physically.   Team  Discussion: Acute Lacunar infarct. Continent B/B. Denies pain. Skin is CDI. Has chronic pain behind left eye. Uses CPAP at HS. B/P soft. Weaning some medications. Constipation is resolving. Order witch hazel for hemorrhoids. Provided additional diet education. Therapies going very well. Will be supervision/Mod I with ADLs. Ambulating without device. Wants to walk in neighborhood when returning home. May use assistive device but not to rely on device.  Patient on target to meet rehab goals: yes, patient reaching MOD I goals with gait over 180 ft without device and 12 steps with 2 rails.   *See Care Plan and progress notes for long and short-term goals.   Revisions to Treatment Plan:  Mediation adjustments, monitor labs, monitor B/P  Teaching Needs: Medications, safety, gait/transfer training, diet education, B/P record  Current Barriers to Discharge: Decreased caregiver support and Home enviroment access/layout  Possible Resolutions to Barriers: Family education, nursing education, order recommended DME     Medical Summary Current Status: pain behind left eye is chronic, OSA, morbid obesity, anxiety, numbness and tingling, hypotension, constipation, hemorrhoid  Barriers to Discharge: Uncontrolled Pain;Morbid Obesity;Medical stability;Behavior/Mood  Barriers to Discharge Comments: pain behind left eye is chronic, OSA, morbid obesity, anxiety, numbness and tingling, hypotension, constipation, hemorrhoid Possible Resolutions to [employer] Focus: discussed symptoms of stroke that she should  be concerned about, recommended outpatient ophthalmology follow-up regarding cataract, continue OSA, started magnesium, started colace, witch hazel pads   Continued Need for Acute Rehabilitation Level of Care: The patient requires daily medical management by a physician with specialized training in physical medicine and rehabilitation for the following reasons: Direction of a multidisciplinary physical  rehabilitation program to maximize functional independence : Yes Medical management of patient stability for increased activity during participation in an intensive rehabilitation regime.: Yes Analysis of laboratory values and/or radiology reports with any subsequent need for medication adjustment and/or medical intervention. : Yes   I attest that I was present, lead the team conference, and concur with the assessment and plan of the team.   Ernest Pine 11/18/2022, 11:37 AM

## 2022-11-18 NOTE — Progress Notes (Signed)
Patient ID: Joy Shelton, female   DOB: 12-15-1960, 62 y.o.   MRN: [id number]  Team Conference Report to Patient/Family  Team Conference discussion was reviewed with the patient and caregiver, including goals, any changes in plan of care and target discharge date.  Patient and caregiver express understanding and are in agreement.  The patient has a target discharge date of 11/21/22.   Sw met with patient and provided team conference updates. Patient happy to discharge Saturday since sister will be present. Patient agrees to rollator and would prefer the shower bench. Patient will have transportation for OP therapies. No additional questions or concerns.  Joy Shelton 11/18/2022, 2:29 PM

## 2022-11-18 NOTE — Progress Notes (Signed)
PROGRESS NOTE   Subjective/Complaints: Was able to have a BM this AM after taking prune juice- said HS RN was great! Prior to this was having pain when trying to have a BM  ROS: + chronic pain behind left eye- improved, +anxiety about having another stroke, +tingling and numbness, +constipation Objective:   No results found. Recent Labs    11/16/22 0757  WBC 9.1  HGB 14.3  HCT 42.1  PLT 261   Recent Labs    11/16/22 0005  NA 137  K 3.9  CL 104  CO2 23  GLUCOSE 110*  BUN 16  CREATININE 0.76  CALCIUM 8.7*    Intake/Output Summary (Last 24 hours) at 11/18/2022 1024 Last data filed at 11/18/2022 0555 Gross per 24 hour  Intake 952 ml  Output --  Net 952 ml        Physical Exam: Vital Signs Blood pressure (!) 109/49, pulse 72, temperature 98.4 F (36.9 C), temperature source Oral, resp. rate 20, height 5\' 2"  (1.575 m), weight 86.8 kg, SpO2 96 %. Gen: no distress, normal appearing, BMI 35.00 HEENT: oral mucosa pink and moist, NCAT Cardio: Reg rate Chest: normal effort, normal rate of breathing Abd: soft, non-distended Ext: no edema Psych: pleasant, normal affect Skin: intact Neurological:     Mental Status: She is alert.     Comments: Alert and oriented x 3. Normal insight and awareness. Intact Memory. Normal language and minimal dysarthria. Cranial nerve exam unremarkable. Motor 4+/5 L slightly greater than right. Sl decrease in [facility] R>L UE and LE to lesser extent. Was feeding self using right hand however. Sensory exam normal for light touch and pain in all 4 limbs. No limb ataxia or cerebellar signs. No abnormal tone appreciated.  CG for foot clearance Ambulating supervision with rollator   Assessment/Plan: 1. Functional deficits which require 3+ hours per day of interdisciplinary therapy in a comprehensive inpatient rehab setting. Physiatrist is providing close team supervision and 24 hour management of  active medical problems listed below. Physiatrist and rehab team continue to assess barriers to discharge/monitor patient progress toward functional and medical goals  Care Tool:  Bathing    Body parts bathed by patient: Right arm, Left arm, Chest, Abdomen, Front perineal area, Buttocks, Right upper leg, Left upper leg, Right lower leg, Left lower leg, Face         Bathing assist Assist Level: Supervision/Verbal cueing     Upper Body Dressing/Undressing Upper body dressing   What is the patient wearing?: Pull over shirt    Upper body assist Assist Level: Set up assist    Lower Body Dressing/Undressing Lower body dressing      What is the patient wearing?: Underwear/pull up, Pants     Lower body assist Assist for lower body dressing: Supervision/Verbal cueing     Toileting Toileting    Toileting assist Assist for toileting: Independent     Transfers Chair/bed transfer  Transfers assist     Chair/bed transfer assist level: Supervision/Verbal cueing     Locomotion Ambulation   Ambulation assist      Assist level: Supervision/Verbal cueing Assistive device: No Device Max distance: 123ft   Walk 10  feet activity   Assist     Assist level: Supervision/Verbal cueing Assistive device: No Device   Walk 50 feet activity   Assist    Assist level: Supervision/Verbal cueing Assistive device: No Device    Walk 150 feet activity   Assist Walk 150 feet activity did not occur: Safety/medical concerns (fatigue)  Assist level: Supervision/Verbal cueing Assistive device: No Device    Walk 10 feet on uneven surface  activity   Assist     Assist level: Minimal Assistance - Patient > 75%     Wheelchair     Assist Is the patient using a wheelchair?: Yes Type of Wheelchair: Manual    Wheelchair assist level: Dependent - Patient 0% Max wheelchair distance: >173ft    Wheelchair 50 feet with 2 turns activity    Assist         Assist Level: Dependent - Patient 0%   Wheelchair 150 feet activity     Assist      Assist Level: Dependent - Patient 0%   Blood pressure (!) 109/49, pulse 72, temperature 98.4 F (36.9 C), temperature source Oral, resp. rate 20, height 5\' 2"  (1.575 m), weight 86.8 kg, SpO2 96 %.    Medical Problem List and Plan: 1. 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  Provided dietary education  Discussed symptoms she should look out for that would be concerning for new stroke.   -Interdisciplinary Team Conference today   2.  Antithrombotics: -DVT/anticoagulation:  Pharmaceutical: Lovenox             -antiplatelet therapy: Aspirin 81 mg and Plavix 75 mg for 3 weeks followed by aspirin 81 mg   3. Pain Management: Tylenol as needed             -headaches are very mild and appear to be only tension-type headaches in left temple area. 4. Mood/Behavior/Sleep: LCSW to evaluate and provide emotional support             -antipsychotic agents: n/a   5. Neuropsych/cognition: This patient is capable of making decisions on her own behalf.   6. Skin/Wound Care: Routine skin care checks   7. Fluids/Electrolytes/Nutrition: Routine I's and O's and follow-up chemistries             -Heart healthy diet   8: Hyperlipidemia: Continue Crestor 40 mg   9: Sleep apnea:  -continue CPAP mask at nighttime (if tolerates)             -screened for Sleep Smart study   10: Nausea: started on Protonix daily   11: Obesity: BMI = 34.75-->35; weight loss counseling, dietician consult ordered as per patient's request, provided dietary education.  12. Constipation: colace daily ordered 13. Left sided pain behind her eye, chronic, recommended calling ophthalmology for follow-up regarding her cataract. 14.  Suboptimatl vitamin D: start ergocalciferol 50,000U once per week for 7 weeks.  15. Hypotension: decrease Robaxin to daily prn 16. Hypocalcemia: add supplement after dinner 17. Hemorrhoid: witch hazel pad ordered  LOS: 5 days A FACE TO FACE EVALUATION WAS PERFORMED  Joy Shelton P Joy Shelton 11/18/2022, 10:24 AM

## 2022-11-19 NOTE — Progress Notes (Signed)
Occupational Therapy Session Note  Patient Details  Name: Joy Shelton MRN: [id number] Date of Birth: 01-17-1961  Today's Date: 11/19/2022 OT Individual Time: 7026-3785 OT Individual Time Calculation (min): 55 min    Short Term Goals: Week 1:  OT Short Term Goal 1 (Week 1): STGs=LTGs due to patient's length of stay.  Skilled Therapeutic Interventions/Progress Updates:    OT intervention with focus on dynamic standing balance and functional amb without AD. Standing tasks included: BITS activities standing on AirEx and rebounder activities standing on AirEx. No LOB noted with CGA. Ambulation tasks included: tossing 1kg ball while amb in hallway and day room, kicking hover ball in hallway, and two hand dribble with basketball. No LOB noted. Reviewed home safety and kitchen safety recommendations. Pt returned to room and sat in w/c. All needs within reach.   Therapy Documentation Precautions:  Precautions Precautions: None Restrictions Weight Bearing Restrictions: No   Pain:  Pt denies pain this afternoon    Therapy/Group: Individual Therapy  Leroy Libman 11/19/2022, 2:48 PM

## 2022-11-19 NOTE — Progress Notes (Signed)
Physical Therapy Discharge Summary  Patient Details  Name: Joy Shelton MRN: [id number] Date of Birth: 06-20-1961  Date of Discharge from PT service:November 20, 2021  Patient has met 8 of 8 long term goals due to improved activity tolerance, improved balance, improved postural control, increased strength, ability to compensate for deficits, improved awareness, and improved coordination. Patient to discharge at an ambulatory level Independent with and without rollator. Patient's care partner is independent to provide the necessary physical assistance at discharge.  All goals met   Recommendation:  Patient will benefit from ongoing skilled PT services in outpatient setting to continue to advance safe functional mobility, address ongoing impairments in generalized strengthening and endurance, high level dynamic balance/coordination, gait training, NMR, and to minimize fall risk.  Equipment: Rollator for longer distances  Reasons for discharge: treatment goals met  Patient/family agrees with progress made and goals achieved: Yes  PT Discharge Precautions/Restrictions Precautions Precautions: None Restrictions Weight Bearing Restrictions: No Pain Interference Pain Interference Pain Effect on Sleep: 0. Does not apply - I have not had any pain or hurting in the past 5 days Pain Interference with Therapy Activities: 0. Does not apply - I have not received rehabilitationtherapy in the past 5 days Pain Interference with Day-to-Day Activities: 1. Rarely or not at all Cognition Overall Cognitive Status: Within Functional Limits for tasks assessed Arousal/Alertness: Awake/alert Orientation Level: Oriented X4 Memory: Appears intact Awareness: Appears intact Problem Solving: Appears intact Safety/Judgment: Appears intact Comments: slightly anxious Sensation Sensation Light Touch: Appears Intact Proprioception: Appears Intact Additional Comments: Pt reports straining and soreness  around L eye Coordination Gross Motor Movements are Fluid and Coordinated: Yes Fine Motor Movements are Fluid and Coordinated: Yes Coordination and Movement Description: very mild balance deficits Finger Nose Finger Test: [facility] bilaterally Heel Shin Test: [facility] bilaterally Motor  Motor Motor: Within Functional Limits  Mobility Bed Mobility Bed Mobility: Rolling Right;Rolling Left;Sit to Supine;Supine to Sit Rolling Right: Independent Rolling Left: Independent Supine to Sit: Independent Sit to Supine: Independent Transfers Transfers: Sit to Stand;Stand to Sit;Stand Pivot Transfers Sit to Stand: Independent Stand to Sit: Independent Stand Pivot Transfers: Independent Transfer (Assistive device): None Locomotion  Gait Ambulation: Yes Gait Assistance: Independent Gait Distance (Feet): 150 Feet Assistive device: None Gait Gait: Yes Gait Pattern: Impaired Gait Pattern: Narrow base of support Gait velocity: WFL Stairs / Additional Locomotion Stairs: Yes Stairs Assistance: Supervision/Verbal cueing Stair Management Technique: One rail Right Number of Stairs: 22 Height of Stairs: 6 Ramp: Independent with assistive device (no AD) Curb: Supervision/Verbal cueing (no AD) Pick up small object from the floor assist level: Supervision/Verbal cueing Pick up small object from the floor assistive device: no AD Wheelchair Mobility Wheelchair Mobility: No  Trunk/Postural Assessment  Cervical Assessment Cervical Assessment: Within Functional Limits Thoracic Assessment Thoracic Assessment: Within Functional Limits Lumbar Assessment Lumbar Assessment: Within Functional Limits Postural Control Postural Control: Within Functional Limits  Balance Balance Balance Assessed: Yes Static Sitting Balance Static Sitting - Balance Support: Feet supported;No upper extremity supported Static Sitting - Level of Assistance: 7: Independent Dynamic Sitting Balance Dynamic Sitting - Balance Support:  Feet supported;No upper extremity supported Dynamic Sitting - Level of Assistance: 7: Independent Static Standing Balance Static Standing - Balance Support: No upper extremity supported;During functional activity Static Standing - Level of Assistance: 7: Independent Dynamic Standing Balance Dynamic Standing - Balance Support: No upper extremity supported;During functional activity Dynamic Standing - Level of Assistance: 7: Independent Dynamic Standing - Comments: with transfers and gait Extremity Assessment  RLE  Assessment RLE Assessment: Exceptions to [facility] General Strength Comments: grossly generalized to 4+/5 LLE Assessment LLE Assessment: Exceptions to [facility] General Strength Comments: grossly generalized to 4+/5   Alfonse Alpers PT, DPT  11/19/2022, 7:11 AM

## 2022-11-19 NOTE — Discharge Instructions (Addendum)
Inpatient Rehab Discharge Instructions  Joy Shelton Discharge date and time: 11/21/2022   Activities/Precautions/ Functional Status: Activity: no lifting, driving, or strenuous exercise until cleared by MD Diet: cardiac diet Wound Care: none needed Functional status:  ___ No restrictions     ___ Walk up steps independently ___ 24/7 supervision/assistance   ___ Walk up steps with assistance __x_ Intermittent supervision/assistance  ___ Bathe/dress independently ___ Walk with walker     ___ Bathe/dress with assistance ___ Walk Independently    ___ Shower independently ___ Walk with assistance    __x_ Shower with assistance _x__ No alcohol     ___ Return to work/school ____2 weeks____  Special Instructions: No driving, alcohol consumption or tobacco use.  COMMUNITY REFERRALS UPON DISCHARGE:    Outpatient: PT             Agency: [facility] [place] [address] Phone:  [phone]             Appointment Date/Time: WILL ALL YOU TO SET UP FOLLOW UP APPOINTMENT  Medical Equipment/Items Ordered:ROLLATOR AND TUB SEAT                                                 Agency/Supplier:[facility]  [phone]     STROKE/TIA DISCHARGE INSTRUCTIONS SMOKING Cigarette smoking nearly doubles your risk of having a stroke & is the single most alterable risk factor  If you smoke or have smoked in the last 12 months, you are advised to quit smoking for your health. Most of the excess cardiovascular risk related to smoking disappears within a year of stopping. Ask you doctor about anti-smoking medications [place] Quit Line: 1-800-QUIT NOW Free Smoking Cessation Classes [phone]  CHOLESTEROL Know your levels; limit fat & cholesterol in your diet  Lipid Panel     Component Value Date/Time   CHOL 208 (H) 11/09/2022 1255   TRIG 66 11/09/2022 1255   HDL 56 11/09/2022 1255   CHOLHDL 3.7 11/09/2022 1255   VLDL 13 11/09/2022 1255   LDLCALC 139 (H) 11/09/2022  1255     Many patients benefit from treatment even if their cholesterol is at goal. Goal: Total Cholesterol (CHOL) less than 160 Goal:  Triglycerides (TRIG) less than 150 Goal:  HDL greater than 40 Goal:  LDL (LDLCALC) less than 100   BLOOD PRESSURE American Stroke Association blood pressure target is less that 120/80 mm/Hg  Your discharge blood pressure is:  BP: (!) 115/47 Monitor your blood pressure Limit your salt and alcohol intake Many individuals will require more than one medication for high blood pressure  DIABETES (A1c is a blood sugar average for last 3 months) Goal HGBA1c is under 7% (HBGA1c is blood sugar average for last 3 months)  Diabetes: No known diagnosis of diabetes    Lab Results  Component Value Date   HGBA1C 6.1 (H) 11/09/2022    Your HGBA1c can be lowered with medications, healthy diet, and exercise. Check your blood sugar as directed by your physician Call your physician if you experience unexplained or low blood sugars.  PHYSICAL ACTIVITY/REHABILITATION Goal is 30 minutes at least 4 days per week  Activity: Increase activity slowly, Therapies: Physical Therapy: Outpatient and Occupational Therapy: Outpatient Return to work: in 2 weeks Activity decreases your risk of heart attack and stroke and makes your  heart stronger.  It helps control your weight and blood pressure; helps you relax and can improve your mood. Participate in a regular exercise program. Talk with your doctor about the best form of exercise for you (dancing, walking, swimming, cycling).  DIET/WEIGHT Goal is to maintain a healthy weight  Your discharge diet is:  Diet Order             Diet Heart Room service appropriate? Yes; Fluid consistency: Thin  Diet effective now                  thin liquids Your height is:  Height: 5\' 2"  (157.5 cm) Your current weight is: Weight: 86.8 kg Your Body Mass Index (BMI) is:  BMI (Calculated): 34.99 Following the type of diet specifically designed  for you will help prevent another stroke. Your goal weight range is:   Your goal Body Mass Index (BMI) is 19-24. Healthy food habits can help reduce 3 risk factors for stroke:  High cholesterol, hypertension, and excess weight.  RESOURCES Stroke/Support Group:  Call [phone]   STROKE EDUCATION PROVIDED/REVIEWED AND GIVEN TO PATIENT Stroke warning signs and symptoms How to activate emergency medical system (call 911). Medications prescribed at discharge. Need for follow-up after discharge. Personal risk factors for stroke. Pneumonia vaccine given: No Flu vaccine given: No My questions have been answered, the writing is legible, and I understand these instructions.  I will adhere to these goals & educational materials that have been provided to me after my discharge from the hospital.     My questions have been answered and I understand these instructions. I will adhere to these goals and the provided educational materials after my discharge from the hospital.  Patient/Caregiver Signature _______________________________ Date __________  Clinician Signature _______________________________________ Date __________  Please bring this form and your medication list with you to all your follow-up doctor's appointments.

## 2022-11-19 NOTE — Progress Notes (Signed)
Physical Therapy Session Note  Patient Details  Name: Joy Shelton MRN: [id number] Date of Birth: 1961/01/04  Today's Date: 11/19/2022 PT Individual Time: 1100-1200 PT Individual Time Calculation (min): 60 min   Short Term Goals: Week 1:  PT Short Term Goal 1 (Week 1): STG=LTG due to LOS  Skilled Therapeutic Interventions/Progress Updates: Pt presents sitting in recliner and eager for therapy.  Pt Transfers sit to stand w/ independence.  Pt amb multiple trials w/ sup to independent w/o AD 400+ w/o LOB.  Pt cued for depressed R shoulder and able to correct.  Pt performed sit to stand transfers from multiple surfaces.  Pt amb on/off elevators, through gift shop w/ quick directions for turns w/o LOB.  Pt negotiated 22 steps w/ 1 handrail and reciprocal gait pattern except for 1-2 steps when descending.  Pt states may use step-to gait pattern in fathers home 2/2 steps w/ carpet and decreased run distance.  Pt performed amb holding large T-ball as well as floppy Airex balance beam, switching from arm to arm.  Pt performed 3 x 10 sit to stand transfers holding large T-ball from couch.  Pt also performed sit to stand from sofa standing on Airex balance beam 2 x 10 and then w/ narrowed BOS.  Pt amb back to room x 400+  to room w/ Independence and remained sitting in w/c, all needs in reach.     Therapy Documentation Precautions:  Precautions Precautions: None Restrictions Weight Bearing Restrictions: No General:   Vital Signs:   Pain:no c/o pain.       Therapy/Group: Individual Therapy  Ladoris Gene 11/19/2022, 12:01 PM

## 2022-11-19 NOTE — Progress Notes (Signed)
PROGRESS NOTE   Subjective/Complaints: No new complaints this morning Now moving bowels regularly Asks about return to work and driving Bowels moving regularly now.   ROS: + chronic pain behind left eye- improved, +anxiety about having another stroke, +tingling and numbness, +constipation- improved Objective:   No results found. No results for input(s): "WBC", "HGB", "HCT", "PLT" in the last 72 hours.  No results for input(s): "NA", "K", "CL", "CO2", "GLUCOSE", "BUN", "CREATININE", "CALCIUM" in the last 72 hours.   Intake/Output Summary (Last 24 hours) at 11/19/2022 0942 Last data filed at 11/19/2022 0700 Gross per 24 hour  Intake 708 ml  Output --  Net 708 ml        Physical Exam: Vital Signs Blood pressure 130/61, pulse 87, temperature 98.2 F (36.8 C), temperature source Oral, resp. rate 18, height 5\' 2"  (1.575 m), weight 86.8 kg, SpO2 99 %. Gen: no distress, normal appearing, BMI 35.00 HEENT: oral mucosa pink and moist, NCAT Cardio: Reg rate Chest: normal effort, normal rate of breathing Abd: soft, non-distended Ext: no edema Psych: pleasant, normal affect Skin: intact Neurological:     Mental Status: She is alert.     Comments: Alert and oriented x 3. Normal insight and awareness. Intact Memory. Normal language and minimal dysarthria. Cranial nerve exam unremarkable. Motor 4+/5 L slightly greater than right. Sl decrease in [facility] R>L UE and LE to lesser extent. Was feeding self using right hand however. Sensory exam normal for light touch and pain in all 4 limbs. No limb ataxia or cerebellar signs. No abnormal tone appreciated.  CG for foot clearance Ambulating supervision without AD   Assessment/Plan: 1. Functional deficits which require 3+ hours per day of interdisciplinary therapy in a comprehensive inpatient rehab setting. Physiatrist is providing close team supervision and 24 hour management of active  medical problems listed below. Physiatrist and rehab team continue to assess barriers to discharge/monitor patient progress toward functional and medical goals  Care Tool:  Bathing    Body parts bathed by patient: Right arm, Left arm, Chest, Abdomen, Front perineal area, Buttocks, Right upper leg, Left upper leg, Right lower leg, Left lower leg, Face         Bathing assist Assist Level: Supervision/Verbal cueing     Upper Body Dressing/Undressing Upper body dressing   What is the patient wearing?: Pull over shirt    Upper body assist Assist Level: Set up assist    Lower Body Dressing/Undressing Lower body dressing      What is the patient wearing?: Underwear/pull up, Pants     Lower body assist Assist for lower body dressing: Supervision/Verbal cueing     Toileting Toileting    Toileting assist Assist for toileting: Independent     Transfers Chair/bed transfer  Transfers assist     Chair/bed transfer assist level: Supervision/Verbal cueing     Locomotion Ambulation   Ambulation assist      Assist level: Supervision/Verbal cueing Assistive device: No Device Max distance: 123ft   Walk 10 feet activity   Assist     Assist level: Supervision/Verbal cueing Assistive device: No Device   Walk 50 feet activity   Assist    Assist  level: Supervision/Verbal cueing Assistive device: No Device    Walk 150 feet activity   Assist Walk 150 feet activity did not occur: Safety/medical concerns (fatigue)  Assist level: Supervision/Verbal cueing Assistive device: No Device    Walk 10 feet on uneven surface  activity   Assist     Assist level: Minimal Assistance - Patient > 75%     Wheelchair     Assist Is the patient using a wheelchair?: Yes Type of Wheelchair: Manual    Wheelchair assist level: Dependent - Patient 0% Max wheelchair distance: >170ft    Wheelchair 50 feet with 2 turns activity    Assist        Assist  Level: Dependent - Patient 0%   Wheelchair 150 feet activity     Assist      Assist Level: Dependent - Patient 0%   Blood pressure 130/61, pulse 87, temperature 98.2 F (36.8 C), temperature source Oral, resp. rate 18, height 5\' 2"  (1.575 m), weight 86.8 kg, SpO2 99 %.    Medical Problem List and Plan: 1. 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  Provided dietary education  Discussed symptoms she should look out for that would be concerning for new stroke.   Discussed return to work in 2 weeks 2.  Impaired mobility: d/c lovenox since ambulating >1,000 feet             -antiplatelet therapy: Aspirin 81 mg and Plavix 75 mg for 3 weeks followed by aspirin 81 mg   3. Headaches: Tylenol as needed             -headaches are very mild and appear to be only tension-type headaches in left temple area. Recommended outpatient ophthalmology follow-up 4. Mood/Behavior/Sleep: LCSW to evaluate and provide emotional support             -antipsychotic agents: n/a   5. Neuropsych/cognition: This patient is capable of making decisions on her own behalf.   6. Skin/Wound Care: Routine skin care checks   7. Fluids/Electrolytes/Nutrition: Routine I's and O's and follow-up chemistries             -Heart healthy diet   8: Hyperlipidemia: Continue Crestor 40 mg   9: Sleep apnea:  -continue CPAP mask at nighttime (if tolerates)             -screened for Sleep Smart study   10: Nausea: started on Protonix daily   11: Obesity: BMI = 34.75-->35; weight loss counseling, dietician consult ordered as per patient's request, provided dietary education.  12. Constipation: colace daily ordered, continue senna tea 13. Left sided pain behind her eye, chronic, recommended calling ophthalmology for  follow-up regarding her cataract. 14. Suboptimatl vitamin D: start ergocalciferol 50,000U once per week for 7 weeks.  15. Hypotension: decrease Robaxin to daily prn 16. Hypocalcemia: add supplement after dinner 17. Hemorrhoid: witch hazel pad ordered  LOS: 6 days A FACE TO FACE EVALUATION WAS PERFORMED  Clide Deutscher Paeton Latouche 11/19/2022, 9:42 AM

## 2022-11-19 NOTE — Progress Notes (Signed)
Physical Therapy Session Note  Patient Details  Name: Joy Shelton MRN: [id number] Date of Birth: 1961-11-09  Today's Date: 11/19/2022 PT Individual Time: 7341-9379 PT Individual Time Calculation (min): 70 min   Short Term Goals: Week 1:  PT Short Term Goal 1 (Week 1): STG=LTG due to LOS  Skilled Therapeutic Interventions/Progress Updates:   Received pt semi-reclined in bed, pt agreeable to PT treatment, and denied any pain during session. Session with emphasis on functional mobility/transfers, dressing, generalized strengthening and endurance, dynamic standing balance/coordination, NMR, simulated car transfers, and gait training. Went through sensation, MMT, and pain interference questionnaire. Pt performed bed mobility mod I with HOB elevated and performed all transfers without AD independently throughout session. Pt ambulated around room without AD independently to gather clothes to get dressed and MD arrived for morning rounds. Pt able to get dressed sitting and standing EOB independently with no LOB noted. Pt then stood and brushed teeth at sink independently and ambulated in/out of bathroom without AD and perform all toileting tasks independently. Pt with 1 instance catching foot on bedrail but able to correct LOB without assist.   Pt then ambulated ~547ft without AD independently/mod I to/from rehab apartment (returned to room to get cell phone) and practiced bed mobility from regular bed independently and transfers on/off low sitting and rocking furniture (couch and recliner) without AD independently. Ambulated additional 168ft x 2 trials without AD and independently to/from ortho gym and performed simulated car transfer without AD independently and ambulated 21ft on uneven surfaces (ramp and mulch) mod I and navigated 1 6in curb without AD and supervision.   Pt performed seated BUE strengthening on level 7 alternating 1 minute forward and 1 minute backwards for 6 minutes with emphasis on  UE strength and endurance. Ambulated 157ft without AD independently to main therapy gym and worked on the following activities on [employer] with emphasis on dynamic standing balance: -weight shift training on level static for 1 minute and 25 seconds with BUE support fading to no UE support -weight shift training on level 7 with BUE support fading to intermittent 1 UE support for 2 minutes -limits of stability training on level static with BUE support fading to intermittent 1 UE support for 1 minute and 52 seconds -limits of stability training on level 7 with 1 UE support for 1 minute x 2 trials  -catch baseball game on level hard for 3 minutes on level 12 with bilateral fingertip support Pt performed TUG without AD independently with average of 9.3 seconds - educated pt on test results and significance: Trial 1: 10 seconds  Trial 2: 9 seconds Trial 3: 9 seconds Pt then performed forward tandem walking and lateral side stepping along 31ft Airex x 4 trials each with min A overall for balance. Pt ambulated 153ft without AD independently back to room and ambulated in/out of bathroom independently and able to perform all toileting tasks without assist. Concluded session with pt sitting in [facility] with all needs within reach.   Therapy Documentation Precautions:  Precautions Precautions: Fall Restrictions Weight Bearing Restrictions: No  Therapy/Group: Individual Therapy Alfonse Alpers PT, DPT  11/19/2022, 7:07 AM

## 2022-11-19 NOTE — Discharge Summary (Signed)
Physician Discharge Summary  Patient ID: Joy Shelton MRN: [id number] DOB/AGE: 61-Dec-1962 61 y.o.  Admit date: 11/13/2022 Discharge date: 11/21/2022  Discharge Diagnoses:  Principal Problem:   Lacunar infarct, acute [facility]) Active problems: Functional deficits secondary to acute lacunar infarct Hyperlipidemia Sleep apnea Nausea Obesity Tension type headaches Constipation Suboptimal vitamin D level Hypotension Hypocalcemia Hemorrhoids  Discharged Condition: good  Labs:  Basic Metabolic Panel:    Latest Ref Rng & Units 11/16/2022   12:05 AM 11/11/2022    3:28 AM 11/09/2022    1:14 PM  BMP  Glucose 70 - 99 mg/dL 784  93  696   BUN 6 - 20 mg/dL 16  15  9    Creatinine 0.44 - 1.00 mg/dL 2.95  2.84  1.32   Sodium 135 - 145 mmol/L 137  139  142   Potassium 3.5 - 5.1 mmol/L 3.9  3.6  3.7   Chloride 98 - 111 mmol/L 104  106  108   CO2 22 - 32 mmol/L 23  23    Calcium 8.9 - 10.3 mg/dL 8.7  8.8      CBC:    Latest Ref Rng & Units 11/16/2022    7:57 AM 11/09/2022    1:14 PM 11/08/2022    2:10 AM  CBC  WBC 4.0 - 10.5 K/uL 9.1   9.9   Hemoglobin 12.0 - 15.0 g/dL 44.0  10.2  72.5   Hematocrit 36.0 - 46.0 % 42.1  40.0  45.3   Platelets 150 - 400 K/uL 261   373       Brief HPI:   Joy Shelton is a 62 y.o. female  initially presented to the ED with fluctuating paraesthesia and transient speech disturbance on 11/04/2022.  MRI was negative on 12/31 for acute infarction and she was discharged home.  Due to ongoing speech disturbance and generalized weakness, she returned back to the ED for further evaluation on 11/08/2022.  A repeat MRI of the brain was positive for acute lacunar infarcts in the central pons left greater than right.  There was no associated hemorrhage or mass effect.  CTA of the head and neck was also performed without emergent large vessel occlusions.  She was started on DAPT.  Lovenox started for DVT prophylaxis. She complained of morning nausea and was started on  Protonix. Discussed switching to atorvastatin but she stated she was intolerant. Her home dose of rosuvastatin was increased to 40 mg daily. She is tolerating heart healthy diet.     Hospital Course: Joy Shelton was admitted to rehab 11/13/2022 for inpatient therapies to consist of PT, ST and OT at least three hours five days a week. Past admission physiatrist, therapy team and rehab RN have worked together to provide customized collaborative inpatient rehab.  She was able to tolerate CPAP overnight 1/5 using a different mask and qualified for sleep smart study.  She was randomized to the CPAP treatment arm as per Dr. Marlis Edelson note.  Complained of chronic ocular pain on the left and mild headache.  Serum vitamin D level suboptimal and started on supplementation.  Discussed returning to work  at home in approximately 2 weeks after discharge.  Blood pressures were monitored on TID basis and had to have mild hypotension and decreased Robaxin to every 8 hours as needed.  Rehab course: During patient's stay in rehab weekly team conferences were held to monitor patient's progress, set goals and discuss barriers to discharge. At admission, patient required supervision-CGA with  basic self-care skills and min assist with mobility.  She  has had improvement in activity tolerance, balance, postural control as well as ability to compensate for deficits. She has had improvement in functional use RUE/LUE  and RLE/LLE as well as improvement in awareness  Discharge disposition: 01-Home or Self Care     Diet: Heart healthy  Special Instructions: No driving, alcohol consumption or tobacco use.  Recommend ophthalmology follow-up as outpatient.  30-35 minutes were spent on discharge planning and discharge summary.  Discharge Instructions     Ambulatory referral to Neurology   Complete by: As directed    An appointment is requested in approximately: 4 weeks   Ambulatory referral to Physical Medicine Rehab    Complete by: As directed    Hospital follow-up      Allergies as of 11/21/2022   No Known Allergies      Medication List     STOP taking these medications    VITAMIN D PO       TAKE these medications    acetaminophen 325 MG tablet Commonly known as: TYLENOL Take 1-2 tablets (325-650 mg total) by mouth every 4 (four) hours as needed for mild pain. What changed:  medication strength how much to take when to take this reasons to take this   aspirin EC 81 MG tablet Take 1 tablet (81 mg total) by mouth daily. Swallow whole.   calcium carbonate 500 MG chewable tablet Commonly known as: TUMS - dosed in mg elemental calcium Chew 1 tablet (500 mg total) by mouth daily after supper.   clopidogrel 75 MG tablet Commonly known as: PLAVIX Take 1 tablet (75 mg total) by mouth daily for 10 days.   magnesium gluconate 500 MG tablet Commonly known as: MAGONATE Take 0.5 tablets (250 mg total) by mouth at bedtime.   pantoprazole 40 MG tablet Commonly known as: PROTONIX Take 1 tablet (40 mg total) by mouth daily at 6 (six) AM. What changed: when to take this   rosuvastatin 40 MG tablet Commonly known as: CRESTOR Take 1 tablet (40 mg total) by mouth daily.   saccharomyces boulardii 250 MG capsule Commonly known as: FLORASTOR Take 1 capsule (250 mg total) by mouth 2 (two) times daily.   Vitamin D (Ergocalciferol) 1.25 MG (50000 UNIT) Caps capsule Commonly known as: DRISDOL Take 1 capsule (50,000 Units total) by mouth every 7 (seven) days. Each Wednesday. Start taking on: November 25, 2022   witch hazel-glycerin pad Commonly known as: TUCKS Apply topically as needed for itching.        Follow-up Information     Laurann [place], MD Follow up.   Specialty: Family Medicine Why: Call the office in 1-2 days to make arrangments for hospital follow-up appointment. Contact information: [address] [place] [address] [phone]         [facility] Follow up.   Why: Call the office in 1-2 days to make arrangments for hospital follow-up appointment. Contact information: [address]     [address] [place] [address] [phone]        Horton Chin, MD Follow up.   Specialty: Physical Medicine and Rehabilitation Why: office will call you to arrange your appt (sent) Contact information: [address] [place] [address] [phone]                 Signed: Milinda Antis 11/23/2022, 8:28 AM

## 2022-11-20 MED ORDER — ASPIRIN 81 MG PO TBEC: 81.0000 mg | DELAYED_RELEASE_TABLET | Freq: Every day | ORAL | 12 refills | Status: AC

## 2022-11-20 MED ORDER — CLOPIDOGREL BISULFATE 75 MG PO TABS: 75.0000 mg | ORAL_TABLET | Freq: Every day | ORAL | 0 refills | Status: DC

## 2022-11-20 MED ORDER — WITCH HAZEL-GLYCERIN EX PADS: MEDICATED_PAD | CUTANEOUS | 12 refills | Status: DC | PRN

## 2022-11-20 MED ORDER — VITAMIN D (ERGOCALCIFEROL) 1.25 MG (50000 UNIT) PO CAPS: 50000.0000 [IU] | ORAL_CAPSULE | ORAL | 0 refills | Status: DC

## 2022-11-20 MED ORDER — ACETAMINOPHEN 325 MG PO TABS: 325.0000 mg | ORAL_TABLET | ORAL | Status: AC | PRN

## 2022-11-20 MED ORDER — CALCIUM CARBONATE ANTACID 500 MG PO CHEW: 500.0000 mg | CHEWABLE_TABLET | Freq: Every day | ORAL | Status: DC

## 2022-11-20 MED ORDER — MAGNESIUM GLUCONATE 500 MG PO TABS: 250.0000 mg | ORAL_TABLET | Freq: Every day | ORAL | 0 refills | Status: AC

## 2022-11-20 MED ORDER — CLOPIDOGREL BISULFATE 75 MG PO TABS: 75.0000 mg | ORAL_TABLET | Freq: Every day | ORAL | 0 refills | Status: AC

## 2022-11-20 MED ORDER — ROSUVASTATIN CALCIUM 40 MG PO TABS: 40.0000 mg | ORAL_TABLET | Freq: Every day | ORAL | 0 refills | Status: DC

## 2022-11-20 MED ORDER — PANTOPRAZOLE SODIUM 40 MG PO TBEC: 40.0000 mg | DELAYED_RELEASE_TABLET | Freq: Every day | ORAL | 0 refills | Status: DC

## 2022-11-20 MED ORDER — SACCHAROMYCES BOULARDII 250 MG PO CAPS: 250.0000 mg | ORAL_CAPSULE | Freq: Two times a day (BID) | ORAL | 0 refills | Status: DC

## 2022-11-20 NOTE — Plan of Care (Signed)
  Problem: RH Balance Goal: LTG Patient will maintain dynamic standing with ADLs (OT) Description: LTG:  Patient will maintain dynamic standing balance with assist during activities of daily living (OT)  Outcome: Completed/Met   Problem: Sit to Stand Goal: LTG:  Patient will perform sit to stand in prep for activites of daily living with assistance level (OT) Description: LTG:  Patient will perform sit to stand in prep for activites of daily living with assistance level (OT) Outcome: Completed/Met   Problem: RH Bathing Goal: LTG Patient will bathe all body parts with assist levels (OT) Description: LTG: Patient will bathe all body parts with assist levels (OT) Outcome: Completed/Met   Problem: RH Dressing Goal: LTG Patient will perform lower body dressing w/assist (OT) Description: LTG: Patient will perform lower body dressing with assist, with/without cues in positioning using equipment (OT) Outcome: Completed/Met   Problem: RH Toileting Goal: LTG Patient will perform toileting task (3/3 steps) with assistance level (OT) Description: LTG: Patient will perform toileting task (3/3 steps) with assistance level (OT)  Outcome: Completed/Met   Problem: RH Simple Meal Prep Goal: LTG Patient will perform simple meal prep w/assist (OT) Description: LTG: Patient will perform simple meal prep with assistance, with/without cues (OT). Outcome: Completed/Met   Problem: RH Laundry Goal: LTG Patient will perform laundry w/assist, cues (OT) Description: LTG: Patient will perform laundry with assistance, with/without cues (OT). Outcome: Completed/Met   Problem: RH Light Housekeeping Goal: LTG Patient will perform light housekeeping w/assist (OT) Description: LTG: Patient will perform light housekeeping with assistance, with/without cues (OT). Outcome: Completed/Met   Problem: RH Toilet Transfers Goal: LTG Patient will perform toilet transfers w/assist (OT) Description: LTG: Patient will  perform toilet transfers with assist, with/without cues using equipment (OT) Outcome: Completed/Met   Problem: RH Tub/Shower Transfers Goal: LTG Patient will perform tub/shower transfers w/assist (OT) Description: LTG: Patient will perform tub/shower transfers with assist, with/without cues using equipment (OT) Outcome: Completed/Met

## 2022-11-20 NOTE — Progress Notes (Signed)
Occupational Therapy Discharge Summary  Patient Details  Name: Joy Shelton MRN: [id number] Date of Birth: 24-Dec-1960  Date of Discharge from [facility] service:November 20, 2022   Patient has met 10 of 10 long term goals due to improved activity tolerance, improved balance, and improved coordination.  Patient to discharge at overall Independent level.  Patient's care partner is independent to provide the necessary physical assistance at discharge.    All goals met  Recommendation:  No follow up at this time  Equipment: Shower chair  Reasons for discharge: treatment goals met  Patient/family agrees with progress made and goals achieved: Yes  OT Discharge Precautions/Restrictions  Precautions Precautions: None Restrictions Weight Bearing Restrictions: No ADL ADL Eating: Independent Where Assessed-Eating: Wheelchair Grooming: Independent Where Assessed-Grooming: Standing at sink Upper Body Bathing: Independent Where Assessed-Upper Body Bathing: Shower Lower Body Bathing: Modified independent Where Assessed-Lower Body Bathing: Shower Upper Body Dressing: Independent Where Assessed-Upper Body Dressing: Standing at sink Lower Body Dressing: Modified independent Where Assessed-Lower Body Dressing: Sitting at sink, Standing at sink Toileting: Independent Where Assessed-Toileting: Glass blower/designer: Programmer, applications Method: Counselling psychologist: Raised toilet seat Tub/Shower Transfer: Modified independent Clinical cytogeneticist Method: Optometrist: Radio broadcast assistant, Shower seat without back Social research officer, government: Modified independent Social research officer, government Method: Heritage manager: Grab bars, Shower seat with back Vision Baseline Vision/History: 1 Wears glasses (readers) Patient Visual Report: No change from baseline Vision Assessment?: No apparent visual deficits Perception  Perception: Within  Functional Limits Praxis Praxis: Intact Cognition Cognition Overall Cognitive Status: Within Functional Limits for tasks assessed Arousal/Alertness: Awake/alert Orientation Level: Person;Place;Situation Person: Oriented Place: Oriented Situation: Oriented Memory: Appears intact Awareness: Appears intact Problem Solving: Appears intact Safety/Judgment: Appears intact Comments: slightly anxious Brief Interview for Mental Status (BIMS) Repetition of Three Words (First Attempt): 3 Temporal Orientation: Year: Correct Temporal Orientation: Month: Accurate within 5 days Temporal Orientation: Day: Correct Recall: "Sock": Yes, no cue required Recall: "Blue": Yes, no cue required Recall: "Bed": Yes, no cue required BIMS Summary Score: 15 Sensation Sensation Light Touch: Appears Intact Hot/Cold: Appears Intact Proprioception: Appears Intact Stereognosis: Not tested Additional Comments: Pt reports straining and soreness around L eye Coordination Gross Motor Movements are Fluid and Coordinated: Yes Fine Motor Movements are Fluid and Coordinated: Yes Coordination and Movement Description: very mild balance deficits Finger Nose Finger Test: [facility] bilaterally Motor  Motor Motor: Within Functional Limits Mobility  Bed Mobility Bed Mobility: Rolling Right;Rolling Left;Sit to Supine;Supine to Sit Rolling Right: Independent Rolling Left: Independent Supine to Sit: Independent Sit to Supine: Independent Transfers Sit to Stand: Independent Stand to Sit: Independent  Trunk/Postural Assessment  Cervical Assessment Cervical Assessment: Within Functional Limits Thoracic Assessment Thoracic Assessment: Within Functional Limits Lumbar Assessment Lumbar Assessment: Within Functional Limits Postural Control Postural Control: Within Functional Limits  Balance Balance Balance Assessed: Yes Static Sitting Balance Static Sitting - Balance Support: Feet supported;No upper extremity  supported Static Sitting - Level of Assistance: 7: Independent Dynamic Sitting Balance Dynamic Sitting - Balance Support: Feet supported;No upper extremity supported Dynamic Sitting - Level of Assistance: 7: Independent Static Standing Balance Static Standing - Balance Support: No upper extremity supported;During functional activity Static Standing - Level of Assistance: 7: Independent Dynamic Standing Balance Dynamic Standing - Balance Support: No upper extremity supported;During functional activity Dynamic Standing - Level of Assistance: 7: Independent Dynamic Standing - Comments: with transfers and gait Extremity/Trunk Assessment RUE Assessment RUE Assessment: Within Functional Limits LUE Assessment LUE Assessment: Within Functional Limits  [place], MS, OTR/L  11/20/2022, 3:35 PM

## 2022-11-20 NOTE — Progress Notes (Signed)
PROGRESS NOTE   Subjective/Complaints: Discussed type of stroke she had, risk factors, return to work and driving. She has been reading patient binder.  She asks when she should f/u with her PCP   ROS: + chronic pain behind left eye- improved, +anxiety about having another stroke, +tingling and numbness, +constipation- improved, denies shortness of breath Objective:   No results found. No results for input(s): "WBC", "HGB", "HCT", "PLT" in the last 72 hours.  No results for input(s): "NA", "K", "CL", "CO2", "GLUCOSE", "BUN", "CREATININE", "CALCIUM" in the last 72 hours.   Intake/Output Summary (Last 24 hours) at 11/20/2022 1211 Last data filed at 11/20/2022 0800 Gross per 24 hour  Intake 236 ml  Output --  Net 236 ml        Physical Exam: Vital Signs Blood pressure (!) 115/47, pulse 72, temperature 97.6 F (36.4 C), temperature source Oral, resp. rate 18, height 5\' 2"  (1.575 m), weight 86.8 kg, SpO2 99 %. Gen: no distress, normal appearing, BMI 35.00 HEENT: oral mucosa pink and moist, NCAT Cardio: Reg rate Chest: normal effort, normal rate of breathing Abd: soft, non-distended Ext: no edema Psych: pleasant, normal affect Skin: intact Neurological:     Mental Status: She is alert.     Comments: Alert and oriented x 3. Normal insight and awareness. Intact Memory. Normal language and minimal dysarthria. Cranial nerve exam unremarkable. Motor 4+/5 L slightly greater than right. Sl decrease in [facility] R>L UE and LE to lesser extent. Was feeding self using right hand however. Sensory exam normal for light touch and pain in all 4 limbs. No limb ataxia or cerebellar signs. No abnormal tone appreciated.  CG for foot clearance Ambulating supervision without AD Independent for ADLs   Assessment/Plan: 1. Functional deficits which require 3+ hours per day of interdisciplinary therapy in a comprehensive inpatient rehab  setting. Physiatrist is providing close team supervision and 24 hour management of active medical problems listed below. Physiatrist and rehab team continue to assess barriers to discharge/monitor patient progress toward functional and medical goals  Care Tool:  Bathing    Body parts bathed by patient: Right arm, Left arm, Chest, Abdomen, Front perineal area, Buttocks, Right upper leg, Left upper leg, Right lower leg, Left lower leg, Face         Bathing assist Assist Level: Independent with assistive device Assistive Device Comment: seated on chair/bench   Upper Body Dressing/Undressing Upper body dressing   What is the patient wearing?: Pull over shirt    Upper body assist Assist Level: Independent    Lower Body Dressing/Undressing Lower body dressing      What is the patient wearing?: Underwear/pull up, Pants     Lower body assist Assist for lower body dressing: Independent     Toileting Toileting    Toileting assist Assist for toileting: Independent     Transfers Chair/bed transfer  Transfers assist     Chair/bed transfer assist level: Independent     Locomotion Ambulation   Ambulation assist      Assist level: Independent Assistive device: No Device Max distance: >129ft   Walk 10 feet activity   Assist     Assist level: Independent  Assistive device: No Device   Walk 50 feet activity   Assist    Assist level: Independent Assistive device: No Device    Walk 150 feet activity   Assist Walk 150 feet activity did not occur: Safety/medical concerns (fatigue)  Assist level: Independent Assistive device: No Device    Walk 10 feet on uneven surface  activity   Assist     Assist level: Independent with assistive device Assistive device: Other (comment) (no AD)   Wheelchair     Assist Is the patient using a wheelchair?: No Type of Wheelchair: Manual Wheelchair activity did not occur: N/A  Wheelchair assist level:  Dependent - Patient 0% Max wheelchair distance: >166ft    Wheelchair 50 feet with 2 turns activity    Assist    Wheelchair 50 feet with 2 turns activity did not occur: N/A   Assist Level: Dependent - Patient 0%   Wheelchair 150 feet activity     Assist  Wheelchair 150 feet activity did not occur: N/A   Assist Level: Dependent - Patient 0%   Blood pressure (!) 115/47, pulse 72, temperature 97.6 F (36.4 C), temperature source Oral, resp. rate 18, height 5\' 2"  (1.575 m), weight 86.8 kg, SpO2 99 %.    Medical Problem List and Plan: 1. 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  Provided dietary education  Discussed symptoms she should look out for that would be concerning for new stroke.   Discussed return to work in 2 weeks  Discussed type of stroke she had 2.  Impaired mobility: d/c lovenox since ambulating >1,000 feet             -antiplatelet therapy: continue Aspirin 81 mg and Plavix 75 mg for 3 weeks followed by aspirin 81 mg   3. Headaches: Tylenol as needed             -headaches are very mild and appear to be only tension-type headaches in left temple area. Recommended outpatient ophthalmology follow-up. Provided with pain relief journal. 4. Mood/Behavior/Sleep: LCSW to evaluate and provide emotional support             -antipsychotic agents: n/a   5. Neuropsych/cognition: This patient is capable of making decisions on her own behalf.   6. Skin/Wound Care: Routine skin care checks   7. Fluids/Electrolytes/Nutrition: Routine I's and O's and follow-up chemistries             -Heart healthy diet   8: Hyperlipidemia: Continue Crestor 40 mg   9: Sleep apnea:  -continue CPAP mask at nighttime (if tolerates)             -screened for Sleep Smart  study  Discussed causes of sleep apnea.   10: Nausea: started on Protonix daily   11: Obesity: BMI = 34.75-->35; weight loss counseling, dietician consult ordered as per patient's request, provided dietary education.  12. Constipation: colace daily ordered, continue senna tea 13. Left sided pain behind her eye, chronic, recommended calling ophthalmology for follow-up regarding her cataract. 14. Suboptimatl vitamin D: start ergocalciferol 50,000U once per week for 7 weeks.  15. Hypotension: decrease Robaxin to daily prn 16. Hypocalcemia: add supplement after dinner 17. Hemorrhoid:  witch hazel pad ordered  LOS: 7 days A FACE TO FACE EVALUATION WAS PERFORMED  Clide Deutscher Joy Shelton 11/20/2022, 12:11 PM

## 2022-11-20 NOTE — Progress Notes (Addendum)
Patient ID: Joy Shelton, female   DOB: 1961-06-19, 62 y.o.   MRN: [id number]  Met with pt to discuss equipment-rollator and tub seat have ordered via [employer] to be delivered to her room prior to her discharge tomorrow. Have also reached out to [facility] for OPPT and have made a referral for PT. They will follow up with pt to make appointment. May have co-pay for equipment once know will let her know.  3;49 PM Pt aware will need to pay co-pay before equipment will be delivered. Have contact [employer] to see about co-pay had to leave a message.

## 2022-11-20 NOTE — Progress Notes (Signed)
Physical Therapy Session Note  Patient Details  Name: Joy Shelton MRN: [id number] Date of Birth: [birth year]  Today's Date: 11/20/2022 PT Individual Time: 1017-5102   89min  Short Term Goals: Week 1:  PT Short Term Goal 1 (Week 1): STG=LTG due to LOS Week 2:     Skilled Therapeutic Interventions/Progress Updates:   PT instructed pt in TUG: 9.16 sec (average of 3 trials; >13.5 sec indicates increased fall risk)  Patient demonstrates increased fall risk as noted by score of   52/56 on Berg Balance Scale.  (<36= high risk for falls, close to 100%; 37-45 significant >80%; 46-51 moderate >50%; 52-55 lower >25%)  Patient demonstrates increased fall risk as noted by score of 25/30 on  Functional Gait Assessment.   <22/30 = predictive of falls, <20/30 = fall in 6 months, <18/30 = predictive of falls in PD MCID: 5 points stroke population, 4 points geriatric population (ANPTA Core Set of Outcome Measures for Adults with Neurologic Conditions, 2018)  6 Min Walk Test:  Instructed patient to ambulate as quickly and as safely as possible for 6 minutes using LRAD. Patient was allowed to take standing rest breaks without stopping the test, but if the patient required a sitting rest break the clock would be stopped and the test would be over.  Results: 5852 feet  using a no AD with distance supervision assist. Results indicate that the patient has reduced endurance with ambulation compared to age matched norms.  Age Matched Norms: 56-69 yo M: 46 F: 15, 33-79 yo M: 60 F: 471, 67-89 yo M: 417 F: 392 MDC: 58.21 meters (190.98 feet) or 50 meters (ANPTA Core Set of Outcome Measures for Adults with Neurologic Conditions, 2018)  Patient returned to room and performed stand pivot to recliner without assist. Pt left sitting in recliner with call bell in reach and all needs met.        Therapy Documentation Precautions:  Precautions Precautions: None Restrictions Weight Bearing Restrictions:  No General:   Vital Signs: Therapy Vitals Temp: 98.1 F (36.7 C) Pulse Rate: 80 Resp: 18 BP: 122/70 Patient Position (if appropriate): Sitting Oxygen Therapy SpO2: 97 % O2 Device: Room Air Pain: Pain Assessment Pain Scale: 0-10 Pain Score: 0-No pain     Balance: Standardized Balance Assessment Standardized Balance Assessment: Berg Balance Test;Timed Up and Go Test;Functional Gait Assessment Berg Balance Test Sit to Stand: Able to stand without using hands and stabilize independently Standing Unsupported: Able to stand safely 2 minutes Sitting with Back Unsupported but Feet Supported on Floor or Stool: Able to sit safely and securely 2 minutes Stand to Sit: Sits safely with minimal use of hands Transfers: Able to transfer safely, minor use of hands Standing Unsupported with Eyes Closed: Able to stand 10 seconds safely Standing Ubsupported with Feet Together: Able to place feet together independently and stand 1 minute safely From Standing, Reach Forward with Outstretched Arm: Can reach confidently >25 cm (10") From Standing Position, Pick up Object from Floor: Able to pick up shoe safely and easily From Standing Position, Turn to Look Behind Over each Shoulder: Looks behind from both sides and weight shifts well Turn 360 Degrees: Able to turn 360 degrees safely in 4 seconds or less Standing Unsupported, Alternately Place Feet on Step/Stool: Able to stand independently and safely and complete 8 steps in 20 seconds Standing Unsupported, One Foot in Front: Able to plae foot ahead of the other independently and hold 30 seconds Standing on One Leg: Able to lift  leg independently and hold equal to or more than 3 seconds Total Score: 53 Timed Up and Go Test Normal TUG (seconds): 9.17 (9.7, 9.0, 8.8) Functional Gait  Assessment Gait Level Surface: Walks 20 ft in less than 5.5 sec, no assistive devices, good speed, no evidence for imbalance, normal gait pattern, deviates no more than  6 in outside of the 12 in walkway width. Change in Gait Speed: Able to smoothly change walking speed without loss of balance or gait deviation. Deviate no more than 6 in outside of the 12 in walkway width. Gait with Horizontal Head Turns: Performs head turns smoothly with slight change in gait velocity (eg, minor disruption to smooth gait path), deviates 6-10 in outside 12 in walkway width, or uses an assistive device. Gait with Vertical Head Turns: Performs task with slight change in gait velocity (eg, minor disruption to smooth gait path), deviates 6 - 10 in outside 12 in walkway width or uses assistive device Gait and Pivot Turn: Pivot turns safely within 3 sec and stops quickly with no loss of balance. Step Over Obstacle: Is able to step over one shoe box (4.5 in total height) without changing gait speed. No evidence of imbalance. Gait with Narrow Base of Support: Ambulates 7-9 steps. Gait with Eyes Closed: Walks 20 ft, uses assistive device, slower speed, mild gait deviations, deviates 6-10 in outside 12 in walkway width. Ambulates 20 ft in less than 9 sec but greater than 7 sec. Ambulating Backwards: Walks 20 ft, no assistive devices, good speed, no evidence for imbalance, normal gait Steps: Alternating feet, no rail. Total Score: 25 Exercises:   Other Treatments:      Therapy/Group: Individual Therapy  Lorie Phenix 11/20/2022, 5:31 PM

## 2022-11-20 NOTE — Progress Notes (Signed)
Occupational Therapy Session Note  Patient Details  Name: Joy Shelton MRN: [id number] Date of Birth: 06/03/1961  Today's Date: 11/20/2022 OT Individual Time: 2725-3664 & 4034-7425 OT Individual Time Calculation (min): 75 min & 70 min   Short Term Goals: Week 1:  OT Short Term Goal 1 (Week 1): STGs=LTGs due to patient's length of stay.  Skilled Therapeutic Interventions/Progress Updates:  Session 1 Skilled OT intervention completed with focus on independence with gathering self-care items/setting up in prep for ADL tasks, functional endurance within a shower context, BUE exercise. Pt received upright in bed, agreeable to session. Reported soreness in BLE however no pain.  Pt reviewed instances with nursing especially at night with her calling for supervision/permission to go to bathroom and given her independent CLOF without an AD for ambulation and with toileting, this OT cleared pt for independence/mod I in room for mobility and ADL needs. Nursing staff made aware.  Pt completed all sit > stands, ambulatory transfers and ADLs with independence this session without the use of AD. No LOB during entirety of session.  She demonstrated appropriate safety strategies during entirety of session. She was able to gather all clothing/toiletry items and prep them for when she got out of the shower. Able to use commode for continent void, then transfer into shower and bathe with mod I at the sit > stand level on tub bench for energy conservation. Dressing and oral care all with independence/mod I.  Ambulated to day room, then completed 2 laps through obstacle course including stepping over hockey sticks, hurdles, onto airex all with independence and no LOB.   Completed the following exercises for generalized strengthening, endurance and balance: (In stance with 5 pound dumbbell in each hand 2x10) -bilateral bicep curl -bilateral bicep curl transitioning into overhead press (In stance with 4  pound dumbbell 2x10) -bilateral chest press -single arm shoulder flexion to 90 degrees  Ambulated back to room, then remained seated in w/c, and with all needs in reach at end of session.  Session 2 Skilled OT intervention completed with focus on standing tolerance, higher level dynamic balance, ambulatory endurance. Pt received seated in recliner with sister and PA present, agreeable to session. No pain reported.  Declined self-care needs at this time. Completed all sit > stands and ambulatory transfers without AD at an independent level this session.  Ambulated to day room, then remained standing for the duration of the following activities on the Wii using affected RUE throughout: -table tennis -Upgraded to task to 2nd trial of table tennis standing on airex still with independence -Just dance x2 long songs using all body parts, and higher level balance movements and quick stepping  Ambulated greater than 1000 ft without AD to [facility] tower and back to room, with good balance during head turning and dual tasking. Great endurance and no LOB noted during session with only 1 seated rest break utilized for duration of session.  Pt remained seated in recliner, with fresh ice per request, and with all needs in reach at end of session.   Therapy Documentation Precautions:  Precautions Precautions: None Restrictions Weight Bearing Restrictions: No    Therapy/Group: Individual Therapy  Blase Mess, MS, OTR/L  11/20/2022, 2:19 PM

## 2022-11-20 NOTE — Progress Notes (Signed)
Inpatient Rehabilitation Care Coordinator Discharge Note DC SAT 11/21/2022  Patient Details  Name: Joy Shelton MRN: [id number] Date of Birth: [birth year]   Discharge location: [place]  Length of Stay: 8 DAYS  Discharge activity level: MOD/I LEVEL  Home/community participation: ACTIVE  Patient response JJ:OACZYS Literacy - How often do you need to have someone help you when you read instructions, pamphlets, or other written material from your doctor or pharmacy?: Never  Patient response AY:TKZSWF Isolation - How often do you feel lonely or isolated from those around you?: Never  Services provided included: MD, RD, PT, OT, RN, SLP, CM, TR, Pharmacy, SW  Financial Services:  Financial Services Utilized: [place]  Choices offered to/list presented to: PT  Follow-up services arranged:  Outpatient, DME, Patient/Family has no preference for HH/DME agencies    Outpatient Servicies: [facility] WILL CALL TO SET UP APPOINTMENT DME : [facility]    Patient response to transportation need: Is the patient able to respond to transportation needs?: Yes In the past 12 months, has lack of transportation kept you from medical appointments or from getting medications?: No In the past 12 months, has lack of transportation kept you from meetings, work, or from getting things needed for daily living?: No    Comments (or additional information):PT DID WELL AND EXCEEDED HER GOALS. FEELS READY TO GO HOME AND SISTER COMING TO BE WITH SHORT TIME.  Patient/Family verbalized understanding of follow-up arrangements:  Yes  Individual responsible for coordination of the follow-up plan: SELF [phone]  Confirmed correct DME delivered: Elease Hashimoto 11/20/2022    Garrick Midgley, Gardiner Rhyme

## 2022-11-21 NOTE — Progress Notes (Signed)
INPATIENT REHABILITATION DISCHARGE NOTE   Discharge instructions by:PA yesterday1/12  Verbalized understanding:yes  Skin care/Wound care healing?none  Pain:none  IV's:none  Tubes/Drains:none  O2:none  Safety instructions: done  Patient belongings:done  Discharged JQ:GBEE  Discharged FEO:[email or web address]/GPQ  Danessa Mensch RN-BC,BSN, WTA

## 2022-11-21 NOTE — Progress Notes (Signed)
Inpatient Rehabilitation Discharge Medication Review by a Pharmacist   A complete drug regimen review was completed for this patient to identify any potential clinically significant medication issues.   High Risk Drug Classes Is patient taking? Indication by Medication  Antipsychotic No    Anticoagulant No    Antibiotic No    Opioid No    Antiplatelet Yes Aspirin/Plavix - stroke ppx *Plavix x 3 weeks (end 11/30/22), then aspirin alone  Hypoglycemics/insulin No    Vasoactive Medication No    Chemotherapy No    Other Yes Crestor - HLD Florastor - probiotic Magnesium, Calcium - supplementation Protonix - GERD        Type of Medication Issue Identified Description of Issue Recommendation(s)  Drug Interaction(s) (clinically significant)        Duplicate Therapy        Allergy        No Medication Administration End Date        Incorrect Dose        Additional Drug Therapy Needed        Significant med changes from prior encounter (inform family/care partners about these prior to discharge).      Other            Clinically significant medication issues were identified that warrant physician communication and completion of prescribed/recommended actions by midnight of the next day:  No   Name of provider notified for urgent issues identified:    Provider Method of Notification:        Pharmacist comments:    Time spent performing this drug regimen review (minutes):  [place], PharmD, [facility] PGY1 Pharmacy Resident 11/21/2022 10:44 AM

## 2022-11-24 ENCOUNTER — Encounter: Payer: Self-pay | Admitting: Physical Therapy

## 2022-11-24 ENCOUNTER — Ambulatory Visit: Payer: 59 | Attending: Physical Medicine and Rehabilitation | Admitting: Physical Therapy

## 2022-11-24 ENCOUNTER — Other Ambulatory Visit (HOSPITAL_BASED_OUTPATIENT_CLINIC_OR_DEPARTMENT_OTHER): Payer: Self-pay

## 2022-11-24 DIAGNOSIS — M6281 Muscle weakness (generalized): Secondary | ICD-10-CM | POA: Diagnosis not present

## 2022-11-24 DIAGNOSIS — I6381 Other cerebral infarction due to occlusion or stenosis of small artery: Secondary | ICD-10-CM | POA: Diagnosis present

## 2022-11-24 DIAGNOSIS — R2681 Unsteadiness on feet: Secondary | ICD-10-CM | POA: Diagnosis present

## 2022-11-24 NOTE — Therapy (Signed)
OUTPATIENT PHYSICAL THERAPY LOWER EXTREMITY EVALUATION   Patient Name: Joy Shelton MRN: [id number] DOB:1961/06/17, 62 y.o., female Today's Date: 11/24/2022  END OF SESSION:  PT End of Session - 11/24/22 0811     Visit Number 1    Number of Visits 12    Date for PT Re-Evaluation 01/05/23    Authorization Type UHC [id number]    PT Start Time 0805    PT Stop Time 0845    PT Time Calculation (min) 40 min    Activity Tolerance Patient tolerated treatment well    Behavior During Therapy WFL for tasks assessed/performed             Past Medical History:  Diagnosis Date   Hypercholesteremia    History reviewed. No pertinent surgical history. Patient Active Problem List   Diagnosis Date Noted   Lacunar infarct, acute (HCC) 11/13/2022   CVA (cerebral vascular accident) (HCC) 11/09/2022   Hyperlipidemia 11/09/2022   Obesity (BMI 30-39.9) 11/09/2022    PCP: Laurann Montana, MD   REFERRING PROVIDER: Horton Chin, MD   REFERRING DIAG: I63.81 CVA   THERAPY DIAG:  Muscle weakness (generalized)  Unsteadiness on feet  Rationale for Evaluation and Treatment: Rehabilitation  ONSET DATE: 11/08/2022  SUBJECTIVE:   SUBJECTIVE STATEMENT: Joy Shelton reports that she went into ED on 11/08/22 with signs of stroke and headache, imaging at that time was clear so presumed migraine, discharged, returned 5 hours later with right sided weakness, at that time imaging did demonstrate acute lacunar infarcts in pons.  She reports she feels that she feels everything has returned quickly overall, but still feels like she has some right leg weakness and impaired balance.     PERTINENT HISTORY: CVA- lacunar infarct pons 11/08/22 PAIN:  Are you having pain? No (mild headache)  PRECAUTIONS: None  WEIGHT BEARING RESTRICTIONS: No  FALLS:  Has patient fallen in last 6 months? 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    NEXT MD VISIT: 12/25/22 - hospital follow-up, 12/31/22- neurologist  OBJECTIVE:   DIAGNOSTIC FINDINGS: MRI Brain 11/09/22 IMPRESSION: 1. 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 No associated hemorrhage or mass effect.   2. Elsewhere stable and normal for age MRI appearance of the brain.  PATIENT SURVEYS:  Stroke Impact Scale 20/80 LEFS 50/80 = 62.5%  COGNITION: Overall cognitive status: Within functional limits for tasks assessed     SENSATION: WFL   POSTURE: No Significant postural limitations  UPPER EXTREMITY MMT:  MMT Right eval Left eval  Shoulder flexion 5 5  Shoulder extension    Shoulder abduction 5 5  Shoulder internal rotation 5 5  Shoulder external rotation 5 5  Elbow flexion 5 5  Elbow extension 5 5  Wrist flexion 5 5  Wrist extension 5 5  Wrist pronation 5 5  Wrist supination 5 5  Grip strength fair good   (Blank rows = not tested)   LOWER EXTREMITY MMT:  MMT Right eval Left eval  Hip flexion 4 4+  Hip extension 4 4+  Hip abduction 4+ 4+  Hip adduction 4 4  Knee flexion 4 4+  Knee extension 5 5  Ankle dorsiflexion 4+ 5  Ankle plantarflexion  5 5   (Blank rows = not tested)  COORDINATION:  Normal UE and LE - Repeated heel to shin, toe taps, alternating arm supination/pronation, thumb to fingers, all normal.   FUNCTIONAL TESTS:  5 times sit to stand: 11 seconds without UE assist Functional gait assessment: 26/30 Tandem 30 sec each side, single leg stance 6 sec each side    [facility] PT Assessment - 11/24/22 0001       Functional Gait  Assessment   Gait Level Surface Walks 20 ft in less than 5.5 sec, no assistive devices, good speed, no evidence for imbalance, normal gait pattern, deviates no more than 6 in  outside of the 12 in walkway width.    Change in Gait Speed Able to smoothly change walking speed without loss of balance or gait deviation. Deviate no more than 6 in outside of the 12 in walkway width.    Gait with Horizontal Head Turns Performs head turns smoothly with no change in gait. Deviates no more than 6 in outside 12 in walkway width    Gait with Vertical Head Turns Performs head turns with no change in gait. Deviates no more than 6 in outside 12 in walkway width.    Gait and Pivot Turn Pivot turns safely in greater than 3 sec and stops with no loss of balance, or pivot turns safely within 3 sec and stops with mild imbalance, requires small steps to catch balance.    Step Over Obstacle Is able to step over 2 stacked shoe boxes taped together (9 in total height) without changing gait speed. No evidence of imbalance.    Gait with Narrow Base of Support Ambulates 4-7 steps.    Gait with Eyes Closed Walks 20 ft, no assistive devices, good speed, no evidence of imbalance, normal gait pattern, deviates no more than 6 in outside 12 in walkway width. Ambulates 20 ft in less than 7 sec.    Ambulating Backwards Walks 20 ft, uses assistive device, slower speed, mild gait deviations, deviates 6-10 in outside 12 in walkway width.    Steps Alternating feet, no rail.    Total Score 26             GAIT: Distance walked: 250' Assistive device utilized: None Level of assistance: Complete Independence Comments: no deviation   TODAY'S TREATMENT:                                                                                                                              DATE:    11/24/2022 Self Care: see patient education.     PATIENT EDUCATION:  Education details: Explanation of symptoms and activity intolerance after stroke, recommendations to take breaks as needed, may feel more fatigued than normal especially in busy environments.  Education on findings and plan of care. Person educated:  Patient Education method: Explanation Education comprehension: verbalized understanding  HOME EXERCISE PROGRAM: TBA  ASSESSMENT:  CLINICAL IMPRESSION: Joy Shelton  is a right hand dominant 62 y.o. female who was seen today for physical therapy evaluation and treatment for post-CVA on 11/08/22.  She reports overall good recovery but continued deficits in RLE strength, R grip strength and balance.  Examination results are consistent with this, she demonstrates noticeable strength impairments on her R side compared to her L, decreased R grip strength, and on FGA scored 26/30, with difficulty with tandem gait.  She also had difficulty maintaining SLS increasing her risk of falls.  Joy Shelton will benefit from skilled physical therapy services to help reach the maximal level of functional independence and mobility.  Patient demonstrates understanding of this plan of care and is in agreement with this plan.     OBJECTIVE IMPAIRMENTS: decreased balance, decreased endurance, difficulty walking, and decreased strength.   ACTIVITY LIMITATIONS: locomotion level  PARTICIPATION LIMITATIONS: driving and occupation  PERSONAL FACTORS: Age and Fitness are also affecting patient's functional outcome.   REHAB POTENTIAL: Good  CLINICAL DECISION MAKING: Stable/uncomplicated  EVALUATION COMPLEXITY: Low   GOALS: Goals reviewed with patient? Yes  SHORT TERM GOALS: Target date: 12/08/2022   Patient will be independent with initial HEP. Baseline: TBA Goal status: INITIAL   LONG TERM GOALS: Target date: 01/05/2023   Patient will be independent with advanced/ongoing HEP to improve outcomes and carryover.  Baseline:  Goal status: INITIAL  2.  Patient will score 30/30 on FGA to demonstrate improved dynamic balance and no fall risk.   Baseline: 26/30 Goal status: INITIAL  3.  Patient will be able to maintain SLS x 30 seconds to decrease fall risk.  Baseline: 6 seconds Goal status: INITIAL    4.  Patient will demonstrate 5/5 bil LE strength.  Baseline: RLE weakness compared to LLE Goal status: INITIAL  5.  Patient will report >60/80 on LEFS to demonstrate improved functional ability. Baseline: 50/80 Goal status: INITIAL  6.  Patient will demonstrate improved R grip strength with goal TBA.  Baseline: NT Goal status: INITIAL  PLAN:  PT FREQUENCY: 2x/week  PT DURATION: 6 weeks  PLANNED INTERVENTIONS: Therapeutic exercises, Therapeutic activity, Neuromuscular re-education, Balance training, Gait training, Patient/Family education, Self Care, Stair training, and Re-evaluation  PLAN FOR NEXT SESSION: Measure grip strength and set goal, needs HEP, focus higher level balance  - single leg, tandem, etc.    Joy Shelton, PT, DPT  11/24/2022, 12:33 PM

## 2022-11-30 ENCOUNTER — Ambulatory Visit: Payer: 59 | Admitting: Physical Therapy

## 2022-11-30 ENCOUNTER — Encounter: Payer: Self-pay | Admitting: Physical Therapy

## 2022-11-30 DIAGNOSIS — R2681 Unsteadiness on feet: Secondary | ICD-10-CM

## 2022-11-30 DIAGNOSIS — M6281 Muscle weakness (generalized): Secondary | ICD-10-CM | POA: Diagnosis not present

## 2022-11-30 DIAGNOSIS — I6381 Other cerebral infarction due to occlusion or stenosis of small artery: Secondary | ICD-10-CM

## 2022-11-30 NOTE — Therapy (Signed)
OUTPATIENT PHYSICAL THERAPY TREATMENT   Patient Name: Joy Shelton MRN: [id number] DOB:[birth year], 61 y.o., female Today's Date: 11/30/2022  END OF SESSION:  PT End of Session - 11/30/22 1703     Visit Number 2    Number of Visits 12    Date for PT Re-Evaluation 01/05/23    Authorization Type UHC [id number]    PT Start Time 1703    PT Stop Time 1747    PT Time Calculation (min) 44 min    Activity Tolerance Patient tolerated treatment well    Behavior During Therapy WFL for tasks assessed/performed              Past Medical History:  Diagnosis Date   Hypercholesteremia    History reviewed. No pertinent surgical history. Patient Active Problem List   Diagnosis Date Noted   Lacunar infarct, acute (HCC) 11/13/2022   CVA (cerebral vascular accident) (HCC) 11/09/2022   Hyperlipidemia 11/09/2022   Obesity (BMI 30-39.9) 11/09/2022    PCP: Laurann Montana, MD  REFERRING PROVIDER: Horton Chin, MD  REFERRING DIAG: I63.81 CVA   THERAPY DIAG:  Muscle weakness (generalized)  Unsteadiness on feet  Lacunar infarct, acute (HCC)  Rationale for Evaluation and Treatment: Rehabilitation  ONSET DATE: 11/08/2022   SUBJECTIVE:   SUBJECTIVE STATEMENT: [name] reports that she feels like her right hand is weaker since the CVA, especially since this is her dominant side.  PERTINENT HISTORY: CVA- lacunar infarct pons 11/08/22 PAIN:  Are you having pain? No   PRECAUTIONS: None  WEIGHT BEARING RESTRICTIONS: No  FALLS:  Has patient fallen in last 6 months? 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    NEXT MD VISIT: 12/25/22 - hospital follow-up, 12/31/22- neurologist   OBJECTIVE:   DIAGNOSTIC FINDINGS: MRI Brain  11/09/22 IMPRESSION: 1. 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 No associated hemorrhage or mass effect.   2. Elsewhere stable and normal for age MRI appearance of the brain.  PATIENT SURVEYS:  Stroke Impact Scale 20/80 LEFS 50/80 = 62.5%  COGNITION: Overall cognitive status: Within functional limits for tasks assessed     SENSATION: WFL  POSTURE:  No Significant postural limitations  UPPER EXTREMITY MMT:  MMT Right eval Left eval  Shoulder flexion 5 5  Shoulder extension    Shoulder abduction 5 5  Shoulder internal rotation 5 5  Shoulder external rotation 5 5  Elbow flexion 5 5  Elbow extension 5 5  Wrist flexion 5 5  Wrist extension 5 5  Wrist pronation 5 5  Wrist supination 5 5  Grip strength fair good   (Blank rows = not tested)   LOWER EXTREMITY MMT:  MMT Right eval Left eval  Hip flexion 4 4+  Hip extension 4 4+  Hip abduction 4+ 4+  Hip adduction 4 4  Knee flexion 4 4+  Knee extension 5 5  Ankle dorsiflexion 4+ 5  Ankle plantarflexion 5 5   (Blank rows = not tested)  UPPER EXTREMITY MMT:  MMT Right 11/30/22 Left 11/30/22  Grip strength 21# 13#     COORDINATION:  Normal UE and LE - Repeated heel to shin, toe taps, alternating arm supination/pronation, thumb to fingers, all normal.  FUNCTIONAL TESTS:  5 times sit to stand: 11 seconds without UE assist Functional gait assessment: 26/30 Tandem 30 sec each side, single leg stance 6 sec each side   GAIT: Distance walked: 250' Assistive device utilized: None Level of assistance: Complete Independence Comments: no deviation   TODAY'S TREATMENT:                                                                                                                              DATE:     11/30/22 THERAPEUTIC EXERCISE: to improve flexibility, strength and mobility.  Verbal and tactile cues throughout for technique. NuStep - L5 x 6 min Red Theraputty: Gross  grip squeeze Finger-thumb opposition 3-point pinch & pull Kep grip pinch R/L SLS + opp LE RTB 4-way SLR x 10 each direction  NEUROMUSCULAR RE-EDUCATION: To improve balance, proprioception, and reduce fall risk. Corner balance: Tandem stance  EO x 30 sec EC x 30 sec Tandem stance on Airex EO x 30 sec Head turns x 5 Head nods x 5  EC x 30 sec  THERAPEUTIC ACTIVITIES: Grip strength (see above)   11/24/2022 Self Care: see patient education.     PATIENT EDUCATION:  Education details: initial HEP  Person educated: Patient Education method: Explanation, Media planner, and Handouts Education comprehension: verbalized understanding, returned demonstration, and needs further education  HOME EXERCISE PROGRAM: Access Code: [email or web address] URL: [email or web address] Date: 11/30/2022 Prepared by: Annie Paras  Exercises - Putty Squeezes  - 1 x daily - 7 x weekly - 2 sets - 10 reps - 3 sec hold - Key Pinch with Putty  - 1 x daily - 7 x weekly - 2 sets - 10 reps - 3 sec hold - Seated Three Finger Pinch with Putty  - 1 x daily - 7 x weekly - 2 sets - 10 reps - 3 sec hold - Finger Pinch and Pull with Putty  - 1 x daily - 7 x weekly - 2 sets - 10 reps - 3 sec hold - Standing Hip Flexion with Anchored Resistance and Chair Support  - 1 x daily - 3-4 x weekly - 2 sets - 10 reps - 3 hold - Standing Hip Adduction with Anchored Resistance  - 1 x daily - 3-4 x weekly - 2 sets - 10 reps - 3 hold - Standing Hip Extension with Anchored Resistance  - 1 x daily - 3-4 x weekly - 2 sets - 10 reps - 3 hold - Standing Hip Abduction with Anchored Resistance  - 1 x daily - 3-4 x weekly - 2 sets - 10 reps - 3 hold - Half Tandem Stance on Foam Pad  - 1 x daily - 3-4 x weekly - 2 sets - 3 reps - 30 sec hold - Half Tandem Stance with Head Rotation on Foam Pad  - 1 x daily - 3-4 x weekly - 2 sets - 5 reps - Half Tandem Stance with Head Nods on Foam  Pad  - 1 x daily - 3-4 x weekly - 2 sets - 5  reps    ASSESSMENT:  CLINICAL IMPRESSION: Grip strength assessment completed with bilateral weakness noted although right hand remaining stronger than nondominant left hand.  Introduced red Theraputty for grip strengthening.  Initiated HEP including grip strengthening as well as proximal lower extremity strengthening incorporating single-leg stance for balance training and corner balance activities using Airex pad as patient reports she purchased one for her home. Berenice was able to perform good return demonstration of all exercises attempted today, therefore HEP instructions provided for home.  OBJECTIVE IMPAIRMENTS: decreased balance, decreased endurance, difficulty walking, and decreased strength.   ACTIVITY LIMITATIONS: locomotion level  PARTICIPATION LIMITATIONS: driving and occupation  PERSONAL FACTORS: Age and Fitness are also affecting patient's functional outcome.   REHAB POTENTIAL: Good  CLINICAL DECISION MAKING: Stable/uncomplicated  EVALUATION COMPLEXITY: Low   GOALS: Goals reviewed with patient? Yes  SHORT TERM GOALS: Target date: 12/08/2022   Patient will be independent with initial HEP. Baseline: TBA Goal status: IN PROGRESS   LONG TERM GOALS: Target date: 01/05/2023   Patient will be independent with advanced/ongoing HEP to improve outcomes and carryover.  Baseline:  Goal status: IN PROGRESS  2.  Patient will score 30/30 on FGA to demonstrate improved dynamic balance and no fall risk.   Baseline: 26/30 Goal status: IN PROGRESS  3.  Patient will be able to maintain SLS x 30 seconds to decrease fall risk.  Baseline: 6 seconds Goal status: IN PROGRESS   4.  Patient will demonstrate 5/5 bil LE strength.  Baseline: RLE weakness compared to LLE Goal status: IN PROGRESS  5.  Patient will report >60/80 on LEFS to demonstrate improved functional ability. Baseline: 50/80 Goal status: IN PROGRESS  6.  Patient will demonstrate improved R grip strength by at  least 10 pounds.  Baseline: 21# Goal status: IN PROGRESS   PLAN:  PT FREQUENCY: 2x/week  PT DURATION: 6 weeks  PLANNED INTERVENTIONS: Therapeutic exercises, Therapeutic activity, Neuromuscular re-education, Balance training, Gait training, Patient/Family education, Self Care, Stair training, and Re-evaluation  PLAN FOR NEXT SESSION: Review initial HEP, focus higher level balance  - single leg, tandem, etc.    Percival Spanish, PT 11/30/2022, 6:39 PM

## 2022-12-08 ENCOUNTER — Encounter: Payer: Self-pay | Admitting: Physical Therapy

## 2022-12-08 ENCOUNTER — Ambulatory Visit: Payer: 59 | Admitting: Physical Therapy

## 2022-12-08 DIAGNOSIS — M6281 Muscle weakness (generalized): Secondary | ICD-10-CM

## 2022-12-08 DIAGNOSIS — R2681 Unsteadiness on feet: Secondary | ICD-10-CM

## 2022-12-08 DIAGNOSIS — I6381 Other cerebral infarction due to occlusion or stenosis of small artery: Secondary | ICD-10-CM

## 2022-12-08 NOTE — Therapy (Signed)
OUTPATIENT PHYSICAL THERAPY TREATMENT   Patient Name: Joy Shelton MRN: [id number] DOB:10/12/1961, 62 y.o., female Today's Date: 12/08/2022  END OF SESSION:  PT End of Session - 12/08/22 1703     Visit Number 3    Number of Visits 12    Date for PT Re-Evaluation 01/05/23    Authorization Type U[employer] [id number]    PT Start Time 1703    PT Stop Time 1744    PT Time Calculation (min) 41 min    Activity Tolerance Patient tolerated treatment well    Behavior During Therapy WFL for tasks assessed/performed              Past Medical History:  Diagnosis Date   Hypercholesteremia    History reviewed. No pertinent surgical history. Patient Active Problem List   Diagnosis Date Noted   Lacunar infarct, acute ([place]) 11/13/2022   CVA (cerebral vascular accident) ([place]) 11/09/2022   Hyperlipidemia 11/09/2022   Obesity (BMI 30-39.9) 11/09/2022    PCP: Harlan Stains, MD  REFERRING PROVIDER: Izora Ribas, MD  REFERRING DIAG: I63.81 CVA   THERAPY DIAG:  Muscle weakness (generalized)  Unsteadiness on feet  Lacunar infarct, acute ([place])  Rationale for Evaluation and Treatment: Rehabilitation  ONSET DATE: 11/08/2022   SUBJECTIVE:   SUBJECTIVE STATEMENT: Delmi reports she is doing well, has been using putty to strengthen her hand.   Started working part-time remote yesterday.    PERTINENT HISTORY: CVA- lacunar infarct pons 11/08/22 PAIN:  Are you having pain? No   PRECAUTIONS: None  WEIGHT BEARING RESTRICTIONS: No  FALLS:  Has patient fallen in last 6 months? 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    NEXT MD VISIT: 12/25/22 - hospital follow-up, 12/31/22- neurologist   OBJECTIVE:   DIAGNOSTIC FINDINGS:  MRI Brain 11/09/22 IMPRESSION: 1. 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 No associated hemorrhage or mass effect.   2. Elsewhere stable and normal for age MRI appearance of the brain.  PATIENT SURVEYS:  Stroke Impact Scale 20/80 LEFS 50/80 = 62.5%  COGNITION: Overall cognitive status: Within functional limits for tasks assessed     SENSATION: WFL  POSTURE:  No Significant postural limitations  UPPER EXTREMITY MMT:  MMT Right eval Left eval  Shoulder flexion 5 5  Shoulder extension    Shoulder abduction 5 5  Shoulder internal rotation 5 5  Shoulder external rotation 5 5  Elbow flexion 5 5  Elbow extension 5 5  Wrist flexion 5 5  Wrist extension 5 5  Wrist pronation 5 5  Wrist supination 5 5  Grip strength fair good   (Blank rows = not tested)   LOWER EXTREMITY MMT:  MMT Right eval Left eval  Hip flexion 4 4+  Hip extension 4 4+  Hip abduction 4+ 4+  Hip adduction 4 4  Knee flexion 4 4+  Knee extension 5 5  Ankle dorsiflexion 4+ 5  Ankle plantarflexion 5 5   (Blank rows = not tested)  UPPER EXTREMITY MMT:  MMT Right 11/30/22 Left 11/30/22  Grip strength 21# 13#     COORDINATION:  Normal UE and LE - Repeated heel to shin, toe taps, alternating arm supination/pronation, thumb to fingers, all normal.  FUNCTIONAL TESTS:  5 times sit to stand: 11 seconds without UE assist Functional gait assessment: 26/30 Tandem 30 sec each side, single leg stance 6 sec each side   GAIT: Distance walked: 250' Assistive device utilized: None Level of assistance: Complete Independence Comments: no deviation   TODAY'S TREATMENT:                                                                                                                              DATE:   12/08/22 Therapeutic Exercise: to improve strength and mobility.  Demo, verbal and tactile cues throughout for technique. L6 x 6 min  Side lunges with foot on towel x  10 bil  Back lunges with foot on towel x 10 bil  Forward lunges with foot on towel 2 x 10 bil  Neuromuscular Reeducation: to improve balance and stability. SBA for safety throughout.  Star excursion pattern 360' x 3 each side - SBA for safety In corner: SLS - R average 6 sec,  L average 7 sec, improved after cues for posture and core engagement, able to maintain R SLS x 20 sec.  On airex: Eyes open feet semi tandem x 30 sec  Eyes open feet semi tandem  with head nods x 10 Eyes open feet semi tandem  with head turns x 10 Eyes closed feet semi tandem x 30 sec Eyes closed feet semi tandem with head nods x 10 Eyes closed feet semi tandem with head turns x 10   11/30/22 THERAPEUTIC EXERCISE: to improve flexibility, strength and mobility.  Verbal and tactile cues throughout for technique. NuStep - L5 x 6 min Red Theraputty: Gross grip squeeze Finger-thumb opposition 3-point pinch & pull Kep grip pinch R/L SLS + opp LE RTB 4-way SLR x 10 each direction NEUROMUSCULAR RE-EDUCATION: To improve balance, proprioception, and reduce fall risk. Corner balance: Tandem stance  EO x 30 sec EC x 30 sec Tandem stance on Airex EO x 30 sec Head turns x 5 Head nods x 5  EC x 30 sec THERAPEUTIC ACTIVITIES: Grip strength (see above)   11/24/2022 Self Care: see patient education.     PATIENT EDUCATION:  Education details: HEP update  Person educated: Patient Education method: Explanation, Demonstration, and Handouts Education comprehension: verbalized understanding, returned demonstration, and needs further education  HOME EXERCISE PROGRAM: Access Code: [email or web address] URL: [email or web address] Date: 12/08/2022 Prepared by: Glenetta Hew  Exercises - Putty Squeezes  - 1 x daily - 7 x weekly - 2 sets - 10 reps - 3 sec hold - Key Pinch with Putty  - 1 x daily - 7 x weekly - 2 sets - 10 reps - 3 sec hold - Seated Three Finger Pinch with Putty  - 1 x daily - 7 x weekly - 2 sets -  10 reps - 3 sec hold - Finger Pinch and Pull with Putty  - 1 x daily - 7 x weekly - 2 sets - 10 reps -  3 sec hold - Standing Hip Flexion with Anchored Resistance and Chair Support  - 1 x daily - 3-4 x weekly - 2 sets - 10 reps - 3 hold - Standing Hip Adduction with Anchored Resistance  - 1 x daily - 3-4 x weekly - 2 sets - 10 reps - 3 hold - Standing Hip Extension with Anchored Resistance  - 1 x daily - 3-4 x weekly - 2 sets - 10 reps - 3 hold - Standing Hip Abduction with Anchored Resistance  - 1 x daily - 3-4 x weekly - 2 sets - 10 reps - 3 hold - Half Tandem Stance on Foam Pad  - 1 x daily - 3-4 x weekly - 2 sets - 3 reps - 30 sec hold - Half Tandem Stance with Head Rotation on Foam Pad  - 1 x daily - 3-4 x weekly - 2 sets - 5 reps - Half Tandem Stance with Head Nods on Foam Pad  - 1 x daily - 3-4 x weekly - 2 sets - 5 reps - Single Leg Stance in Corner  - 1 x daily - 7 x weekly - 1 sets - 2 reps - 30 sec  hold - 3-Way Lunge on Slider  - 1 x daily - 7 x weekly - 2-3 sets - 10 reps    ASSESSMENT:  CLINICAL IMPRESSION: Jill Stopka reports good compliance with HEP, no pain and no new falls or concerns.   Today focused on LE strengthening exercises for stabilization to improve SLS and balance.  Still having difficulty with SLS, improved after cues for core engagement, added to HEP.  Tactile cues needed for balance with eyes closed on compliance surface in semitandem with horizontal head movements as had increased sway.  Felica Chargois continues to demonstrate potential for improvement and would benefit from continued skilled therapy to address impairments.     OBJECTIVE IMPAIRMENTS: decreased balance, decreased endurance, difficulty walking, and decreased strength.   ACTIVITY LIMITATIONS: locomotion level  PARTICIPATION LIMITATIONS: driving and occupation  PERSONAL FACTORS: Age and Fitness are also affecting patient's functional outcome.   REHAB POTENTIAL: Good  CLINICAL DECISION  MAKING: Stable/uncomplicated  EVALUATION COMPLEXITY: Low   GOALS: Goals reviewed with patient? Yes  SHORT TERM GOALS: Target date: 12/08/2022   Patient will be independent with initial HEP. Baseline: given 11/30/22 Goal status: MET 12/08/22   LONG TERM GOALS: Target date: 01/05/2023   Patient will be independent with advanced/ongoing HEP to improve outcomes and carryover.  Baseline:  Goal status: IN PROGRESS  2.  Patient will score 30/30 on FGA to demonstrate improved dynamic balance and no fall risk.   Baseline: 26/30 Goal status: IN PROGRESS  3.  Patient will be able to maintain SLS x 30 seconds to decrease fall risk.  Baseline: 6 seconds Goal status: IN PROGRESS   4.  Patient will demonstrate 5/5 bil LE strength.  Baseline: RLE weakness compared to LLE Goal status: IN PROGRESS  5.  Patient will report >60/80 on LEFS to demonstrate improved functional ability. Baseline: 50/80 Goal status: IN PROGRESS  6.  Patient will demonstrate improved R grip strength by at least 10 pounds.  Baseline: 21# Goal status: IN PROGRESS   PLAN:  PT FREQUENCY: 2x/week  PT DURATION: 6 weeks  PLANNED INTERVENTIONS: Therapeutic exercises, Therapeutic activity, Neuromuscular re-education, Balance training, Gait training, Patient/Family education, Self Care, Stair training, and Re-evaluation  PLAN FOR NEXT SESSION: Review initial HEP, focus higher level balance  - single  leg, tandem, etc.    Rennie Natter, PT 12/08/2022, 5:50 PM

## 2022-12-09 NOTE — Therapy (Incomplete)
OUTPATIENT PHYSICAL THERAPY TREATMENT   Patient Name: Joy Shelton MRN: [id number] DOB:1961-05-26, 62 y.o., female Today's Date: 12/08/2022  END OF SESSION:  PT End of Session - 12/08/22 1703     Visit Number 3    Number of Visits 12    Date for PT Re-Evaluation 01/05/23    Authorization Type UHC [id number]    PT Start Time 1703    PT Stop Time 1744    PT Time Calculation (min) 41 min    Activity Tolerance Patient tolerated treatment well    Behavior During Therapy WFL for tasks assessed/performed              Past Medical History:  Diagnosis Date   Hypercholesteremia    History reviewed. No pertinent surgical history. Patient Active Problem List   Diagnosis Date Noted   Lacunar infarct, acute ([place]) 11/13/2022   CVA (cerebral vascular accident) ([place]) 11/09/2022   Hyperlipidemia 11/09/2022   Obesity (BMI 30-39.9) 11/09/2022    PCP: Harlan Stains, MD  REFERRING PROVIDER: Izora Ribas, MD  REFERRING DIAG: I63.81 CVA   THERAPY DIAG:  Muscle weakness (generalized)  Unsteadiness on feet  Lacunar infarct, acute ([place])  Rationale for Evaluation and Treatment: Rehabilitation  ONSET DATE: 11/08/2022   SUBJECTIVE:   SUBJECTIVE STATEMENT: ***  PERTINENT HISTORY: CVA- lacunar infarct pons 11/08/22 PAIN:  Are you having pain? No   PRECAUTIONS: None  WEIGHT BEARING RESTRICTIONS: No  FALLS:  Has patient fallen in last 6 months? 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    NEXT MD VISIT: 12/25/22 - hospital follow-up, 12/31/22- neurologist   OBJECTIVE:   DIAGNOSTIC FINDINGS: MRI Brain 11/09/22 IMPRESSION: 1. 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 No  associated hemorrhage or mass effect.   2. Elsewhere stable and normal for age MRI appearance of the brain.  PATIENT SURVEYS:  Stroke Impact Scale 20/80 LEFS 50/80 = 62.5%  COGNITION: Overall cognitive status: Within functional limits for tasks assessed     SENSATION: WFL  POSTURE:  No Significant postural limitations  UPPER EXTREMITY MMT:  MMT Right eval Left eval  Shoulder flexion 5 5  Shoulder extension    Shoulder abduction 5 5  Shoulder internal rotation 5 5  Shoulder external rotation 5 5  Elbow flexion 5 5  Elbow extension 5 5  Wrist flexion 5 5  Wrist extension 5 5  Wrist pronation 5 5  Wrist supination 5 5  Grip strength fair good   (Blank rows = not tested)   LOWER EXTREMITY MMT:  MMT Right eval Left eval  Hip flexion 4 4+  Hip extension 4 4+  Hip abduction 4+ 4+  Hip adduction 4 4  Knee flexion 4 4+  Knee extension 5 5  Ankle dorsiflexion 4+ 5  Ankle plantarflexion 5 5   (Blank rows = not tested)  UPPER EXTREMITY MMT:  MMT Right 11/30/22 Left 11/30/22  Grip strength 21# 13#     COORDINATION:  Normal UE and LE - Repeated heel to shin, toe taps, alternating arm supination/pronation, thumb to fingers, all normal.   FUNCTIONAL TESTS:  5 times sit to stand: 11 seconds without UE assist Functional gait assessment: 26/30 Tandem 30 sec  each side, single leg stance 6 sec each side   GAIT: Distance walked: 250' Assistive device utilized: None Level of assistance: Complete Independence Comments: no deviation   TODAY'S TREATMENT:                                                                                                                              DATE:   12/10/22 Therapeutic Exercise: to improve strength and mobility.  Demo, verbal and tactile cues throughout for technique. Nustep L6 x 6 min  Side lunges with foot on towel x 10 bil  Back lunges with foot on towel x 10 bil  Forward lunges with foot on towel 2 x 10 bil   Neuromuscular  Reeducation: to improve balance and stability. SBA for safety throughout.  Star excursion pattern 360' x 3 each side - SBA for safety In corner: SLS - R average 6 sec,  L average 7 sec, improved after cues for posture and core engagement, able to maintain R SLS x 20 sec.  On airex: Eyes open feet semi tandem x 30 sec  Eyes open feet semi tandem  with head nods x 10 Eyes open feet semi tandem  with head turns x 10 Eyes closed feet semi tandem x 30 sec Eyes closed feet semi tandem with head nods x 10 Eyes closed feet semi tandem with head turns x 10    12/08/22 Therapeutic Exercise: to improve strength and mobility.  Demo, verbal and tactile cues throughout for technique. L6 x 6 min  Side lunges with foot on towel x 10 bil  Back lunges with foot on towel x 10 bil  Forward lunges with foot on towel 2 x 10 bil  Neuromuscular Reeducation: to improve balance and stability. SBA for safety throughout.  Star excursion pattern 360' x 3 each side - SBA for safety In corner: SLS - R average 6 sec,  L average 7 sec, improved after cues for posture and core engagement, able to maintain R SLS x 20 sec.  On airex: Eyes open feet semi tandem x 30 sec  Eyes open feet semi tandem  with head nods x 10 Eyes open feet semi tandem  with head turns x 10 Eyes closed feet semi tandem x 30 sec Eyes closed feet semi tandem with head nods x 10 Eyes closed feet semi tandem with head turns x 10   11/30/22 THERAPEUTIC EXERCISE: to improve flexibility, strength and mobility.  Verbal and tactile cues throughout for technique. NuStep - L5 x 6 min Red Theraputty: Gross grip squeeze Finger-thumb opposition 3-point pinch & pull Kep grip pinch R/L SLS + opp LE RTB 4-way SLR x 10 each direction NEUROMUSCULAR RE-EDUCATION: To improve balance, proprioception, and reduce fall risk. Corner balance: Tandem stance  EO x 30 sec EC x 30 sec Tandem stance on Airex EO x 30 sec Head turns x 5 Head nods x 5  EC x 30  sec THERAPEUTIC ACTIVITIES: Grip  strength (see above)   11/24/2022 Self Care: see patient education.     PATIENT EDUCATION:  Education details: HEP update  Person educated: Patient Education method: Explanation, Demonstration, and Handouts Education comprehension: verbalized understanding, returned demonstration, and needs further education  HOME EXERCISE PROGRAM: Access Code: [email or web address] URL: [email or web address] Date: 12/08/2022 Prepared by: Harrie Foreman  Exercises - Putty Squeezes  - 1 x daily - 7 x weekly - 2 sets - 10 reps - 3 sec hold - Key Pinch with Putty  - 1 x daily - 7 x weekly - 2 sets - 10 reps - 3 sec hold - Seated Three Finger Pinch with Putty  - 1 x daily - 7 x weekly - 2 sets - 10 reps - 3 sec hold - Finger Pinch and Pull with Putty  - 1 x daily - 7 x weekly - 2 sets - 10 reps - 3 sec hold - Standing Hip Flexion with Anchored Resistance and Chair Support  - 1 x daily - 3-4 x weekly - 2 sets - 10 reps - 3 hold - Standing Hip Adduction with Anchored Resistance  - 1 x daily - 3-4 x weekly - 2 sets - 10 reps - 3 hold - Standing Hip Extension with Anchored Resistance  - 1 x daily - 3-4 x weekly - 2 sets - 10 reps - 3 hold - Standing Hip Abduction with Anchored Resistance  - 1 x daily - 3-4 x weekly - 2 sets - 10 reps - 3 hold - Half Tandem Stance on Foam Pad  - 1 x daily - 3-4 x weekly - 2 sets - 3 reps - 30 sec hold - Half Tandem Stance with Head Rotation on Foam Pad  - 1 x daily - 3-4 x weekly - 2 sets - 5 reps - Half Tandem Stance with Head Nods on Foam Pad  - 1 x daily - 3-4 x weekly - 2 sets - 5 reps - Single Leg Stance in Corner  - 1 x daily - 7 x weekly - 1 sets - 2 reps - 30 sec  hold - 3-Way Lunge on Slider  - 1 x daily - 7 x weekly - 2-3 sets - 10 reps    ASSESSMENT:  CLINICAL IMPRESSION: ***     OBJECTIVE IMPAIRMENTS: decreased balance, decreased endurance, difficulty walking, and decreased strength.   ACTIVITY LIMITATIONS:  locomotion level  PARTICIPATION LIMITATIONS: driving and occupation  PERSONAL FACTORS: Age and Fitness are also affecting patient's functional outcome.   REHAB POTENTIAL: Good  CLINICAL DECISION MAKING: Stable/uncomplicated  EVALUATION COMPLEXITY: Low   GOALS: Goals reviewed with patient? Yes  SHORT TERM GOALS: Target date: 12/08/2022   Patient will be independent with initial HEP. Baseline: given 11/30/22 Goal status: MET 12/08/22   LONG TERM GOALS: Target date: 01/05/2023   Patient will be independent with advanced/ongoing HEP to improve outcomes and carryover.  Baseline:  Goal status: IN PROGRESS  2.  Patient will score 30/30 on FGA to demonstrate improved dynamic balance and no fall risk.   Baseline: 26/30 Goal status: IN PROGRESS  3.  Patient will be able to maintain SLS x 30 seconds to decrease fall risk.  Baseline: 6 seconds Goal status: IN PROGRESS   4.  Patient will demonstrate 5/5 bil LE strength.  Baseline: RLE weakness compared to LLE Goal status: IN PROGRESS  5.  Patient will report >60/80 on LEFS to demonstrate improved functional ability. Baseline: 50/80 Goal status: IN PROGRESS  6.  Patient will demonstrate improved R grip strength by at least 10 pounds.  Baseline: 21# Goal status: IN PROGRESS   PLAN:  PT FREQUENCY: 2x/week  PT DURATION: 6 weeks  PLANNED INTERVENTIONS: Therapeutic exercises, Therapeutic activity, Neuromuscular re-education, Balance training, Gait training, Patient/Family education, Self Care, Stair training, and Re-evaluation  PLAN FOR NEXT SESSION: Review initial HEP, focus higher level balance  - single leg, tandem, etc.    Rennie Natter, PT 12/08/2022, 5:50 PM

## 2022-12-10 ENCOUNTER — Ambulatory Visit: Payer: 59 | Attending: Physical Medicine and Rehabilitation | Admitting: Physical Therapy

## 2022-12-10 ENCOUNTER — Telehealth: Payer: Self-pay | Admitting: Physical Medicine and Rehabilitation

## 2022-12-10 DIAGNOSIS — R2681 Unsteadiness on feet: Secondary | ICD-10-CM | POA: Insufficient documentation

## 2022-12-10 DIAGNOSIS — I6381 Other cerebral infarction due to occlusion or stenosis of small artery: Secondary | ICD-10-CM | POA: Insufficient documentation

## 2022-12-10 DIAGNOSIS — M6281 Muscle weakness (generalized): Secondary | ICD-10-CM | POA: Insufficient documentation

## 2022-12-10 NOTE — Telephone Encounter (Signed)
Patient needs a call back, she has questions about her Plavix.  Please call her at [phone].

## 2022-12-11 ENCOUNTER — Encounter: Payer: 59 | Attending: Physical Medicine and Rehabilitation | Admitting: Physical Medicine and Rehabilitation

## 2022-12-11 ENCOUNTER — Other Ambulatory Visit: Payer: Self-pay | Admitting: Physician Assistant

## 2022-12-11 DIAGNOSIS — R400 Somnolence: Secondary | ICD-10-CM | POA: Insufficient documentation

## 2022-12-11 DIAGNOSIS — I639 Cerebral infarction, unspecified: Secondary | ICD-10-CM

## 2022-12-11 DIAGNOSIS — R200 Anesthesia of skin: Secondary | ICD-10-CM | POA: Insufficient documentation

## 2022-12-11 DIAGNOSIS — R252 Cramp and spasm: Secondary | ICD-10-CM | POA: Insufficient documentation

## 2022-12-11 DIAGNOSIS — R3915 Urgency of urination: Secondary | ICD-10-CM | POA: Insufficient documentation

## 2022-12-11 NOTE — Progress Notes (Signed)
   Subjective:    Patient ID: Joy Shelton, female    DOB: [birth year], 62 y.o.   MRN: [id number]  HPI An audio/video tele-health visit is felt to be the most appropriate encounter for this patient at this time. This is a follow up tele-visit via phone. The patient is at home. MD is at office. Prior to scheduling this appointment, our staff discussed the limitations of evaluation and management by telemedicine and the availability of in-person appointments. The patient expressed understanding and agreed to proceed.   1) CVA -Mrs. Joy Shelton is a 62 year old woman who presents for hospital follow-up of CVA -notes intermittent episodes of numbness and visual deficits -she is worried about new stroke -episodes self resolve and appear to be worst at night -she is not sure if these are related to anxiety     Review of Systems     Objective:   Physical Exam   Not performed     Assessment & Plan:   1) CVA -discussed getting Head CT to r/o stroke -discussed could be related to anxiety -we will review Head CT results together and determine plan -discussed that Plavix can be stopped after 21 days and that aspirin should be continued  -discussed that she had to reschedule her outpatient neuro f/u  9 minutes spent in discussion of her intermittent episodes of numbness and tingling that are worse at night

## 2022-12-13 ENCOUNTER — Emergency Department (HOSPITAL_BASED_OUTPATIENT_CLINIC_OR_DEPARTMENT_OTHER)
Admission: EM | Admit: 2022-12-13 | Discharge: 2022-12-13 | Disposition: A | Discharge status: Home / Self Care | Payer: 59 | Attending: Emergency Medicine | Admitting: Emergency Medicine

## 2022-12-13 ENCOUNTER — Emergency Department (HOSPITAL_BASED_OUTPATIENT_CLINIC_OR_DEPARTMENT_OTHER): Payer: 59

## 2022-12-13 ENCOUNTER — Encounter (HOSPITAL_BASED_OUTPATIENT_CLINIC_OR_DEPARTMENT_OTHER): Payer: Self-pay | Admitting: Emergency Medicine

## 2022-12-13 ENCOUNTER — Other Ambulatory Visit: Payer: Self-pay

## 2022-12-13 DIAGNOSIS — R202 Paresthesia of skin: Secondary | ICD-10-CM | POA: Diagnosis not present

## 2022-12-13 DIAGNOSIS — R42 Dizziness and giddiness: Secondary | ICD-10-CM

## 2022-12-13 DIAGNOSIS — Z7982 Long term (current) use of aspirin: Secondary | ICD-10-CM | POA: Diagnosis not present

## 2022-12-13 LAB — COMPREHENSIVE METABOLIC PANEL
ALT: 24 U/L (ref 0–44)
AST: 21 U/L (ref 15–41)
Albumin: 4 g/dL (ref 3.5–5.0)
Alkaline Phosphatase: 70 U/L (ref 38–126)
Anion gap: 6 (ref 5–15)
BUN: 10 mg/dL (ref 6–20)
CO2: 27 mmol/L (ref 22–32)
Calcium: 8.9 mg/dL (ref 8.9–10.3)
Chloride: 107 mmol/L (ref 98–111)
Creatinine, Ser: 0.82 mg/dL (ref 0.44–1.00)
GFR, Estimated: >60 mL/min (ref >60)
Glucose, Bld: 118 mg/dL — ABNORMAL HIGH (ref 70–99)
Potassium: 3.7 mmol/L (ref 3.5–5.1)
Sodium: 140 mmol/L (ref 135–145)
Total Bilirubin: 0.7 mg/dL (ref 0.3–1.2)
Total Protein: 7.3 g/dL (ref 6.5–8.1)

## 2022-12-13 LAB — DIFFERENTIAL
Abs Immature Granulocytes: 0.01 10*3/uL (ref 0.00–0.07)
Basophils Absolute: 0 10*3/uL (ref 0.0–0.1)
Basophils Relative: 1 %
Eosinophils Absolute: 0.1 10*3/uL (ref 0.0–0.5)
Eosinophils Relative: 2 %
Immature Granulocytes: 0 %
Lymphocytes Relative: 19 %
Lymphs Abs: 1.1 10*3/uL (ref 0.7–4.0)
Monocytes Absolute: 0.5 10*3/uL (ref 0.1–1.0)
Monocytes Relative: 8 %
Neutro Abs: 4.2 10*3/uL (ref 1.7–7.7)
Neutrophils Relative %: 70 %

## 2022-12-13 LAB — RAPID URINE DRUG SCREEN, HOSP PERFORMED
Amphetamines: NOT DETECTED
Barbiturates: NOT DETECTED
Benzodiazepines: NOT DETECTED
Cocaine: NOT DETECTED
Opiates: NOT DETECTED
Tetrahydrocannabinol: NOT DETECTED

## 2022-12-13 LAB — URINALYSIS, ROUTINE W REFLEX MICROSCOPIC
Bilirubin Urine: NEGATIVE
Glucose, UA: NEGATIVE mg/dL
Hgb urine dipstick: NEGATIVE
Ketones, ur: NEGATIVE mg/dL
Leukocytes,Ua: NEGATIVE
Nitrite: NEGATIVE
Protein, ur: NEGATIVE mg/dL
Specific Gravity, Urine: 1.015 (ref 1.005–1.030)
pH: 5.5 (ref 5.0–8.0)

## 2022-12-13 LAB — CBC
HCT: 43.1 % (ref 36.0–46.0)
Hemoglobin: 14.1 g/dL (ref 12.0–15.0)
MCH: 28.1 pg (ref 26.0–34.0)
MCHC: 32.7 g/dL (ref 30.0–36.0)
MCV: 86 fL (ref 80.0–100.0)
Platelets: 262 10*3/uL (ref 150–400)
RBC: 5.01 MIL/uL (ref 3.87–5.11)
RDW: 13.3 % (ref 11.5–15.5)
WBC: 6 10*3/uL (ref 4.0–10.5)
nRBC: 0 % (ref 0.0–0.2)

## 2022-12-13 LAB — ETHANOL: Alcohol, Ethyl (B): <10 mg/dL (ref <10)

## 2022-12-13 LAB — PROTIME-INR
INR: 1 (ref 0.8–1.2)
Prothrombin Time: 13.3 seconds (ref 11.4–15.2)

## 2022-12-13 LAB — APTT: aPTT: 24 seconds (ref 24–36)

## 2022-12-13 LAB — CBG MONITORING, ED: Glucose-Capillary: 111 mg/dL — ABNORMAL HIGH (ref 70–99)

## 2022-12-13 NOTE — ED Triage Notes (Signed)
Pt reports she had tingling in R hand and mouth and dizziness (room spinning sensation) at 0600 for a few minutes. Pt says she had a stroke a month ago. No changes in speech at time. Denies any symptoms at present time. Just finished plavix yesterday.

## 2022-12-13 NOTE — Discharge Instructions (Signed)
You were seen in the emergency department for dizziness and right hand and face tingling.  You had blood work and a CAT scan that did not show any evidence of acute findings.  Please continue your regular medications and follow-up with your neurologist.  Return to the emergency department if any worsening or concerning symptoms.

## 2022-12-13 NOTE — ED Notes (Signed)
ED Provider at bedside. 

## 2022-12-13 NOTE — ED Provider Notes (Signed)
[place] [facility] Provider Note   CSN: [id number] Arrival date & time: 12/13/22  9323     History  Chief Complaint  Patient presents with   Dizziness   Tingling    Joy Shelton is a 62 y.o. female.  She has a recent history of stroke.  She said she woke up this morning felt hot had to pull off the covers.  She noticed that she felt dizzy and the room was spinning.  She had some tingling in her right hand and around her lips.  Symptoms lasted about 5 minutes and have resolved.  Since her stroke last months she has had some intermittent tingling in her hand.  She also said her urine looked dark this morning.  No recent illness feels she has been hydrating well.  Just stopped Plavix per neurology recommendations.  Continues on aspirin.  Feels little bit tired now but otherwise back to baseline.  The history is provided by the patient.  Dizziness Quality:  Room spinning Progression:  Resolved Chronicity:  New Relieved by:  Lying down Worsened by:  Nothing Ineffective treatments:  None tried Associated symptoms: headaches   Associated symptoms: no chest pain, no nausea, no shortness of breath, no vision changes, no vomiting and no weakness        Home Medications Prior to Admission medications   Medication Sig Start Date End Date Taking? Authorizing Provider  acetaminophen (TYLENOL) 325 MG tablet Take 1-2 tablets (325-650 mg total) by mouth every 4 (four) hours as needed for mild pain. 11/20/22   Setzer, Edman Circle, PA-C  aspirin EC 81 MG tablet Take 1 tablet (81 mg total) by mouth daily. Swallow whole. 11/20/22   Setzer, Edman Circle, PA-C  calcium carbonate (TUMS - DOSED IN MG ELEMENTAL CALCIUM) 500 MG chewable tablet Chew 1 tablet (500 mg total) by mouth daily after supper. 11/20/22   Setzer, Edman Circle, PA-C  magnesium gluconate (MAGONATE) 500 MG tablet Take 0.5 tablets (250 mg total) by mouth at bedtime. 11/20/22   Setzer, Edman Circle, PA-C   pantoprazole (PROTONIX) 40 MG tablet Take 1 tablet (40 mg total) by mouth daily at 6 (six) AM. 11/20/22   Setzer, Edman Circle, PA-C  rosuvastatin (CRESTOR) 40 MG tablet Take 1 tablet (40 mg total) by mouth daily. 11/20/22   Setzer, Edman Circle, PA-C  saccharomyces boulardii (FLORASTOR) 250 MG capsule Take 1 capsule (250 mg total) by mouth 2 (two) times daily. 11/20/22   Setzer, Edman Circle, PA-C  Vitamin D, Ergocalciferol, (DRISDOL) 1.25 MG (50000 UNIT) CAPS capsule Take 1 capsule (50,000 Units total) by mouth every 7 (seven) days. Each Wednesday. 11/25/22   Setzer, Edman Circle, PA-C  witch hazel-glycerin (TUCKS) pad Apply topically as needed for itching. 11/20/22   Setzer, Edman Circle, PA-C      Allergies    Lipitor [atorvastatin]    Review of Systems   Review of Systems  Constitutional:  Negative for fever.  HENT:  Negative for sore throat.   Eyes:  Negative for visual disturbance.  Respiratory:  Negative for shortness of breath.   Cardiovascular:  Negative for chest pain.  Gastrointestinal:  Negative for abdominal pain, nausea and vomiting.  Genitourinary:  Negative for dysuria.  Skin:  Negative for rash.  Neurological:  Positive for dizziness, numbness and headaches. Negative for weakness.    Physical Exam Updated Vital Signs BP 115/79   Pulse 80   Temp 98 F (36.7 C) (Oral)   Resp  16   Ht 5\' 2"  (1.575 m)   Wt 81.2 kg   SpO2 97%   BMI 32.74 kg/m  Physical Exam Vitals and nursing note reviewed.  Constitutional:      General: She is not in acute distress.    Appearance: Normal appearance. She is well-developed.  HENT:     Head: Normocephalic and atraumatic.  Eyes:     Conjunctiva/sclera: Conjunctivae normal.  Cardiovascular:     Rate and Rhythm: Normal rate and regular rhythm.     Heart sounds: No murmur heard. Pulmonary:     Effort: Pulmonary effort is normal. No respiratory distress.     Breath sounds: Normal breath sounds.  Abdominal:     Palpations: Abdomen is soft.      Tenderness: There is no abdominal tenderness.  Musculoskeletal:        General: No deformity.     Cervical back: Neck supple.  Skin:    General: Skin is warm and dry.     Capillary Refill: Capillary refill takes less than 2 seconds.  Neurological:     General: No focal deficit present.     Mental Status: She is alert and oriented to person, place, and time.     Cranial Nerves: No cranial nerve deficit.     Sensory: No sensory deficit.     Motor: No weakness.     Gait: Gait normal.     ED Results / Procedures / Treatments   Labs (all labs ordered are listed, but only abnormal results are displayed) Labs Reviewed  COMPREHENSIVE METABOLIC PANEL - Abnormal; Notable for the following components:      Result Value   Glucose, Bld 118 (*)    All other components within normal limits  CBG MONITORING, ED - Abnormal; Notable for the following components:   Glucose-Capillary 111 (*)    All other components within normal limits  ETHANOL  PROTIME-INR  APTT  CBC  DIFFERENTIAL  URINALYSIS, ROUTINE W REFLEX MICROSCOPIC  RAPID URINE DRUG SCREEN, HOSP PERFORMED    EKG EKG Interpretation  Date/Time:  Sunday December 13 2022 08:29:41 EST Ventricular Rate:  81 PR Interval:  155 QRS Duration: 81 QT Interval:  345 QTC Calculation: 401 R Axis:   -5 Text Interpretation: Sinus rhythm No significant change since prior 1/24 Confirmed by Aletta Edouard [phone]) on 12/13/2022 8:43:44 AM  Radiology CT Head Wo Contrast  Result Date: 12/13/2022 CLINICAL DATA:  Right hand in mouth EXAM: CT HEAD WITHOUT CONTRAST TECHNIQUE: Contiguous axial images were obtained from the base of the skull through the vertex without intravenous contrast. RADIATION DOSE REDUCTION: This exam was performed according to the departmental dose-optimization program which includes automated exposure control, adjustment of the mA and/or kV according to patient size and/or use of iterative reconstruction technique. COMPARISON:   12/02/2022 brain MRI FINDINGS: Brain: Known recent pontine infarcts are occult. No hemorrhage or visible progression. No masslike finding, hydrocephalus, or collection. Vascular: No hyperdense vessel or unexpected calcification. Skull: Normal. Negative for fracture or focal lesion. Sinuses/Orbits: No acute finding. IMPRESSION: No acute finding.  Recent pontine infarcts by MRI are occult today. Electronically Signed   By: Jorje Guild M.D.   On: 12/13/2022 09:13    Procedures Procedures    Medications Ordered in ED Medications - No data to display  ED Course/ Medical Decision Making/ A&P Clinical Course as of 12/13/22 1727  Sun Dec 13, 2022  0923 Reviewed case with Dr. Quinn Axe neurology.  She did not feel that  the patient needed an MRI at this point with her symptoms resolved.  She said she is optimized as far as her stroke workup.  Does not need to restart Plavix.  Can continue her aspirin and follow-up with neurology. [MB]    Clinical Course User Index [MB] Hayden Rasmussen, MD                             Medical Decision Making Amount and/or Complexity of Data Reviewed Labs: ordered. Radiology: ordered.   This patient complains of dizziness tingling in right hand; this involves an extensive number of treatment Options and is a complaint that carries with it a high risk of complications and morbidity. The differential includes stroke, vertigo, near syncope, anxiety, hypoglycemia, metabolic derangement  I ordered, reviewed and interpreted labs, which included CBC normal, chemistries and LFTs normal other than mildly elevated glucose, urinalysis without signs of infection, tox screen negative I ordered imaging studies which included CT head and I independently    visualized and interpreted imaging which showed no acute findings Additional history obtained from patient's husband Previous records obtained and reviewed in epic including prior neurology notes I consulted neurology Dr.  Quinn Axe and discussed lab and imaging findings and discussed disposition.  Cardiac monitoring reviewed, normal sinus rhythm Social determinants considered, no significant barriers Critical Interventions: None  After the interventions stated above, I reevaluated the patient and found patient to have a normal neuroexam and in no distress Admission and further testing considered, no indications for admission or further workup at this time.  Recommended follow-up with outpatient neurologist per neurology recommendations.  No indications for other acute imaging at this time.  Return instructions discussed         Final Clinical Impression(s) / ED Diagnoses Final diagnoses:  Dizziness  Right hand paresthesia    Rx / DC Orders ED Discharge Orders     None         Hayden Rasmussen, MD 12/13/22 1729

## 2022-12-14 ENCOUNTER — Encounter: Payer: Self-pay | Admitting: Physical Medicine and Rehabilitation

## 2022-12-14 ENCOUNTER — Encounter: Payer: Self-pay | Admitting: Neurology

## 2022-12-15 ENCOUNTER — Encounter: Payer: Self-pay | Admitting: Physical Therapy

## 2022-12-15 ENCOUNTER — Encounter: Payer: Self-pay | Admitting: Physical Medicine and Rehabilitation

## 2022-12-15 ENCOUNTER — Ambulatory Visit: Payer: 59 | Admitting: Physical Therapy

## 2022-12-15 DIAGNOSIS — R2681 Unsteadiness on feet: Secondary | ICD-10-CM

## 2022-12-15 DIAGNOSIS — M6281 Muscle weakness (generalized): Secondary | ICD-10-CM

## 2022-12-15 DIAGNOSIS — I6381 Other cerebral infarction due to occlusion or stenosis of small artery: Secondary | ICD-10-CM | POA: Diagnosis present

## 2022-12-15 NOTE — Therapy (Signed)
OUTPATIENT PHYSICAL THERAPY TREATMENT   Patient Name: Joy Shelton MRN: [id number] DOB:1961-10-14, 62 y.o., female Today's Date: 12/15/2022  END OF SESSION:  PT End of Session - 12/15/22 0854     Visit Number 4    Number of Visits 12    Date for PT Re-Evaluation 01/05/23    Authorization Type UHC [id number]    PT Start Time 8841    PT Stop Time 0930    PT Time Calculation (min) 35 min    Activity Tolerance Patient tolerated treatment well    Behavior During Therapy [facility] for tasks assessed/performed              Past Medical History:  Diagnosis Date   Hypercholesteremia    Stroke ([place]) 11/09/2022   History reviewed. No pertinent surgical history. Patient Active Problem List   Diagnosis Date Noted   Lacunar infarct, acute ([place]) 11/13/2022   CVA (cerebral vascular accident) ([place]) 11/09/2022   Hyperlipidemia 11/09/2022   Obesity (BMI 30-39.9) 11/09/2022    PCP: Harlan Stains, MD  REFERRING PROVIDER: Izora Ribas, MD  REFERRING DIAG: I63.81 CVA   THERAPY DIAG:  Muscle weakness (generalized)  Unsteadiness on feet  Lacunar infarct, acute ([place])  Rationale for Evaluation and Treatment: Rehabilitation  ONSET DATE: 11/08/2022   SUBJECTIVE:   SUBJECTIVE STATEMENT: Joy Shelton arrived almost 10 min late to session today.  Had appointment times mixed up last visit, came at 5 instead of 8:45.   Sunday she thought she had a TIA, got dizzy and went to the ED, new imaging was negative.     PERTINENT HISTORY: CVA- lacunar infarct pons 11/08/22 PAIN:  Are you having pain? No   PRECAUTIONS: None  WEIGHT BEARING RESTRICTIONS: No  FALLS:  Has patient fallen in last 6 months? 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    NEXT MD VISIT: 12/25/22 - hospital follow-up, 12/31/22- neurologist   OBJECTIVE:   DIAGNOSTIC FINDINGS: MRI Brain 11/09/22 IMPRESSION: 1. 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 No associated hemorrhage or mass effect.   2. Elsewhere stable and normal for age MRI appearance of the brain.  PATIENT SURVEYS:  Stroke Impact Scale 20/80 LEFS 50/80 = 62.5%  COGNITION: Overall cognitive status: Within functional limits for tasks assessed     SENSATION: WFL  POSTURE:  No Significant postural limitations  UPPER EXTREMITY MMT:  MMT Right eval Left eval  Shoulder flexion 5 5  Shoulder extension    Shoulder abduction 5 5  Shoulder internal rotation 5 5  Shoulder external rotation 5 5  Elbow flexion 5 5  Elbow extension 5 5  Wrist flexion 5 5  Wrist extension 5 5  Wrist pronation 5 5  Wrist supination 5 5  Grip strength fair good   (Blank rows = not tested)   LOWER EXTREMITY MMT:  MMT Right eval Left eval Right 12/15/2022 Left  12/15/22  Hip flexion 4 4+ 4+ 5  Hip extension 4 4+ 4 4+  Hip abduction 4+ 4+ 5 5  Hip adduction 4 4 5 5   Knee flexion 4 4+ 5 5  Knee extension 5 5 5 5   Ankle dorsiflexion 4+ 5 5 5   Ankle plantarflexion 5 5  5 5   (Blank rows = not tested)  UPPER EXTREMITY MMT:  MMT Right 11/30/22 Left 11/30/22 Right 12/15/22 Left 12/15/22  Grip strength 21# 13# 35# 25#     COORDINATION:  Normal UE and LE - Repeated heel to shin, toe taps, alternating arm supination/pronation, thumb to fingers, all normal.   FUNCTIONAL TESTS:  5 times sit to stand: 11 seconds without UE assist Functional gait assessment: 26/30 Tandem 30 sec each side, single leg stance 6 sec each side   GAIT: Distance walked: 250' Assistive device utilized: None Level of assistance: Complete Independence Comments: no deviation   TODAY'S TREATMENT:                                                                                                                               DATE:  12/15/22 Therapeutic Exercise: to improve strength and mobility.  Demo, verbal and tactile cues throughout for technique. Nustep L5 x 6 min  Single leg RDLs 2 x 5 bil  Squats 2 x 10  Prone hip extensions 2 x 10 bil  Quadruped hip extensions 2 x 5 bil  Bird dogs 2 x 10 bil  Neuromuscular Reeducation: to improve balance and stability. SBA for safety throughout.  SLS in corner 3 x 10 sec hold Tandem walking Therapeutic Activity:  MMT and grip strength   12/08/22 Therapeutic Exercise: to improve strength and mobility.  Demo, verbal and tactile cues throughout for technique. L6 x 6 min  Side lunges with foot on towel x 10 bil  Back lunges with foot on towel x 10 bil  Forward lunges with foot on towel 2 x 10 bil  Neuromuscular Reeducation: to improve balance and stability. SBA for safety throughout.  Star excursion pattern 360' x 3 each side - SBA for safety In corner: SLS - R average 6 sec,  L average 7 sec, improved after cues for posture and core engagement, able to maintain R SLS x 20 sec.  On airex: Eyes open feet semi tandem x 30 sec  Eyes open feet semi tandem  with head nods x 10 Eyes open feet semi tandem  with head turns x 10 Eyes closed feet semi tandem x 30 sec Eyes closed feet semi tandem with head nods x 10 Eyes closed feet semi tandem with head turns x 10   11/30/22 THERAPEUTIC EXERCISE: to improve flexibility, strength and mobility.  Verbal and tactile cues throughout for technique. NuStep - L5 x 6 min Red Theraputty: Gross grip squeeze Finger-thumb opposition 3-point pinch & pull Kep grip pinch R/L SLS + opp LE RTB 4-way SLR x 10 each direction NEUROMUSCULAR RE-EDUCATION: To improve balance, proprioception, and reduce fall risk. Corner balance: Tandem stance  EO x 30 sec EC x 30 sec Tandem stance on Airex EO x 30 sec Head turns x 5 Head nods x 5  EC x 30 sec THERAPEUTIC ACTIVITIES: Grip strength (see  above)   11/24/2022 Self  Care: see patient education.     PATIENT EDUCATION:  Education details: HEP update  Person educated: Patient Education method: Explanation, Demonstration, and Handouts Education comprehension: verbalized understanding, returned demonstration, and needs further education  HOME EXERCISE PROGRAM: Access Code: [email or web address] URL: [email or web address] Date: 12/15/2022 Prepared by: Glenetta Hew  Exercises - Prone Hip Extension with Plantarflexion  - 1 x daily - 7 x weekly - 2-3 sets - 10 reps - Bird Dog  - 1 x daily - 7 x weekly - 2-3 sets - 10 reps    ASSESSMENT:  CLINICAL IMPRESSION: Rubbie Goostree reported concerns about new TIA and going to ED on Sunday.  Reassessed MMT today, noted only improving with strength overall,  actually met LTG #6 for grip strength.  She did have a little more difficulty with SLS on L, but with cues for weight shift was able to maintain for 10 seconds.  Updated HEP to continue to improve hip extensor strength.   Mayo Owczarzak continues to demonstrate potential for improvement and would benefit from continued skilled therapy to address impairments.     OBJECTIVE IMPAIRMENTS: decreased balance, decreased endurance, difficulty walking, and decreased strength.   ACTIVITY LIMITATIONS: locomotion level  PARTICIPATION LIMITATIONS: driving and occupation  PERSONAL FACTORS: Age and Fitness are also affecting patient's functional outcome.   REHAB POTENTIAL: Good  CLINICAL DECISION MAKING: Stable/uncomplicated  EVALUATION COMPLEXITY: Low   GOALS: Goals reviewed with patient? Yes  SHORT TERM GOALS: Target date: 12/08/2022   Patient will be independent with initial HEP. Baseline: given 11/30/22 Goal status: MET 12/08/22   LONG TERM GOALS: Target date: 01/05/2023   Patient will be independent with advanced/ongoing HEP to improve outcomes and carryover.  Baseline:  Goal status: IN PROGRESS 12/15/22- advanced.    2.  Patient will score 30/30 on FGA to demonstrate improved dynamic balance and no fall risk.   Baseline: 26/30 Goal status: IN PROGRESS  3.  Patient will be able to maintain SLS x 30 seconds to decrease fall risk.  Baseline: 6 seconds Goal status: IN PROGRESS   4.  Patient will demonstrate 5/5 bil LE strength.  Baseline: RLE weakness compared to LLE Goal status: IN PROGRESS 12/15/22- improving, see objective.   5.  Patient will report >60/80 on LEFS to demonstrate improved functional ability. Baseline: 50/80 Goal status: IN PROGRESS  6.  Patient will demonstrate improved R grip strength by at least 10 pounds.  Baseline: 21# Goal status: MET 12/15/22 35#   PLAN:  PT FREQUENCY: 2x/week  PT DURATION: 6 weeks  PLANNED INTERVENTIONS: Therapeutic exercises, Therapeutic activity, Neuromuscular re-education, Balance training, Gait training, Patient/Family education, Self Care, Stair training, and Re-evaluation  PLAN FOR NEXT SESSION:  focus higher level balance  - single leg, tandem, etc. , hip extensor and flexor strengthening.    Rennie Natter, PT, DPT 12/15/2022, 10:00 AM

## 2022-12-17 ENCOUNTER — Ambulatory Visit: Payer: 59

## 2022-12-17 DIAGNOSIS — M6281 Muscle weakness (generalized): Secondary | ICD-10-CM | POA: Diagnosis not present

## 2022-12-17 DIAGNOSIS — R2681 Unsteadiness on feet: Secondary | ICD-10-CM

## 2022-12-17 DIAGNOSIS — I6381 Other cerebral infarction due to occlusion or stenosis of small artery: Secondary | ICD-10-CM

## 2022-12-17 NOTE — Therapy (Signed)
OUTPATIENT PHYSICAL THERAPY TREATMENT   Patient Name: Joy Shelton MRN: [id number] DOB:June 28, 1961, 62 y.o., female Today's Date: 12/17/2022  END OF SESSION:  PT End of Session - 12/17/22 0935     Visit Number 5    Number of Visits 12    Date for PT Re-Evaluation 01/05/23    Authorization Type UHC [id number]    PT Start Time 4098   pt late   PT Stop Time 0934    PT Time Calculation (min) 41 min    Activity Tolerance Patient tolerated treatment well    Behavior During Therapy [facility] for tasks assessed/performed              Past Medical History:  Diagnosis Date   Hypercholesteremia    Stroke ([place]) 11/09/2022   History reviewed. No pertinent surgical history. Patient Active Problem List   Diagnosis Date Noted   Lacunar infarct, acute ([place]) 11/13/2022   CVA (cerebral vascular accident) ([place]) 11/09/2022   Hyperlipidemia 11/09/2022   Obesity (BMI 30-39.9) 11/09/2022    PCP: Harlan Stains, MD  REFERRING PROVIDER: Izora Ribas, MD  REFERRING DIAG: I63.81 CVA   THERAPY DIAG:  Muscle weakness (generalized)  Unsteadiness on feet  Lacunar infarct, acute ([place])  Rationale for Evaluation and Treatment: Rehabilitation  ONSET DATE: 11/08/2022   SUBJECTIVE:   SUBJECTIVE STATEMENT:    Pt reports balance is improving which is what she wants to focus on the most.  PERTINENT HISTORY: CVA- lacunar infarct pons 11/08/22 PAIN:  Are you having pain? No   PRECAUTIONS: None  WEIGHT BEARING RESTRICTIONS: No  FALLS:  Has patient fallen in last 6 months? 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    NEXT MD VISIT: 12/25/22 - hospital follow-up, 12/31/22- neurologist   OBJECTIVE:   DIAGNOSTIC FINDINGS: MRI Brain  11/09/22 IMPRESSION: 1. 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 No associated hemorrhage or mass effect.   2. Elsewhere stable and normal for age MRI appearance of the brain.  PATIENT SURVEYS:  Stroke Impact Scale 20/80 LEFS 50/80 = 62.5%  COGNITION: Overall cognitive status: Within functional limits for tasks assessed     SENSATION: WFL  POSTURE:  No Significant postural limitations  UPPER EXTREMITY MMT:  MMT Right eval Left eval  Shoulder flexion 5 5  Shoulder extension    Shoulder abduction 5 5  Shoulder internal rotation 5 5  Shoulder external rotation 5 5  Elbow flexion 5 5  Elbow extension 5 5  Wrist flexion 5 5  Wrist extension 5 5  Wrist pronation 5 5  Wrist supination 5 5  Grip strength fair good   (Blank rows = not tested)   LOWER EXTREMITY MMT:  MMT Right eval Left eval Right 12/15/2022 Left  12/15/22  Hip flexion 4 4+ 4+ 5  Hip extension 4 4+ 4 4+  Hip abduction 4+ 4+ 5 5  Hip adduction 4 4 5 5   Knee flexion 4 4+ 5 5  Knee extension 5 5 5 5   Ankle dorsiflexion 4+ 5 5 5   Ankle plantarflexion 5 5 5 5    (Blank rows = not tested)  UPPER EXTREMITY MMT:  MMT Right 11/30/22 Left 11/30/22 Right 12/15/22 Left 12/15/22  Grip  strength 21# 13# 35# 25#     COORDINATION:  Normal UE and LE - Repeated heel to shin, toe taps, alternating arm supination/pronation, thumb to fingers, all normal.   FUNCTIONAL TESTS:  5 times sit to stand: 11 seconds without UE assist Functional gait assessment: 26/30 Tandem 30 sec each side, single leg stance 6 sec each side   GAIT: Distance walked: 250' Assistive device utilized: None Level of assistance: Complete Independence Comments: no deviation   TODAY'S TREATMENT:                                                                                                                              DATE:  12/17/22 Therapeutic Exercise: to improve strength and mobility.  Demo, verbal  and tactile cues throughout for technique. Nustep L5 x 6 min  Single leg RDL x 10 bil Squats 2x10 Pallof press blue TB 2x10 Trunk rotation x 10 blue TB Prone hip extension x 10 bil Standing hip flexion, abduction, extension RTB x 10 each  NEUROMUSCULAR RE-EDUCATION: To improve posture, balance, and kinesthesia. Toe taps from airex pad: Fwd and lateral both sides x 10 each; progressed another set increasing distance from airex to target Tandem walk fwd and back 2x20 ft Bird dog 2x5 bil  12/15/22 Therapeutic Exercise: to improve strength and mobility.  Demo, verbal and tactile cues throughout for technique. Nustep L5 x 6 min  Single leg RDLs 2 x 5 bil  Squats 2 x 10  Prone hip extensions 2 x 10 bil  Quadruped hip extensions 2 x 5 bil  Bird dogs 2 x 10 bil  Neuromuscular Reeducation: to improve balance and stability. SBA for safety throughout.  SLS in corner 3 x 10 sec hold Tandem walking Therapeutic Activity:  MMT and grip strength   12/08/22 Therapeutic Exercise: to improve strength and mobility.  Demo, verbal and tactile cues throughout for technique. L6 x 6 min  Side lunges with foot on towel x 10 bil  Back lunges with foot on towel x 10 bil  Forward lunges with foot on towel 2 x 10 bil  Neuromuscular Reeducation: to improve balance and stability. SBA for safety throughout.  Star excursion pattern 360' x 3 each side - SBA for safety In corner: SLS - R average 6 sec,  L average 7 sec, improved after cues for posture and core engagement, able to maintain R SLS x 20 sec.  On airex: Eyes open feet semi tandem x 30 sec  Eyes open feet semi tandem  with head nods x 10 Eyes open feet semi tandem  with head turns x 10 Eyes closed feet semi tandem x 30 sec Eyes closed feet semi tandem with head nods x 10 Eyes closed feet semi tandem with head turns x 10    PATIENT EDUCATION:  Education details: HEP update  Person educated: Patient Education method: Explanation, Demonstration,  and Handouts Education comprehension: verbalized understanding, returned demonstration, and needs further education  HOME EXERCISE PROGRAM: Access Code: [email or web address] URL: [email or web address] Date: 12/15/2022 Prepared by: Glenetta Hew  Exercises - Prone Hip Extension with Plantarflexion  - 1 x daily - 7 x weekly - 2-3 sets - 10 reps - Bird Dog  - 1 x daily - 7 x weekly - 2-3 sets - 10 reps    ASSESSMENT:  CLINICAL IMPRESSION: Pt denied any recent episodes of dizziness. She was able to participate in all exercises and balance activities with no issues. Focused on TE and neuro re-ed to improve hip strength, coordination, and balance to improve QOL. Able to progress backward tandem walk today. She did have occasional LOB with tandem walk, and toe taps from airex.   OBJECTIVE IMPAIRMENTS: decreased balance, decreased endurance, difficulty walking, and decreased strength.   ACTIVITY LIMITATIONS: locomotion level  PARTICIPATION LIMITATIONS: driving and occupation  PERSONAL FACTORS: Age and Fitness are also affecting patient's functional outcome.   REHAB POTENTIAL: Good  CLINICAL DECISION MAKING: Stable/uncomplicated  EVALUATION COMPLEXITY: Low   GOALS: Goals reviewed with patient? Yes  SHORT TERM GOALS: Target date: 12/08/2022   Patient will be independent with initial HEP. Baseline: given 11/30/22 Goal status: MET 12/08/22   LONG TERM GOALS: Target date: 01/05/2023   Patient will be independent with advanced/ongoing HEP to improve outcomes and carryover.  Baseline:  Goal status: IN PROGRESS 12/15/22- advanced.   2.  Patient will score 30/30 on FGA to demonstrate improved dynamic balance and no fall risk.   Baseline: 26/30 Goal status: IN PROGRESS  3.  Patient will be able to maintain SLS x 30 seconds to decrease fall risk.  Baseline: 6 seconds Goal status: IN PROGRESS   4.  Patient will demonstrate 5/5 bil LE strength.  Baseline: RLE weakness compared  to LLE Goal status: IN PROGRESS 12/15/22- improving, see objective.   5.  Patient will report >60/80 on LEFS to demonstrate improved functional ability. Baseline: 50/80 Goal status: IN PROGRESS  6.  Patient will demonstrate improved R grip strength by at least 10 pounds.  Baseline: 21# Goal status: MET 12/15/22 35#   PLAN:  PT FREQUENCY: 2x/week  PT DURATION: 6 weeks  PLANNED INTERVENTIONS: Therapeutic exercises, Therapeutic activity, Neuromuscular re-education, Balance training, Gait training, Patient/Family education, Self Care, Stair training, and Re-evaluation  PLAN FOR NEXT SESSION:  focus higher level balance  - single leg, tandem, etc. , hip extensor and flexor strengthening.    Artist Pais, PTA 12/17/2022, 9:35 AM

## 2022-12-22 ENCOUNTER — Ambulatory Visit: Payer: 59

## 2022-12-22 DIAGNOSIS — I6381 Other cerebral infarction due to occlusion or stenosis of small artery: Secondary | ICD-10-CM

## 2022-12-22 DIAGNOSIS — R2681 Unsteadiness on feet: Secondary | ICD-10-CM

## 2022-12-22 DIAGNOSIS — M6281 Muscle weakness (generalized): Secondary | ICD-10-CM

## 2022-12-22 NOTE — Therapy (Signed)
OUTPATIENT PHYSICAL THERAPY TREATMENT   Patient Name: Rosalena Immerman MRN: [id number] DOB:Sep 08, 1961, 62 y.o., female Today's Date: 12/22/2022  END OF SESSION:  PT End of Session - 12/22/22 0852     Visit Number 6    Number of Visits 12    Date for PT Re-Evaluation 01/05/23    Authorization Type UHC [id number]    PT Start Time [id number]    PT Stop Time 0928    PT Time Calculation (min) 41 min    Activity Tolerance Patient tolerated treatment well    Behavior During Therapy [facility] for tasks assessed/performed               Past Medical History:  Diagnosis Date   Hypercholesteremia    Stroke ([place]) 11/09/2022   History reviewed. No pertinent surgical history. Patient Active Problem List   Diagnosis Date Noted   Lacunar infarct, acute ([place]) 11/13/2022   CVA (cerebral vascular accident) ([place]) 11/09/2022   Hyperlipidemia 11/09/2022   Obesity (BMI 30-39.9) 11/09/2022    PCP: Harlan Stains, MD  REFERRING PROVIDER: Izora Ribas, MD  REFERRING DIAG: I63.81 CVA   THERAPY DIAG:  Muscle weakness (generalized)  Unsteadiness on feet  Lacunar infarct, acute ([place])  Rationale for Evaluation and Treatment: Rehabilitation  ONSET DATE: 11/08/2022   SUBJECTIVE:   SUBJECTIVE STATEMENT:  Pt reports no new complaints or concerns.  PERTINENT HISTORY: CVA- lacunar infarct pons 11/08/22 PAIN:  Are you having pain? No   PRECAUTIONS: None  WEIGHT BEARING RESTRICTIONS: No  FALLS:  Has patient fallen in last 6 months? 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    NEXT MD VISIT: 12/25/22 - hospital follow-up, 12/31/22- neurologist   OBJECTIVE:   DIAGNOSTIC FINDINGS: MRI Brain 11/09/22 IMPRESSION: 1. MRI today is positive  for Acute lacunar infarcts in the central Pons, left > right. No associated hemorrhage or mass effect.   2. Elsewhere stable and normal for age MRI appearance of the brain.  PATIENT SURVEYS:  Stroke Impact Scale 20/80 LEFS 50/80 = 62.5%  COGNITION: Overall cognitive status: Within functional limits for tasks assessed     SENSATION: WFL  POSTURE:  No Significant postural limitations  UPPER EXTREMITY MMT:  MMT Right eval Left eval  Shoulder flexion 5 5  Shoulder extension    Shoulder abduction 5 5  Shoulder internal rotation 5 5  Shoulder external rotation 5 5  Elbow flexion 5 5  Elbow extension 5 5  Wrist flexion 5 5  Wrist extension 5 5  Wrist pronation 5 5  Wrist supination 5 5  Grip strength fair good   (Blank rows = not tested)   LOWER EXTREMITY MMT:  MMT Right eval Left eval Right 12/15/2022 Left  12/15/22  Hip flexion 4 4+ 4+ 5  Hip extension 4 4+ 4 4+  Hip abduction 4+ 4+ 5 5  Hip adduction 4 4 5 5  $ Knee flexion 4 4+ 5 5  Knee extension 5 5 5 5  $ Ankle dorsiflexion 4+ 5 5 5  $ Ankle plantarflexion 5 5 5 5   $ (Blank rows = not tested)  UPPER EXTREMITY MMT:  MMT Right 11/30/22 Left 11/30/22 Right 12/15/22 Left 12/15/22  Grip strength 21# 13# 35# 25#     COORDINATION:  Normal  UE and LE - Repeated heel to shin, toe taps, alternating arm supination/pronation, thumb to fingers, all normal.   FUNCTIONAL TESTS:  5 times sit to stand: 11 seconds without UE assist Functional gait assessment: 26/30 Tandem 30 sec each side, single leg stance 6 sec each side   GAIT: Distance walked: 250' Assistive device utilized: None Level of assistance: Complete Independence Comments: no deviation   TODAY'S TREATMENT:                                                                                                                              DATE:   12/22/22 Therapeutic Exercise: to improve strength and mobility.  Demo, verbal and tactile cues throughout for  technique. Nustep L5 x 6 min Rocker board all directions x 10  Seated LAQ 2x10 with RTB bil Seated HS curls RTB 2x10 bil  NEUROMUSCULAR RE-EDUCATION: To improve posture, balance, and kinesthesia. Tandem walk along balance beam 6x Braiding along balance beam 4x Tandem stance on balance beam with perturbations 2x for 10 sec Clock balance standing on foam oval all the way around 2x each LE R/L SLS x 30 sec  12/17/22 Therapeutic Exercise: to improve strength and mobility.  Demo, verbal and tactile cues throughout for technique. Nustep L5 x 6 min  Single leg RDL x 10 bil Squats 2x10 Pallof press blue TB 2x10 Trunk rotation x 10 blue TB Prone hip extension x 10 bil Standing hip flexion, abduction, extension RTB x 10 each  NEUROMUSCULAR RE-EDUCATION: To improve posture, balance, and kinesthesia. Toe taps from airex pad: Fwd and lateral both sides x 10 each; progressed another set increasing distance from airex to target Tandem walk fwd and back 2x20 ft Bird dog 2x5 bil  12/15/22 Therapeutic Exercise: to improve strength and mobility.  Demo, verbal and tactile cues throughout for technique. Nustep L5 x 6 min  Single leg RDLs 2 x 5 bil  Squats 2 x 10  Prone hip extensions 2 x 10 bil  Quadruped hip extensions 2 x 5 bil  Bird dogs 2 x 10 bil  Neuromuscular Reeducation: to improve balance and stability. SBA for safety throughout.  SLS in corner 3 x 10 sec hold Tandem walking Therapeutic Activity:  MMT and grip strength   12/08/22 Therapeutic Exercise: to improve strength and mobility.  Demo, verbal and tactile cues throughout for technique. L6 x 6 min  Side lunges with foot on towel x 10 bil  Back lunges with foot on towel x 10 bil  Forward lunges with foot on towel 2 x 10 bil  Neuromuscular Reeducation: to improve balance and stability. SBA for safety throughout.  Star excursion pattern 360' x 3 each side - SBA for safety In corner: SLS - R average 6 sec,  L average 7 sec, improved  after cues for posture and core engagement, able to maintain R SLS x 20 sec.  On airex: Eyes open feet semi tandem x 30 sec  Eyes open feet semi tandem  with head nods x 10 Eyes open feet semi tandem  with head turns x 10 Eyes closed feet semi tandem x 30 sec Eyes closed feet semi tandem with head nods x 10 Eyes closed feet semi tandem with head turns x 10    PATIENT EDUCATION:  Education details: HEP update  Person educated: Patient Education method: Explanation, Demonstration, and Handouts Education comprehension: verbalized understanding, returned demonstration, and needs further education  HOME EXERCISE PROGRAM: Access Code: [email or web address] URL: [email or web address] Date: 12/15/2022 Prepared by: Glenetta Hew  Exercises - Prone Hip Extension with Plantarflexion  - 1 x daily - 7 x weekly - 2-3 sets - 10 reps - Bird Dog  - 1 x daily - 7 x weekly - 2-3 sets - 10 reps    ASSESSMENT:  CLINICAL IMPRESSION: Pt shows good response to progression of balance activities and exercises. Her SLS has improved to 10 sec w/o having to place opposite foot on ground, progressing toward LTG #3. Able to add compliant surface to tandem walk and braiding. CGA was required for safety, cues given for proper muscle activation for best results.    OBJECTIVE IMPAIRMENTS: decreased balance, decreased endurance, difficulty walking, and decreased strength.   ACTIVITY LIMITATIONS: locomotion level  PARTICIPATION LIMITATIONS: driving and occupation  PERSONAL FACTORS: Age and Fitness are also affecting patient's functional outcome.   REHAB POTENTIAL: Good  CLINICAL DECISION MAKING: Stable/uncomplicated  EVALUATION COMPLEXITY: Low   GOALS: Goals reviewed with patient? Yes  SHORT TERM GOALS: Target date: 12/08/2022   Patient will be independent with initial HEP. Baseline: given 11/30/22 Goal status: MET 12/08/22   LONG TERM GOALS: Target date: 01/05/2023   Patient will be  independent with advanced/ongoing HEP to improve outcomes and carryover.  Baseline:  Goal status: IN PROGRESS 12/15/22- advanced.   2.  Patient will score 30/30 on FGA to demonstrate improved dynamic balance and no fall risk.   Baseline: 26/30 Goal status: IN PROGRESS  3.  Patient will be able to maintain SLS x 30 seconds to decrease fall risk.  Baseline: 6 seconds Goal status: IN PROGRESS 12/22/22- able to stand 10 sec without putting opposite leg on ground  4.  Patient will demonstrate 5/5 bil LE strength.  Baseline: RLE weakness compared to LLE Goal status: IN PROGRESS 12/15/22- improving, see objective.   5.  Patient will report >60/80 on LEFS to demonstrate improved functional ability. Baseline: 50/80 Goal status: IN PROGRESS  6.  Patient will demonstrate improved R grip strength by at least 10 pounds.  Baseline: 21# Goal status: MET 12/15/22 35#   PLAN:  PT FREQUENCY: 2x/week  PT DURATION: 6 weeks  PLANNED INTERVENTIONS: Therapeutic exercises, Therapeutic activity, Neuromuscular re-education, Balance training, Gait training, Patient/Family education, Self Care, Stair training, and Re-evaluation  PLAN FOR NEXT SESSION:  focus higher level balance  - single leg, tandem, etc. , hip extensor and flexor strengthening.    Artist Pais, PTA 12/22/2022, 10:20 AM

## 2022-12-24 ENCOUNTER — Encounter: Payer: Self-pay | Admitting: Physical Therapy

## 2022-12-24 ENCOUNTER — Ambulatory Visit: Payer: 59 | Admitting: Physical Therapy

## 2022-12-24 DIAGNOSIS — M6281 Muscle weakness (generalized): Secondary | ICD-10-CM | POA: Diagnosis not present

## 2022-12-24 DIAGNOSIS — R2681 Unsteadiness on feet: Secondary | ICD-10-CM

## 2022-12-24 NOTE — Therapy (Signed)
OUTPATIENT PHYSICAL THERAPY TREATMENT   Patient Name: Latorria Muralles MRN: [id number] DOB:1960-11-22, 62 y.o., female Today's Date: 12/24/2022  END OF SESSION:  PT End of Session - 12/24/22 0851     Visit Number 7    Number of Visits 12    Date for PT Re-Evaluation 01/05/23    Authorization Type UHC [id number]    PT Start Time 0849    PT Stop Time 0931    PT Time Calculation (min) 42 min    Activity Tolerance Patient tolerated treatment well    Behavior During Therapy [facility] for tasks assessed/performed               Past Medical History:  Diagnosis Date   Hypercholesteremia    Stroke ([place]) 11/09/2022   History reviewed. No pertinent surgical history. Patient Active Problem List   Diagnosis Date Noted   Lacunar infarct, acute ([place]) 11/13/2022   CVA (cerebral vascular accident) ([place]) 11/09/2022   Hyperlipidemia 11/09/2022   Obesity (BMI 30-39.9) 11/09/2022    PCP: Harlan Stains, MD  REFERRING PROVIDER: Izora Ribas, MD  REFERRING DIAG: I63.81 CVA   THERAPY DIAG:  Muscle weakness (generalized)  Unsteadiness on feet  Rationale for Evaluation and Treatment: Rehabilitation  ONSET DATE: 11/08/2022   SUBJECTIVE:   SUBJECTIVE STATEMENT:  Pt reports no new complaints or concerns.  PERTINENT HISTORY: CVA- lacunar infarct pons 11/08/22 PAIN:  Are you having pain? No   PRECAUTIONS: None  WEIGHT BEARING RESTRICTIONS: No  FALLS:  Has patient fallen in last 6 months? 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    NEXT MD VISIT: 12/25/22 - hospital follow-up, 12/31/22- neurologist   OBJECTIVE:   DIAGNOSTIC FINDINGS: MRI Brain 11/09/22 IMPRESSION: 1. MRI today is positive for Acute lacunar infarcts in  the central Pons, left > right. No associated hemorrhage or mass effect.   2. Elsewhere stable and normal for age MRI appearance of the brain.  PATIENT SURVEYS:  Stroke Impact Scale 20/80 LEFS 50/80 = 62.5%  COGNITION: Overall cognitive status: Within functional limits for tasks assessed     SENSATION: WFL  POSTURE:  No Significant postural limitations  UPPER EXTREMITY MMT:  MMT Right eval Left eval  Shoulder flexion 5 5  Shoulder extension    Shoulder abduction 5 5  Shoulder internal rotation 5 5  Shoulder external rotation 5 5  Elbow flexion 5 5  Elbow extension 5 5  Wrist flexion 5 5  Wrist extension 5 5  Wrist pronation 5 5  Wrist supination 5 5  Grip strength fair good   (Blank rows = not tested)   LOWER EXTREMITY MMT:  MMT Right eval Left eval Right 12/15/2022 Left  12/15/22  Hip flexion 4 4+ 4+ 5  Hip extension 4 4+ 4 4+  Hip abduction 4+ 4+ 5 5  Hip adduction 4 4 5 5  $ Knee flexion 4 4+ 5 5  Knee extension 5 5 5 5  $ Ankle dorsiflexion 4+ 5 5 5  $ Ankle plantarflexion 5 5 5 5   $ (Blank rows = not tested)  UPPER EXTREMITY MMT:  MMT Right 11/30/22 Left 11/30/22 Right 12/15/22 Left 12/15/22  Grip strength 21# 13# 35# 25#     COORDINATION:  Normal UE and LE - Repeated  heel to shin, toe taps, alternating arm supination/pronation, thumb to fingers, all normal.   FUNCTIONAL TESTS:  5 times sit to stand: 11 seconds without UE assist Functional gait assessment: 26/30 Tandem 30 sec each side, single leg stance 6 sec each side   GAIT: Distance walked: 250' Assistive device utilized: None Level of assistance: Complete Independence Comments: no deviation   TODAY'S TREATMENT:                                                                                                                              DATE:   12/24/22 Therapeutic Exercise: to improve strength and mobility.  Demo, verbal and tactile cues throughout for technique. Nustep L5 x 6 min  Lunges  forward 2 x 10 with one foot on bosu Side lunges 2 x 10 bil with one foot on bosu Bridges x 10 Single leg bridges x 10 Bridges on bosu x 10  Neuromuscular Reeducation: to improve balance and stability. SBA for safety throughout.  Standing on flat side of bosu - side to side weight shifts, mini-squats, then squatting to reach bean bags on stool and throwing.  SLS x 26 sec L, x 15 sec R   12/22/22 Therapeutic Exercise: to improve strength and mobility.  Demo, verbal and tactile cues throughout for technique. Nustep L5 x 6 min Rocker board all directions x 10  Seated LAQ 2x10 with RTB bil Seated HS curls RTB 2x10 bil  NEUROMUSCULAR RE-EDUCATION: To improve posture, balance, and kinesthesia. Tandem walk along balance beam 6x Braiding along balance beam 4x Tandem stance on balance beam with perturbations 2x for 10 sec Clock balance standing on foam oval all the way around 2x each LE R/L SLS x 30 sec  12/17/22 Therapeutic Exercise: to improve strength and mobility.  Demo, verbal and tactile cues throughout for technique. Nustep L5 x 6 min  Single leg RDL x 10 bil Squats 2x10 Pallof press blue TB 2x10 Trunk rotation x 10 blue TB Prone hip extension x 10 bil Standing hip flexion, abduction, extension RTB x 10 each  NEUROMUSCULAR RE-EDUCATION: To improve posture, balance, and kinesthesia. Toe taps from airex pad: Fwd and lateral both sides x 10 each; progressed another set increasing distance from airex to target Tandem walk fwd and back 2x20 ft Bird dog 2x5 bil     PATIENT EDUCATION:  Education details: HEP update  Person educated: Patient Education method: Explanation, Demonstration, and Handouts Education comprehension: verbalized understanding, returned demonstration, and needs further education  HOME EXERCISE PROGRAM: Access Code: [email or web address] URL: [email or web address] Date: 12/24/2022 Prepared by: Glenetta Hew  Exercises Added   - White  - 1 x daily - 7 x weekly - 3 sets - 10 reps - Lateral Step Up and Overs on BOSU  - 1 x daily - 7 x weekly - 3 sets - 10 reps - Beginner Bridge  - 1 x daily - 7 x weekly - 3 sets -  10 reps - Alternating Single Leg Bridge  - 1 x daily - 7 x weekly - 3 sets - 10 reps - Bridge on BOSU  - 1 x daily - 7 x weekly - 3 sets - 10 reps    ASSESSMENT:  CLINICAL IMPRESSION: Ecrin Taussig is making good progress, today able to maintain SLS for 26 sec on L and 15 sec on R.  She was able to progress to more challenging activities on bosu.  Lunges on bosu added to HEP since she does have one at home, with instructions to do next to counter for safety.  Also discussed seated posture.  Osinachi Crisafi continues to demonstrate potential for improvement and would benefit from continued skilled therapy to address impairments.    OBJECTIVE IMPAIRMENTS: decreased balance, decreased endurance, difficulty walking, and decreased strength.   ACTIVITY LIMITATIONS: locomotion level  PARTICIPATION LIMITATIONS: driving and occupation  PERSONAL FACTORS: Age and Fitness are also affecting patient's functional outcome.   REHAB POTENTIAL: Good  CLINICAL DECISION MAKING: Stable/uncomplicated  EVALUATION COMPLEXITY: Low   GOALS: Goals reviewed with patient? Yes  SHORT TERM GOALS: Target date: 12/08/2022   Patient will be independent with initial HEP. Baseline: given 11/30/22 Goal status: MET 12/08/22   LONG TERM GOALS: Target date: 01/05/2023   Patient will be independent with advanced/ongoing HEP to improve outcomes and carryover.  Baseline:  Goal status: IN PROGRESS 12/15/22- advanced.   2.  Patient will score 30/30 on FGA to demonstrate improved dynamic balance and no fall risk.   Baseline: 26/30 Goal status: IN PROGRESS  3.  Patient will be able to maintain SLS x 30 seconds to decrease fall risk.  Baseline: 6 seconds Goal status: IN PROGRESS 12/22/22- able to stand 10 sec without putting opposite  leg on ground  4.  Patient will demonstrate 5/5 bil LE strength.  Baseline: RLE weakness compared to LLE Goal status: IN PROGRESS 12/15/22- improving, see objective.   5.  Patient will report >60/80 on LEFS to demonstrate improved functional ability. Baseline: 50/80 Goal status: IN PROGRESS  6.  Patient will demonstrate improved R grip strength by at least 10 pounds.  Baseline: 21# Goal status: MET 12/15/22 35#   PLAN:  PT FREQUENCY: 2x/week  PT DURATION: 6 weeks  PLANNED INTERVENTIONS: Therapeutic exercises, Therapeutic activity, Neuromuscular re-education, Balance training, Gait training, Patient/Family education, Self Care, Stair training, and Re-evaluation  PLAN FOR NEXT SESSION:  focus higher level balance  - single leg, tandem, etc. , hip extensor and flexor strengthening.    Rennie Natter, PT, DPT  12/24/2022, 11:31 AM

## 2022-12-25 ENCOUNTER — Encounter (HOSPITAL_BASED_OUTPATIENT_CLINIC_OR_DEPARTMENT_OTHER): Payer: 59 | Admitting: Physical Medicine and Rehabilitation

## 2022-12-25 ENCOUNTER — Encounter: Payer: Self-pay | Admitting: Physical Medicine and Rehabilitation

## 2022-12-25 VITALS — BP 114/73 | HR 71 | Ht 62.0 in | Wt 180.0 lb

## 2022-12-25 DIAGNOSIS — R200 Anesthesia of skin: Secondary | ICD-10-CM | POA: Diagnosis present

## 2022-12-25 DIAGNOSIS — R252 Cramp and spasm: Secondary | ICD-10-CM

## 2022-12-25 DIAGNOSIS — R3915 Urgency of urination: Secondary | ICD-10-CM | POA: Diagnosis present

## 2022-12-25 DIAGNOSIS — I639 Cerebral infarction, unspecified: Secondary | ICD-10-CM | POA: Diagnosis present

## 2022-12-25 DIAGNOSIS — R400 Somnolence: Secondary | ICD-10-CM | POA: Diagnosis present

## 2022-12-25 MED ORDER — ROSUVASTATIN CALCIUM 40 MG PO TABS: 40.0000 mg | ORAL_TABLET | Freq: Every day | ORAL | 0 refills | Status: AC

## 2022-12-25 MED ORDER — ROSUVASTATIN CALCIUM 20 MG PO TABS: 20.0000 mg | ORAL_TABLET | Freq: Every day | ORAL | 3 refills | Status: DC

## 2022-12-25 NOTE — Patient Instructions (Addendum)
Insomnia: -Try to go outside near sunrise -Get exercise during the day.  -Turn off all devices an hour before bedtime.  -Teas that can benefit: chamomile, valerian root, Brahmi (Bacopa) -Can consider over the counter melatonin, magnesium, and/or L-theanine. Melatonin is an anti-oxidant with multiple health benefits. Magnesium is involved in greater than 300 enzymatic reactions in the body and most of [place] are deficient as our soil is often depleted. There are 7 different types of magnesium- Bioptemizer's is a supplement with all 7 types, and each has unique benefits. Magnesium can also help with constipation and anxiety.  -Pistachios naturally increase the production of melatonin -Cozy Earth bamboo bed sheets are free from toxic chemicals.  -Tart cherry juice or a tart cherry supplement can improve sleep and soreness post-workout   Red light therapy

## 2022-12-25 NOTE — Progress Notes (Signed)
Subjective:    Patient ID: Joy Shelton, female    DOB: [birth year], 62 y.o.   MRN: [id number]  HPI  1) Insomnia -wakes at 4am -not getting enough sunlight exposure during the day   2) Lip numbness  3) Muscle cramps -present more at night  4) Urinary urgency:  -she wears Depends -she has about a minute after she feels the urge to urinate before she urinates -she is only incontinent when there is no bathroom near her -during the day she is going to the bathroom at a normal frequency  5) Daytime somnolence  Pain Inventory Average Pain 1 Pain Right Now 0 My pain is dull and aching  LOCATION OF PAIN  Hip, left side   BOWEL Number of stools per week: 5-7  Incontinent Yes   BLADDER Pads Bladder incontinence Yes  Frequent urination Yes    Mobility walk without assistance how many minutes can you walk? 30 ability to climb steps?  yes do you drive?  yes Do you have any goals in this area?  yes  Function employed # of hrs/week 12  Neuro/Psych bladder control problems tingling  Prior Studies Any changes since last visit?  no  Physicians involved in your care Any changes since last visit?  no   No family history on file. Social History   Socioeconomic History   Marital status: Single    Spouse name: Not on file   Number of children: Not on file   Years of education: Not on file   Highest education level: Not on file  Occupational History   Not on file  Tobacco Use   Smoking status: Never   Smokeless tobacco: Never  Vaping Use   Vaping Use: Never used  Substance and Sexual Activity   Alcohol use: Not Currently   Drug use: Never   Sexual activity: Not Currently  Other Topics Concern   Not on file  Social History Narrative   Not on file   Social Determinants of Health   Financial Resource Strain: Not on file  Food Insecurity: No Food Insecurity (11/09/2022)   Hunger Vital Sign    Worried About Running Out of Food in the Last Year: Never  true    Ran Out of Food in the Last Year: Never true  Transportation Needs: No Transportation Needs (11/09/2022)   PRAPARE - Hydrologist (Medical): No    Lack of Transportation (Non-Medical): No  Physical Activity: Not on file  Stress: Not on file  Social Connections: Not on file   No past surgical history on file. Past Medical History:  Diagnosis Date   Hypercholesteremia    Stroke ([place]) 11/09/2022   There were no vitals taken for this visit.  Opioid Risk Score:   Fall Risk Score:  `1  Depression screen PHQ 2/9      No data to display            Review of Systems  Musculoskeletal:        Left pain in Hip   All other systems reviewed and are negative.     Objective:   Physical Exam  Gen: no distress, normal appearing HEENT: oral mucosa pink and moist, NCAT Cardio: Reg rate Chest: normal effort, normal rate of breathing Abd: soft, non-distended Ext: no edema Psych: pleasant, normal affect Skin: intact Neuro: Alert and oriented x3      Assessment & Plan:   1) Insomnia: -recommended red light therapy -Try  to go outside near sunrise -Get exercise during the day.  -Turn off all devices an hour before bedtime.  -Teas that can benefit: chamomile, valerian root, Brahmi (Bacopa) -Can consider over the counter melatonin, magnesium, and/or L-theanine. Melatonin is an anti-oxidant with multiple health benefits. Magnesium is involved in greater than 300 enzymatic reactions in the body and most of [place] are deficient as our soil is often depleted. There are 7 different types of magnesium- Bioptemizer's is a supplement with all 7 types, and each has unique benefits. Magnesium can also help with constipation and anxiety.  -Pistachios naturally increase the production of melatonin -Cozy Earth bamboo bed sheets are free from toxic chemicals.  -Tart cherry juice or a tart cherry supplement can improve sleep and soreness post-workout  2) Muscle  cramps: -increase magnesium gluconate to 536m HS  3) Urinary urgency: -discussed timed toileting   4) Daytime somnolence -continue CPAP, discussed that sleep apnea is a risk factor for stroke.

## 2022-12-29 ENCOUNTER — Ambulatory Visit: Payer: 59

## 2022-12-29 DIAGNOSIS — M6281 Muscle weakness (generalized): Secondary | ICD-10-CM | POA: Diagnosis not present

## 2022-12-29 DIAGNOSIS — R2681 Unsteadiness on feet: Secondary | ICD-10-CM

## 2022-12-29 DIAGNOSIS — I6381 Other cerebral infarction due to occlusion or stenosis of small artery: Secondary | ICD-10-CM

## 2022-12-29 NOTE — Therapy (Signed)
OUTPATIENT PHYSICAL THERAPY TREATMENT   Patient Name: Joy Shelton MRN: [id number] DOB:[birth year], 62 y.o., female Today's Date: 12/29/2022  END OF SESSION:  PT End of Session - 12/29/22 0854     Visit Number 8    Number of Visits 12    Date for PT Re-Evaluation 01/05/23    Authorization Type UHC [id number]    PT Start Time 0846    PT Stop Time 0929    PT Time Calculation (min) 43 min    Activity Tolerance Patient tolerated treatment well    Behavior During Therapy [facility] for tasks assessed/performed                Past Medical History:  Diagnosis Date   Hypercholesteremia    Stroke ([place]) 11/09/2022   History reviewed. No pertinent surgical history. Patient Active Problem List   Diagnosis Date Noted   Lacunar infarct, acute ([place]) 11/13/2022   CVA (cerebral vascular accident) ([place]) 11/09/2022   Hyperlipidemia 11/09/2022   Obesity (BMI 30-39.9) 11/09/2022    PCP: Harlan Stains, MD  REFERRING PROVIDER: Izora Ribas, MD  REFERRING DIAG: I63.81 CVA   THERAPY DIAG:  Muscle weakness (generalized)  Unsteadiness on feet  Lacunar infarct, acute ([place])  Rationale for Evaluation and Treatment: Rehabilitation  ONSET DATE: 11/08/2022   SUBJECTIVE:   SUBJECTIVE STATEMENT:  Pt reports being taken off some medications, still on Rosuvastatin Calcium and baby aspirin  PERTINENT HISTORY: CVA- lacunar infarct pons 11/08/22 PAIN:  Are you having pain? No   PRECAUTIONS: None  WEIGHT BEARING RESTRICTIONS: No  FALLS:  Has patient fallen in last 6 months? 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    NEXT MD VISIT: 12/25/22 - hospital follow-up, 12/31/22- neurologist   OBJECTIVE:   DIAGNOSTIC FINDINGS: MRI  Brain 11/09/22 IMPRESSION: 1. 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 No associated hemorrhage or mass effect.   2. Elsewhere stable and normal for age MRI appearance of the brain.  PATIENT SURVEYS:  Stroke Impact Scale 20/80 LEFS 50/80 = 62.5%  COGNITION: Overall cognitive status: Within functional limits for tasks assessed     SENSATION: WFL  POSTURE:  No Significant postural limitations  UPPER EXTREMITY MMT:  MMT Right eval Left eval  Shoulder flexion 5 5  Shoulder extension    Shoulder abduction 5 5  Shoulder internal rotation 5 5  Shoulder external rotation 5 5  Elbow flexion 5 5  Elbow extension 5 5  Wrist flexion 5 5  Wrist extension 5 5  Wrist pronation 5 5  Wrist supination 5 5  Grip strength fair good   (Blank rows = not tested)   LOWER EXTREMITY MMT:  MMT Right eval Left eval Right 12/15/2022 Left  12/15/22  Hip flexion 4 4+ 4+ 5  Hip extension 4 4+ 4 4+  Hip abduction 4+ 4+ 5 5  Hip adduction 4 4 5 5  $ Knee flexion 4 4+ 5 5  Knee extension 5 5 5 5  $ Ankle dorsiflexion 4+ 5 5 5  $ Ankle plantarflexion 5 5 5 5   $ (Blank rows = not tested)  UPPER EXTREMITY MMT:  MMT Right 11/30/22 Left 11/30/22 Right 12/15/22 Left 12/15/22  Grip strength 21# 13# 35#  25#     COORDINATION:  Normal UE and LE - Repeated heel to shin, toe taps, alternating arm supination/pronation, thumb to fingers, all normal.   FUNCTIONAL TESTS:  5 times sit to stand: 11 seconds without UE assist Functional gait assessment: 26/30 Tandem 30 sec each side, single leg stance 6 sec each side   GAIT: Distance walked: 250' Assistive device utilized: None Level of assistance: Complete Independence Comments: no deviation   TODAY'S TREATMENT:                                                                                                                              DATE:  12/29/22 Therapeutic Exercise: to improve strength and mobility.  Demo,  verbal and tactile cues throughout for technique. Nustep L6 x 6 min  Standing hip abduction GTB x 10  Standing hip ext GTB x 10  Sidestep GTB 2x25 ft  Neuromuscular Reeducation: to improve balance and stability. SBA for safety throughout.  Standing on flat side of BOSU: Fwd/Back WS x 10 each Circular movement weight shifting x 10 Fwd steps starting on airex stepping to floor x 10 bil Toe taps from airex to different color cones, calling out cone color and foot for patient to tap  Backward stepping from airex with arm raise x 10   12/24/22 Therapeutic Exercise: to improve strength and mobility.  Demo, verbal and tactile cues throughout for technique. Nustep L5 x 6 min  Lunges forward 2 x 10 with one foot on bosu Side lunges 2 x 10 bil with one foot on bosu Bridges x 10 Single leg bridges x 10 Bridges on bosu x 10  Neuromuscular Reeducation: to improve balance and stability. SBA for safety throughout.  Standing on flat side of bosu - side to side weight shifts, mini-squats, then squatting to reach bean bags on stool and throwing.  SLS x 26 sec L, x 15 sec R   12/22/22 Therapeutic Exercise: to improve strength and mobility.  Demo, verbal and tactile cues throughout for technique. Nustep L5 x 6 min Rocker board all directions x 10  Seated LAQ 2x10 with RTB bil Seated HS curls RTB 2x10 bil  NEUROMUSCULAR RE-EDUCATION: To improve posture, balance, and kinesthesia. Tandem walk along balance beam 6x Braiding along balance beam 4x Tandem stance on balance beam with perturbations 2x for 10 sec Clock balance standing on foam oval all the way around 2x each LE R/L SLS x 30 sec  12/17/22 Therapeutic Exercise: to improve strength and mobility.  Demo, verbal and tactile cues throughout for technique. Nustep L5 x 6 min  Single leg RDL x 10 bil Squats 2x10 Pallof press blue TB 2x10 Trunk rotation x 10 blue TB Prone hip extension x 10 bil Standing hip flexion, abduction, extension RTB x 10  each  NEUROMUSCULAR RE-EDUCATION: To improve posture, balance, and kinesthesia. Toe taps from airex pad: Fwd and lateral both sides x 10 each; progressed another set increasing distance  from airex to target Tandem walk fwd and back 2x20 ft Bird dog 2x5 bil     PATIENT EDUCATION:  Education details: HEP update  Person educated: Patient Education method: Explanation, Demonstration, and Handouts Education comprehension: verbalized understanding, returned demonstration, and needs further education  HOME EXERCISE PROGRAM: Access Code: [email or web address] URL: [email or web address] Date: 12/24/2022 Prepared by: Glenetta Hew  Exercises Added   - [place]  - 1 x daily - 7 x weekly - 3 sets - 10 reps - Lateral Step Up and Overs on BOSU  - 1 x daily - 7 x weekly - 3 sets - 10 reps - Beginner Bridge  - 1 x daily - 7 x weekly - 3 sets - 10 reps - Alternating Single Leg Bridge  - 1 x daily - 7 x weekly - 3 sets - 10 reps - Bridge on BOSU  - 1 x daily - 7 x weekly - 3 sets - 10 reps    ASSESSMENT:  CLINICAL IMPRESSION: Continued with progression of balance and strengthening exercises to tolerance. Pt definitely more challenged with stepping back to starting position with the stepping exercises. She had LOB a few times on the BOSU ball and required ski poles for assistance. Pt would most likely be able to go on hold at end of POC.   OBJECTIVE IMPAIRMENTS: decreased balance, decreased endurance, difficulty walking, and decreased strength.   ACTIVITY LIMITATIONS: locomotion level  PARTICIPATION LIMITATIONS: driving and occupation  PERSONAL FACTORS: Age and Fitness are also affecting patient's functional outcome.   REHAB POTENTIAL: Good  CLINICAL DECISION MAKING: Stable/uncomplicated  EVALUATION COMPLEXITY: Low   GOALS: Goals reviewed with patient? Yes  SHORT TERM GOALS: Target date: 12/08/2022   Patient will be independent with initial HEP. Baseline:  given 11/30/22 Goal status: MET 12/08/22   LONG TERM GOALS: Target date: 01/05/2023   Patient will be independent with advanced/ongoing HEP to improve outcomes and carryover.  Baseline:  Goal status: IN PROGRESS 12/15/22- advanced.   2.  Patient will score 30/30 on FGA to demonstrate improved dynamic balance and no fall risk.   Baseline: 26/30 Goal status: IN PROGRESS  3.  Patient will be able to maintain SLS x 30 seconds to decrease fall risk.  Baseline: 6 seconds Goal status: IN PROGRESS 12/22/22- able to stand 10 sec without putting opposite leg on ground  4.  Patient will demonstrate 5/5 bil LE strength.  Baseline: RLE weakness compared to LLE Goal status: IN PROGRESS 12/15/22- improving, see objective.   5.  Patient will report >60/80 on LEFS to demonstrate improved functional ability. Baseline: 50/80 Goal status: IN PROGRESS  6.  Patient will demonstrate improved R grip strength by at least 10 pounds.  Baseline: 21# Goal status: MET 12/15/22 35#   PLAN:  PT FREQUENCY: 2x/week  PT DURATION: 6 weeks  PLANNED INTERVENTIONS: Therapeutic exercises, Therapeutic activity, Neuromuscular re-education, Balance training, Gait training, Patient/Family education, Self Care, Stair training, and Re-evaluation  PLAN FOR NEXT SESSION:  check some goals; review HEP   Artist Pais, PTA 12/29/2022, 9:35 AM

## 2022-12-31 ENCOUNTER — Ambulatory Visit (INDEPENDENT_AMBULATORY_CARE_PROVIDER_SITE_OTHER): Payer: 59 | Admitting: Neurology

## 2022-12-31 ENCOUNTER — Encounter: Payer: Self-pay | Admitting: Neurology

## 2022-12-31 ENCOUNTER — Inpatient Hospital Stay: Payer: 59 | Admitting: Neurology

## 2022-12-31 VITALS — BP 121/70 | HR 69 | Ht 62.0 in | Wt 180.0 lb

## 2022-12-31 DIAGNOSIS — G4733 Obstructive sleep apnea (adult) (pediatric): Secondary | ICD-10-CM

## 2022-12-31 DIAGNOSIS — Z8673 Personal history of transient ischemic attack (TIA), and cerebral infarction without residual deficits: Secondary | ICD-10-CM | POA: Insufficient documentation

## 2022-12-31 DIAGNOSIS — E7849 Other hyperlipidemia: Secondary | ICD-10-CM | POA: Diagnosis not present

## 2022-12-31 DIAGNOSIS — I6350 Cerebral infarction due to unspecified occlusion or stenosis of unspecified cerebral artery: Secondary | ICD-10-CM

## 2022-12-31 DIAGNOSIS — G4730 Sleep apnea, unspecified: Secondary | ICD-10-CM | POA: Insufficient documentation

## 2022-12-31 DIAGNOSIS — I6381 Other cerebral infarction due to occlusion or stenosis of small artery: Secondary | ICD-10-CM

## 2022-12-31 NOTE — Progress Notes (Signed)
[facility] [address] [place]. [place] [address] [phone]       OFFICE FOLLOW-UP NOTE  [name] Date of Birth:  08/09/1961 Medical Record Number:  [id number]   HPI: [name] is a pleasant 62 year old Caucasian lady seen today for initial office follow-up visit following hospital consideration for stroke disease.  History is obtained from the patient and review of electronic medical records.  I personally reviewed pertinent available imaging films in PACS.  She has past medical history of hyperlipidemia and difficulty swallowing and presented on 04/08/2022 to [facility] complaining of headache and tingling in the left and fingers..  Her symptoms started with burning sensation in her eyes and a mild headache that lasted a few minutes on.  This returned later on in the and more tingling in her face as well as hands.  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  CT angiogram of the head and neck showed subtle vessel wall irregularity in the mid cervical ICAs jesting possible fibromuscular dysplasia but no large vessel stenosis or lesion.  Minimal calcification of carotid palpitations.  2D echo showed ejection fraction of 55%.  LDL cholesterol was elevated at 139 mg. She was started on dual antiplatelet therapy and transfer to inpatient rehab and she has done well at home.  She feels she is almost back to baseline.  He occasionally feels lightheaded and off-balance he is tired of quickly and needs a further movement.  He has returned to work part-time is mostly working from home with very poor for back to work full-time no.  He is tolerating aspirin well without bruising or bleeding.  Crestor 40 mg daily and having lipid profile checked recently on 14/24 which showed LDL cholesterol to be optimal at 68 mg%.  Her blood pressure is under good control and today it is 121/70.  Patient participated in the  sleep smart stroke prevention study and tested positive for obstructive sleep apnea and was randomized to the CPAP treatment.  She states she is been quite compliant with using the CPAP.  He has been eating healthy and active and has lost some weight.  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  Is not affected.  She has been evaluated by ENT in the past plans on having colonoscopy soon I discussed periprocedural need for holding aspirin slight but acceptable risk of stroke/TIA if she is willing.  She denies any prior history of strokes TIA seizures or significant neurological problems. ROS:   14 system review of systems is positive for tinnitus, lightheadedness, dizziness, imbalance and all other systems negative  PMH:  Past Medical History:  Diagnosis Date   Hypercholesteremia    Stroke ([place]) 11/09/2022    Social History:  Social History   Socioeconomic History   Marital status: Single    Spouse name: Not on file   Number of children: Not on file   Years of education: Not on file   Highest education level: Not on file  Occupational History   Not on file  Tobacco Use   Smoking status: Never   Smokeless tobacco: Never  Vaping Use   Vaping Use: Never used  Substance and Sexual Activity   Alcohol use: Not Currently   Drug use: Never   Sexual activity: Not Currently  Other Topics Concern   Not on file  Social History Narrative  Not on file   Social Determinants of Health   Financial Resource Strain: Not on file  Food Insecurity: No Food Insecurity (11/09/2022)   Hunger Vital Sign    Worried About Running Out of Food in the Last Year: Never true    Ran Out of Food in the Last Year: Never true  Transportation Needs: No Transportation Needs (11/09/2022)   PRAPARE - Hydrologist (Medical): No    Lack of Transportation (Non-Medical): No  Physical Activity: Not on file  Stress: Not on file  Social  Connections: Not on file  Intimate Partner Violence: Not At Risk (11/09/2022)   Humiliation, Afraid, Rape, and Kick questionnaire    Fear of Current or Ex-Partner: No    Emotionally Abused: No    Physically Abused: No    Sexually Abused: No    Medications:   Current Outpatient Medications on File Prior to Visit  Medication Sig Dispense Refill   acetaminophen (TYLENOL) 325 MG tablet Take 1-2 tablets (325-650 mg total) by mouth every 4 (four) hours as needed for mild pain.     aspirin EC 81 MG tablet Take 1 tablet (81 mg total) by mouth daily. Swallow whole. 30 tablet 12   magnesium gluconate (MAGONATE) 500 MG tablet Take 0.5 tablets (250 mg total) by mouth at bedtime. 15 tablet 0   rosuvastatin (CRESTOR) 40 MG tablet Take 1 tablet (40 mg total) by mouth daily. 30 tablet 0   Vitamin D, Ergocalciferol, (DRISDOL) 1.25 MG (50000 UNIT) CAPS capsule TAKE 1 CAPSULE (50,000 UNITS TOTAL) BY MOUTH EVERY 7 (SEVEN) DAYS. EACH WEDNESDAY. 12 capsule 1   No current facility-administered medications on file prior to visit.    Allergies:   Allergies  Allergen Reactions   Lipitor [Atorvastatin]     Muscle aches    Physical Exam General: Mildly obese pleasant middle-age Caucasian lady, seated, in no evident distress Head: head normocephalic and atraumatic.  Neck: supple with no carotid or supraclavicular bruits Cardiovascular: regular rate and rhythm, no murmurs Musculoskeletal: no deformity Skin:  no rash/petichiae Vascular:  Normal pulses all extremities Vitals:   12/31/22 0826  BP: 121/70  Pulse: 69   Neurologic Exam Mental Status: Awake and fully alert. Oriented to place and time. Recent and remote memory intact. Attention span, concentration and fund of knowledge appropriate. Mood and affect appropriate.  Cranial Nerves: Fundoscopic exam reveals sharp disc margins. Pupils equal, briskly reactive to light. Extraocular movements full without nystagmus. Visual fields full to confrontation.  Hearing intact. Facial sensation intact. Face, tongue, palate moves normally and symmetrically.  Motor: Normal bulk and tone. Normal strength in all tested extremity muscles. Sensory.: intact to touch ,pinprick .position and vibratory sensation.  Coordination: Rapid alternating movements normal in all extremities. Finger-to-nose and heel-to-shin performed accurately bilaterally. Gait and Station: Arises from chair without difficulty. Stance is normal. Gait demonstrates normal stride length and balance . Able to heel, toe and tandem walk without difficulty.  Reflexes: 1+ and symmetric. Toes downgoing.   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  Vascular risk factors of hyperlipidemia, obstructive sleep apnea.  Patient is participating in the sleep smart stroke prevention study CPAP treatment     PLAN:I had a long d/w patient about her recent Pontine lacunar strokes, risk for recurrent stroke/TIAs, personally independently reviewed imaging studies and stroke evaluation results and answered questions.Continue aspirin 81 mg  daily  for secondary stroke prevention and maintain strict control of hypertension with blood pressure goal below 130/90, diabetes with hemoglobin A1c goal below 6.5% and lipids with LDL cholesterol goal below 70 mg/dL. I also advised the patient to eat a healthy diet with plenty of whole grains, cereals, fruits and vegetables, exercise regularly and maintain ideal body weight .she was also advised to be compliant with using her CPAP for her sleep apnea treatment.  Continue participation in the sleep smart stroke prevention study.  Followup in the future with my nurse practitioner in 6 months or call earlier if necessary.  Greater than 50% of time during this 35 minute visit was spent on counseling,explanation of diagnosis of lacunar stroke and sleep apnea, planning of  further management, discussion with patient and family and coordination of care Antony Contras, MD Note: This document was prepared with digital dictation and possible smart phrase technology. Any transcriptional errors that result from this process are unintentional

## 2022-12-31 NOTE — Patient Instructions (Signed)
I had a long d/w patient about her recent Pontine lacunar strokes, risk for recurrent stroke/TIAs, personally independently reviewed imaging studies and stroke evaluation results and answered questions.Continue aspirin 81 mg daily  for secondary stroke prevention and maintain strict control of hypertension with blood pressure goal below 130/90, diabetes with hemoglobin A1c goal below 6.5% and lipids with LDL cholesterol goal below 70 mg/dL. I also advised the patient to eat a healthy diet with plenty of whole grains, cereals, fruits and vegetables, exercise regularly and maintain ideal body weight .she was also advised to be compliant with using her CPAP for her sleep apnea treatment.  Continue participation in the sleep smart stroke prevention study.  Followup in the future with my nurse practitioner in 6 months or call earlier if necessary.  Stroke Prevention Some medical conditions and behaviors can lead to a higher chance of having a stroke. You can help prevent a stroke by eating healthy, exercising, not smoking, and managing any medical conditions you have. Stroke is a leading cause of functional impairment. Primary prevention is particularly important because a majority of strokes are first-time events. Stroke changes the lives of not only those who experience a stroke but also their family and other caregivers. How can this condition affect me? A stroke is a medical emergency and should be treated right away. A stroke can lead to brain damage and can sometimes be life-threatening. If a person gets medical treatment right away, there is a better chance of surviving and recovering from a stroke. What can increase my risk? The following medical conditions may increase your risk of a stroke: Cardiovascular disease. High blood pressure (hypertension). Diabetes. High cholesterol. Sickle cell disease. Blood clotting disorders (hypercoagulable state). Obesity. Sleep disorders (obstructive sleep  apnea). Other risk factors include: Being older than age 62. Having a history of blood clots, stroke, or mini-stroke (transient ischemic attack, TIA). Genetic factors, such as race, ethnicity, or a family history of stroke. Smoking cigarettes or using other tobacco products. Taking birth control pills, especially if you also use tobacco. Heavy use of alcohol or drugs, especially cocaine and methamphetamine. Physical inactivity. What actions can I take to prevent this? Manage your health conditions High cholesterol levels. Eating a healthy diet is important for preventing high cholesterol. If cholesterol cannot be managed through diet alone, you may need to take medicines. Take any prescribed medicines to control your cholesterol as told by your health care provider. Hypertension. To reduce your risk of stroke, try to keep your blood pressure below 130/80. Eating a healthy diet and exercising regularly are important for controlling blood pressure. If these steps are not enough to manage your blood pressure, you may need to take medicines. Take any prescribed medicines to control hypertension as told by your health care provider. Ask your health care provider if you should monitor your blood pressure at home. Have your blood pressure checked every year, even if your blood pressure is normal. Blood pressure increases with age and some medical conditions. Diabetes. Eating a healthy diet and exercising regularly are important parts of managing your blood sugar (glucose). If your blood sugar cannot be managed through diet and exercise, you may need to take medicines. Take any prescribed medicines to control your diabetes as told by your health care provider. Get evaluated for obstructive sleep apnea. Talk to your health care provider about getting a sleep evaluation if you snore a lot or have excessive sleepiness. Make sure that any other medical conditions you have, such as  atrial fibrillation or  atherosclerosis, are managed. Nutrition Follow instructions from your health care provider about what to eat or drink to help manage your health condition. These instructions may include: Reducing your daily calorie intake. Limiting how much salt (sodium) you use to 1,500 milligrams (mg) each day. Using only healthy fats for cooking, such as olive oil, canola oil, or sunflower oil. Eating healthy foods. You can do this by: Choosing foods that are high in fiber, such as whole grains, and fresh fruits and vegetables. Eating at least 5 servings of fruits and vegetables a day. Try to fill one-half of your plate with fruits and vegetables at each meal. Choosing lean protein foods, such as lean cuts of meat, poultry without skin, fish, tofu, beans, and nuts. Eating low-fat dairy products. Avoiding foods that are high in sodium. This can help lower blood pressure. Avoiding foods that have saturated fat, trans fat, and cholesterol. This can help prevent high cholesterol. Avoiding processed and prepared foods. Counting your daily carbohydrate intake.  Lifestyle If you drink alcohol: Limit how much you have to: 0-1 drink a day for women who are not pregnant. 0-2 drinks a day for men. Know how much alcohol is in your drink. In the [place] one drink equals one 12 oz bottle of beer (345m), one 5 oz glass of wine (1418m, or one 1 oz glass of hard liquor (4453m Do not use any products that contain nicotine or tobacco. These products include cigarettes, chewing tobacco, and vaping devices, such as e-cigarettes. If you need help quitting, ask your health care provider. Avoid secondhand smoke. Do not use drugs. Activity  Try to stay at a healthy weight. Get at least 30 minutes of exercise on most days, such as: Fast walking. Biking. Swimming. Medicines Take over-the-counter and prescription medicines only as told by your health care provider. Aspirin or blood thinners (antiplatelets or  anticoagulants) may be recommended to reduce your risk of forming blood clots that can lead to stroke. Avoid taking birth control pills. Talk to your health care provider about the risks of taking birth control pills if: You are over 35 55ars old. You smoke. You get very bad headaches. You have had a blood clot. Where to find more information American Stroke Association: www.strokeassociation.org Get help right away if: You or a loved one has any symptoms of a stroke. "BE FAST" is an easy way to remember the main warning signs of a stroke: B - Balance. Signs are dizziness, sudden trouble walking, or loss of balance. E - Eyes. Signs are trouble seeing or a sudden change in vision. F - Face. Signs are sudden weakness or numbness of the face, or the face or eyelid drooping on one side. A - Arms. Signs are weakness or numbness in an arm. This happens suddenly and usually on one side of the body. S - Speech. Signs are sudden trouble speaking, slurred speech, or trouble understanding what people say. T - Time. Time to call emergency services. Write down what time symptoms started. You or a loved one has other signs of a stroke, such as: A sudden, severe headache with no known cause. Nausea or vomiting. Seizure. These symptoms may represent a serious problem that is an emergency. Do not wait to see if the symptoms will go away. Get medical help right away. Call your local emergency services (911 in the [place] Do not drive yourself to the hospital. Summary You can help to prevent a stroke by eating healthy, exercising, not  smoking, limiting alcohol intake, and managing any medical conditions you may have. Do not use any products that contain nicotine or tobacco. These include cigarettes, chewing tobacco, and vaping devices, such as e-cigarettes. If you need help quitting, ask your health care provider. Remember "BE FAST" for warning signs of a stroke. Get help right away if you or a loved one has any  of these signs. This information is not intended to replace advice given to you by your health care provider. Make sure you discuss any questions you have with your health care provider. Document Revised: 05/27/2020 Document Reviewed: 05/27/2020 Elsevier Patient Education  [place].

## 2023-01-01 ENCOUNTER — Ambulatory Visit: Payer: 59

## 2023-01-01 DIAGNOSIS — R2681 Unsteadiness on feet: Secondary | ICD-10-CM

## 2023-01-01 DIAGNOSIS — M6281 Muscle weakness (generalized): Secondary | ICD-10-CM

## 2023-01-01 DIAGNOSIS — I6381 Other cerebral infarction due to occlusion or stenosis of small artery: Secondary | ICD-10-CM

## 2023-01-01 NOTE — Therapy (Signed)
OUTPATIENT PHYSICAL THERAPY TREATMENT   Patient Name: Joy Shelton MRN: [id number] DOB:06-Oct-1961, 62 y.o., female Today's Date: 01/01/2023  END OF SESSION:  PT End of Session - 01/01/23 0945     Visit Number 9    Number of Visits 12    Date for PT Re-Evaluation 01/05/23    Authorization Type UHC [id number]    PT Start Time 0850    PT Stop Time 0930    PT Time Calculation (min) 40 min    Activity Tolerance Patient tolerated treatment well    Behavior During Therapy [facility] for tasks assessed/performed                Past Medical History:  Diagnosis Date   Hypercholesteremia    Stroke ([place]) 11/09/2022   History reviewed. No pertinent surgical history. Patient Active Problem List   Diagnosis Date Noted   History of CVA (cerebrovascular accident) 12/31/2022   Sleep apnea 12/31/2022   Lacunar infarct, acute ([place]) 11/13/2022   CVA (cerebral vascular accident) ([place]) 11/09/2022   Hyperlipidemia 11/09/2022   Obesity (BMI 30-39.9) 11/09/2022   Sensorineural hearing loss (SNHL), bilateral 06/10/2021   Tinnitus of both ears 06/10/2021    PCP: Harlan Stains, MD  REFERRING PROVIDER: Izora Ribas, MD  REFERRING DIAG: I63.81 CVA   THERAPY DIAG:  Muscle weakness (generalized)  Unsteadiness on feet  Lacunar infarct, acute ([place])  Rationale for Evaluation and Treatment: Rehabilitation  ONSET DATE: 11/08/2022   SUBJECTIVE:   SUBJECTIVE STATEMENT:  No new complaints.  PERTINENT HISTORY: CVA- lacunar infarct pons 11/08/22 PAIN:  Are you having pain? No   PRECAUTIONS: None  WEIGHT BEARING RESTRICTIONS: No  FALLS:  Has patient fallen in last 6 months? 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    NEXT  MD VISIT: 12/25/22 - hospital follow-up, 12/31/22- neurologist   OBJECTIVE:   DIAGNOSTIC FINDINGS: MRI Brain 11/09/22 IMPRESSION: 1. 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 No associated hemorrhage or mass effect.   2. Elsewhere stable and normal for age MRI appearance of the brain.  PATIENT SURVEYS:  Stroke Impact Scale 20/80 LEFS 50/80 = 62.5%  COGNITION: Overall cognitive status: Within functional limits for tasks assessed     SENSATION: WFL  POSTURE:  No Significant postural limitations  UPPER EXTREMITY MMT:  MMT Right eval Left eval  Shoulder flexion 5 5  Shoulder extension    Shoulder abduction 5 5  Shoulder internal rotation 5 5  Shoulder external rotation 5 5  Elbow flexion 5 5  Elbow extension 5 5  Wrist flexion 5 5  Wrist extension 5 5  Wrist pronation 5 5  Wrist supination 5 5  Grip strength fair good   (Blank rows = not tested)   LOWER EXTREMITY MMT:  MMT Right eval Left eval Right 12/15/2022 Left  12/15/22 Right 01/01/23 Left 01/01/23  Hip flexion 4 4+ 4+ 5 4+ 5  Hip extension 4 4+ 4 4+ 5 5  Hip abduction 4+ 4+ 5 5    Hip adduction '4 4 5 5    '$ Knee flexion 4 4+ 5 5    Knee extension '5 5 5 5    '$ Ankle dorsiflexion 4+ 5 5 5  Ankle plantarflexion '5 5 5 5     '$ (Blank rows = not tested)  UPPER EXTREMITY MMT:  MMT Right 11/30/22 Left 11/30/22 Right 12/15/22 Left 12/15/22  Grip strength 21# 13# 35# 25#     COORDINATION:  Normal UE and LE - Repeated heel to shin, toe taps, alternating arm supination/pronation, thumb to fingers, all normal.   FUNCTIONAL TESTS:  5 times sit to stand: 11 seconds without UE assist Functional gait assessment: 26/30 Tandem 30 sec each side, single leg stance 6 sec each side   GAIT: Distance walked: 250' Assistive device utilized: None Level of assistance: Complete Independence Comments: no deviation   TODAY'S TREATMENT:                                                                                                                               DATE:  01/01/23 Therapeutic Exercise: to improve strength and mobility.  Demo, verbal and tactile cues throughout for technique. Bike L2x63mn  Neuromuscular Reeducation: to improve balance and stability. SBA for safety throughout.  Functional Gait Assessment: 30 / 30 = 100.0 % ON airex: SLS Tandem stance  Clock balance  12/29/22 Therapeutic Exercise: to improve strength and mobility.  Demo, verbal and tactile cues throughout for technique. Nustep L6 x 6 min  Standing hip abduction GTB x 10  Standing hip ext GTB x 10  Sidestep GTB 2x25 ft  Neuromuscular Reeducation: to improve balance and stability. SBA for safety throughout.  Standing on flat side of BOSU: Fwd/Back WS x 10 each Circular movement weight shifting x 10 Fwd steps starting on airex stepping to floor x 10 bil Toe taps from airex to different color cones, calling out cone color and foot for patient to tap  Backward stepping from airex with arm raise x 10   12/24/22 Therapeutic Exercise: to improve strength and mobility.  Demo, verbal and tactile cues throughout for technique. Nustep L5 x 6 min  Lunges forward 2 x 10 with one foot on bosu Side lunges 2 x 10 bil with one foot on bosu Bridges x 10 Single leg bridges x 10 Bridges on bosu x 10  Neuromuscular Reeducation: to improve balance and stability. SBA for safety throughout.  Standing on flat side of bosu - side to side weight shifts, mini-squats, then squatting to reach bean bags on stool and throwing.  SLS x 26 sec L, x 15 sec R   12/22/22 Therapeutic Exercise: to improve strength and mobility.  Demo, verbal and tactile cues throughout for technique. Nustep L5 x 6 min Rocker board all directions x 10  Seated LAQ 2x10 with RTB bil Seated HS curls RTB 2x10 bil  NEUROMUSCULAR RE-EDUCATION: To improve posture, balance, and kinesthesia. Tandem walk along balance beam 6x Braiding along balance  beam 4x Tandem stance on balance beam with perturbations 2x for 10 sec Clock balance standing on foam oval all the way around 2x each LE R/L SLS x 30 sec  12/17/22 Therapeutic Exercise: to improve strength and mobility.  Demo, verbal and tactile cues throughout for technique. Nustep L5 x 6 min  Single leg RDL x 10 bil Squats 2x10 Pall[place] press blue TB 2x10 Trunk rotation x 10 blue TB Prone hip extension x 10 bil Standing hip flexion, abduction, extension RTB x 10 each  NEUROMUSCULAR RE-EDUCATION: To improve posture, balance, and kinesthesia. Toe taps from airex pad: Fwd and lateral both sides x 10 each; progressed another set increasing distance from airex to target Tandem walk fwd and back 2x20 ft Bird dog 2x5 bil     PATIENT EDUCATION:  Education details: HEP update  Person educated: Patient Education method: Explanation, Demonstration, and Handouts Education comprehension: verbalized understanding, returned demonstration, and needs further education  HOME EXERCISE PROGRAM: Access Code: [email or web address] URL: [email or web address] Date: 12/24/2022 Prepared by: Glenetta Hew  Exercises Added   - [place]  - 1 x daily - 7 x weekly - 3 sets - 10 reps - Lateral Step Up and Overs on BOSU  - 1 x daily - 7 x weekly - 3 sets - 10 reps - Beginner Bridge  - 1 x daily - 7 x weekly - 3 sets - 10 reps - Alternating Single Leg Bridge  - 1 x daily - 7 x weekly - 3 sets - 10 reps - Bridge on BOSU  - 1 x daily - 7 x weekly - 3 sets - 10 reps    ASSESSMENT:  CLINICAL IMPRESSION: Pt has scored 30/30 on FGA today meeting LTG #2. R hip flexor strength is still 4+/5 but all other muscle groups are 5/5. Reviewed balance exercises that she could do on airex pad and provided print outs of these. We also reviewed HEP and talked about other fitness options to keep active in future.   OBJECTIVE IMPAIRMENTS: decreased balance, decreased endurance, difficulty walking, and  decreased strength.   ACTIVITY LIMITATIONS: locomotion level  PARTICIPATION LIMITATIONS: driving and occupation  PERSONAL FACTORS: Age and Fitness are also affecting patient's functional outcome.   REHAB POTENTIAL: Good  CLINICAL DECISION MAKING: Stable/uncomplicated  EVALUATION COMPLEXITY: Low   GOALS: Goals reviewed with patient? Yes  SHORT TERM GOALS: Target date: 12/08/2022   Patient will be independent with initial HEP. Baseline: given 11/30/22 Goal status: MET 12/08/22   LONG TERM GOALS: Target date: 01/05/2023   Patient will be independent with advanced/ongoing HEP to improve outcomes and carryover.  Baseline:  Goal status: IN PROGRESS 12/15/22- advanced.   2.  Patient will score 30/30 on FGA to demonstrate improved dynamic balance and no fall risk.   Baseline: 26/30 Goal status: IN PROGRESS  3.  Patient will be able to maintain SLS x 30 seconds to decrease fall risk.  Baseline: 6 seconds Goal status: IN PROGRESS 12/22/22- able to stand 10 sec without putting opposite leg on ground  4.  Patient will demonstrate 5/5 bil LE strength.  Baseline: RLE weakness compared to LLE Goal status: IN PROGRESS 01/01/23 R hip flexors still 4+/5  5.  Patient will report >60/80 on LEFS to demonstrate improved functional ability. Baseline: 50/80 Goal status: IN PROGRESS  6.  Patient will demonstrate improved R grip strength by at least 10 pounds.  Baseline: 21# Goal status: MET 12/15/22 35#   PLAN:  PT FREQUENCY: 2x/week  PT DURATION: 6 weeks  PLANNED INTERVENTIONS: Therapeutic exercises, Therapeutic activity, Neuromuscular re-education, Balance training, Gait training, Patient/Family education, Self Care, Stair training, and Re-evaluation  PLAN FOR NEXT SESSION: check goals;review  HEP; discharge   Artist Pais, PTA 01/01/2023, 9:46 AM

## 2023-01-05 ENCOUNTER — Encounter: Payer: Self-pay | Admitting: Physical Therapy

## 2023-01-05 ENCOUNTER — Ambulatory Visit: Payer: 59 | Admitting: Physical Therapy

## 2023-01-05 DIAGNOSIS — R2681 Unsteadiness on feet: Secondary | ICD-10-CM

## 2023-01-05 DIAGNOSIS — I6381 Other cerebral infarction due to occlusion or stenosis of small artery: Secondary | ICD-10-CM

## 2023-01-05 DIAGNOSIS — M6281 Muscle weakness (generalized): Secondary | ICD-10-CM | POA: Diagnosis not present

## 2023-01-05 NOTE — Therapy (Signed)
OUTPATIENT PHYSICAL THERAPY TREATMENT PHYSICAL THERAPY DISCHARGE SUMMARY  Visits from Start of Care: 10  Current functional level related to goals / functional outcomes: Significantly improved strength and balance scoring 96.3% on LEFS and 30/30 FGA   Remaining deficits: Slight weakness R hip flexors, difficulty maintaining SLS on R compared to L (15 sec compared to 30 sec).    Education / Equipment: HEP  Plan: Patient agrees to discharge.  Patient is being discharged due to meeting the stated rehab goals.        Patient Name: Joy Shelton MRN: [id number] DOB:06-28-1961, 62 y.o., female Today's Date: 01/05/2023  END OF SESSION:  PT End of Session - 01/05/23 0905     Visit Number 10    Number of Visits 12    Date for PT Re-Evaluation 01/05/23    Authorization Type UHC [id number]    PT Start Time [id number]    PT Stop Time 0929    PT Time Calculation (min) 42 min    Activity Tolerance Patient tolerated treatment well    Behavior During Therapy [facility] for tasks assessed/performed                Past Medical History:  Diagnosis Date   Hypercholesteremia    Stroke ([place]) 11/09/2022   History reviewed. No pertinent surgical history. Patient Active Problem List   Diagnosis Date Noted   History of CVA (cerebrovascular accident) 12/31/2022   Sleep apnea 12/31/2022   Lacunar infarct, acute ([place]) 11/13/2022   CVA (cerebral vascular accident) ([place]) 11/09/2022   Hyperlipidemia 11/09/2022   Obesity (BMI 30-39.9) 11/09/2022   Sensorineural hearing loss (SNHL), bilateral 06/10/2021   Tinnitus of both ears 06/10/2021    PCP: Harlan Stains, MD  REFERRING PROVIDER: Izora Ribas, MD  REFERRING DIAG: I63.81 CVA   THERAPY DIAG:  Muscle weakness (generalized)  Unsteadiness on feet  Lacunar infarct, acute [facility])  Rationale for Evaluation and Treatment: Rehabilitation  ONSET DATE: 11/08/2022   SUBJECTIVE:   SUBJECTIVE STATEMENT:  Joy Shelton reports she is  doing well.   PERTINENT HISTORY: CVA- lacunar infarct pons 11/08/22 PAIN:  Are you having pain? No   PRECAUTIONS: None  WEIGHT BEARING RESTRICTIONS: No  FALLS:  Has patient fallen in last 6 months? 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    NEXT MD VISIT: 12/25/22 - hospital follow-up, 12/31/22- neurologist   OBJECTIVE:   DIAGNOSTIC FINDINGS: MRI Brain 11/09/22 IMPRESSION: 1. 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 No associated hemorrhage or mass effect.   2. Elsewhere stable and normal for age MRI appearance of the brain.  PATIENT SURVEYS:  Stroke Impact Scale 20/80 LEFS 50/80 = 62.5%  COGNITION: Overall cognitive status: Within functional limits for tasks assessed     SENSATION: WFL  POSTURE:  No Significant postural limitations  UPPER EXTREMITY MMT:  MMT Right eval Left eval  Shoulder flexion 5 5  Shoulder extension    Shoulder abduction 5 5  Shoulder internal rotation 5 5  Shoulder external rotation 5 5  Elbow flexion 5 5  Elbow extension 5 5  Wrist flexion 5 5  Wrist extension 5 5  Wrist pronation 5 5  Wrist supination 5 5  Grip  strength fair good   (Blank rows = not tested)   LOWER EXTREMITY MMT:  MMT Right eval Left eval Right 12/15/2022 Left  12/15/22 Right 01/01/23 Left 01/01/23  Hip flexion 4 4+ 4+ 5 4+ 5  Hip extension 4 4+ 4 4+ 5 5  Hip abduction 4+ 4+ 5 5    Hip adduction '4 4 5 5    '$ Knee flexion 4 4+ 5 5    Knee extension '5 5 5 5    '$ Ankle dorsiflexion 4+ '5 5 5    '$ Ankle plantarflexion '5 5 5 5     '$ (Blank rows = not tested)  UPPER EXTREMITY MMT:  MMT Right 11/30/22 Left 11/30/22 Right 12/15/22 Left 12/15/22  Grip strength 21# 13# 35# 25#      COORDINATION:  Normal UE and LE - Repeated heel to shin, toe taps, alternating arm supination/pronation, thumb to fingers, all normal.   FUNCTIONAL TESTS:  5 times sit to stand: 11 seconds without UE assist Functional gait assessment: 26/30 Tandem 30 sec each side, single leg stance 6 sec each side   GAIT: Distance walked: 250' Assistive device utilized: None Level of assistance: Complete Independence Comments: no deviation   TODAY'S TREATMENT:                                                                                                                              DATE:   01/05/23 Therapeutic Exercise: to improve strength and mobility.  Demo, verbal and tactile cues throughout for technique. Treadmill x 15 min 2.3 mph (while discussing diet, exercise and recommendations from neurologist) Review of HEP Theraband 3-direction lunges with RTB focusing on keeping weight on stance leg x 5 each side Therapeutic Activity:  assessing progress towards goals, answering questions and concerns, LEFS   01/01/23 Therapeutic Exercise: to improve strength and mobility.  Demo, verbal and tactile cues throughout for technique. Bike L2x69mn  Neuromuscular Reeducation: to improve balance and stability. SBA for safety throughout.  Functional Gait Assessment: 30 / 30 = 100.0 % ON airex: SLS Tandem stance  Clock balance  12/29/22 Therapeutic Exercise: to improve strength and mobility.  Demo, verbal and tactile cues throughout for technique. Nustep L6 x 6 min  Standing hip abduction GTB x 10  Standing hip ext GTB x 10  Sidestep GTB 2x25 ft  Neuromuscular Reeducation: to improve balance and stability. SBA for safety throughout.  Standing on flat side of BOSU: Fwd/Back WS x 10 each Circular movement weight shifting x 10 Fwd steps starting on airex stepping to floor x 10 bil Toe taps from airex to different color cones, calling out cone color and foot for patient to tap  Backward stepping  from airex with arm raise x 10   12/24/22 Therapeutic Exercise: to improve strength and mobility.  Demo, verbal and tactile cues throughout for technique. Nustep L5 x 6 min  Lunges forward 2 x 10 with one foot on bosu Side lunges 2  x 10 bil with one foot on bosu Bridges x 10 Single leg bridges x 10 Bridges on bosu x 10  Neuromuscular Reeducation: to improve balance and stability. SBA for safety throughout.  Standing on flat side of bosu - side to side weight shifts, mini-squats, then squatting to reach bean bags on stool and throwing.  SLS x 26 sec L, x 15 sec R   12/22/22 Therapeutic Exercise: to improve strength and mobility.  Demo, verbal and tactile cues throughout for technique. Nustep L5 x 6 min Rocker board all directions x 10  Seated LAQ 2x10 with RTB bil Seated HS curls RTB 2x10 bil  NEUROMUSCULAR RE-EDUCATION: To improve posture, balance, and kinesthesia. Tandem walk along balance beam 6x Braiding along balance beam 4x Tandem stance on balance beam with perturbations 2x for 10 sec Clock balance standing on foam oval all the way around 2x each LE R/L SLS x 30 sec  PATIENT EDUCATION:  Education details: HEP update  Person educated: Patient Education method: Explanation, Demonstration, and Handouts Education comprehension: verbalized understanding, returned demonstration, and needs further education  HOME EXERCISE PROGRAM: Access Code: [email or web address] URL: [email or web address] Date: 12/24/2022 Prepared by: Glenetta Hew  Exercises Added   - [place]  - 1 x daily - 7 x weekly - 3 sets - 10 reps - Lateral Step Up and Overs on BOSU  - 1 x daily - 7 x weekly - 3 sets - 10 reps - Beginner Bridge  - 1 x daily - 7 x weekly - 3 sets - 10 reps - Alternating Single Leg Bridge  - 1 x daily - 7 x weekly - 3 sets - 10 reps - Bridge on BOSU  - 1 x daily - 7 x weekly - 3 sets - 10 reps    ASSESSMENT:  CLINICAL IMPRESSION: Joy Shelton has made  good progress in PT and has met LTG #1, 2, 5 and 6, and making good progress towards remaining goals.  She was able to maintain SLS x 30 sec on L foot today, and 15 sec on R foot.  She is still slightly weaker in R hip flexors (4+/5) which was her affected side, but all other muscle groups 5/5 and she is at low risk for falls, scoring 30/30 on FGA.  She reports 96.3% ability on LEFS and 100% on SIS-16, significant improvements from initial evaluation.  Today discussed diet, activity and exercise at length.  She is ready and agreeable to discharge today.    OBJECTIVE IMPAIRMENTS: decreased balance, decreased endurance, difficulty walking, and decreased strength.   ACTIVITY LIMITATIONS: locomotion level  PARTICIPATION LIMITATIONS: driving and occupation  PERSONAL FACTORS: Age and Fitness are also affecting patient's functional outcome.   REHAB POTENTIAL: Good  CLINICAL DECISION MAKING: Stable/uncomplicated  EVALUATION COMPLEXITY: Low   GOALS: Goals reviewed with patient? Yes  SHORT TERM GOALS: Target date: 12/08/2022   Patient will be independent with initial HEP. Baseline: given 11/30/22 Goal status: MET 12/08/22   LONG TERM GOALS: Target date: 01/05/2023   Patient will be independent with advanced/ongoing HEP to improve outcomes and carryover.  Baseline:  Goal status: MET 12/15/22- advanced.  01/05/23- met  2.  Patient will score 30/30 on FGA to demonstrate improved dynamic balance and no fall risk.   Baseline: 26/30 Goal status: MET 01/01/23- 30/30  3.  Patient will be able to maintain SLS x 30 seconds to decrease fall risk.  Baseline: 6 seconds Goal status: IN PROGRESS 12/22/22- able to stand  10 sec without putting opposite leg on ground 01/05/23- 30 sec L SLS, 15 sec R SLS   4.  Patient will demonstrate 5/5 bil LE strength.  Baseline: RLE weakness compared to LLE Goal status: IN PROGRESS 01/01/23 R hip flexors still 4+/5  5.  Patient will report >60/80 on LEFS to demonstrate  improved functional ability. Baseline: 50/80 Goal status: MET 77/80 96.3%  6.  Patient will demonstrate improved R grip strength by at least 10 pounds.  Baseline: 21# Goal status: MET 12/15/22 35#   PLAN:  PT FREQUENCY: 2x/week  PT DURATION: 6 weeks  PLANNED INTERVENTIONS: Therapeutic exercises, Therapeutic activity, Neuromuscular re-education, Balance training, Gait training, Patient/Family education, Self Care, Stair training, and Re-evaluation  PLAN FOR NEXT SESSION: discharged to [place], PT, DPT  01/05/2023, 10:49 AM

## 2023-01-11 ENCOUNTER — Inpatient Hospital Stay: Payer: 59 | Admitting: Neurology

## 2023-02-22 ENCOUNTER — Encounter: Payer: 59 | Attending: Physical Medicine and Rehabilitation | Admitting: Physical Medicine and Rehabilitation

## 2023-02-22 VITALS — BP 129/74 | HR 67 | Ht 62.0 in | Wt 171.0 lb

## 2023-02-22 DIAGNOSIS — I773 Arterial fibromuscular dysplasia: Secondary | ICD-10-CM | POA: Diagnosis not present

## 2023-02-22 DIAGNOSIS — R42 Dizziness and giddiness: Secondary | ICD-10-CM | POA: Insufficient documentation

## 2023-02-22 DIAGNOSIS — R7301 Impaired fasting glucose: Secondary | ICD-10-CM | POA: Diagnosis not present

## 2023-02-22 DIAGNOSIS — E7849 Other hyperlipidemia: Secondary | ICD-10-CM | POA: Insufficient documentation

## 2023-02-22 NOTE — Patient Instructions (Addendum)
-  try to incorporate into your diet some of the following foods: 1) cinnamon- imitates effects of insulin, increasing glucose transport into cells ([place] or [place] cinnamon is best, least processed) 2) nuts- can slow down the blood sugar response of carbohydrate rich foods 3) oatmeal- contains and anti-inflammatory compound avenanthramide 4) whole-milk yogurt (best types are no sugar, [place] yogurt, or goat/sheep yogurt) 5) beans- high in protein, fiber, and vitamins, low glycemic index 6) broccoli- great source of vitamin A and C 7) quinoa- higher in protein and fiber than other grains 8) spinach- high in vitamin A, fiber, and protein 9) olive oil- reduces glucose levels, LDL, and triglycerides 10) salmon- excellent amount of omega-3-fatty acids 11) walnuts- rich in antioxidants 12) apples- high in fiber and quercetin 13) carrots- highly nutritious with low impact on blood sugar 14) eggs- improve HDL (good cholesterol), high in protein, keep you satiated 15) turmeric: improves blood sugars, cardiovascular disease, and protects kidney health 16) garlic: improves blood sugar, blood pressure, pain 17) tomatoes: highly nutritious with low impact on blood sugar    Provided with list of supplements that can help with dyslipidemia: 1) Vitamin B3 500-4,000mg  in divided doses daily (would recommend starting low as can cause uncomfortable facial flushing if started at too high a dose) 2) Phytosterols 2.15 grams daily 3) Fermented soy 30-50 grams daily 4) EGCG (found in green tea): 500-1000mg  daily 5) Omega-3 fatty acids 3000-5,000mg  daily 6) Flax seed 40 grams daily 7) Monounsaturated fats 20-40 grams daily (olives, olive oil, nuts), also reduces cardiovascular disease 8) Sesame: 40 grams daily 9) Gamma/delta tocotrienols- a family of unsaturated forms of Vitamin E- 200mg  with dinner 10) Pantethine 900mg  daily in divided doses 11) Resveratrol 250mg  daily 12) N Acetyl Cysteine 2000mg  daily  in divided doses 13) Curcumin 2000-5000mg  in divided doses daily 14) Pomegranate juice: 8 ounces daily, also helps to lower blood pressure 15) Pomegranate seeds one cup daily, also helps to lower blood pressure 16) Citrus Bergamot 1000mg  daily, also helps with glucose control and weight loss 17) Vitamin C 500mg  daily 18) Quercetin 500-1000mg  daily 19) Glutathione 20) Probiotics 60-100 billion organisms per day 21) Fiber 22) Oats 23) Aged garlic (can eat as food or supplement of 600-900mg  per day) 24) Chia seeds 25 grams per day 25) Lycopene- carotenoid found in high concentrations in tomatoes. 26) Alpha linolenic acid 27) Flavonoids and anthocyanins 28) Wogonin- flavanoid that enhances reverse cholesterol transport 29) Coenzyme Q10 30) Pantethine- derivative of Vitamin B5: 300mg  three times per day or 450mg  twice per day with or without food 31) Barley and other whole grains 32) Orange juice 33) L- carnitine 34) L- Lysine 35) L- Arginine 36) Almonds 37) Morin 38) Rutin 39) Carnosine 40) Histidine  41) Kaempferol  42) Organosulfur compounds 43) Vitamin E 44) Oleic acid 45) RBO (ferulic acid gammaoryzanol) 46) grape seed extract 47) Red wine 48) Berberine HCL 500mg  daily or twice per day- more effective and with fewer adverse effects that ezetimibe monotherapy 49) red yeast rice 2400- 4800 mg/day 50) chlorella 51) Licorice

## 2023-02-22 NOTE — Progress Notes (Signed)
Subjective:    Patient ID: Joy Shelton, female    DOB: 1960/12/15, 62 y.o.   MRN: [id number]  HPI  1) Insomnia -wakes at 4am -not getting enough sunlight exposure during the day  2) Lip numbness  3) Muscle cramps -present more at night  4) Urinary urgency:  -she wears Depends -she has about a minute after she feels the urge to urinate before she urinates -she is only incontinent when there is no bathroom near her -during the day she is going to the bathroom at a normal frequency  5) Daytime somnolence  6) Lightheaded and dizzy in the morning -this is usually when she looks sideways -she feels her eyes has to catch up to her head moving -denies room spinning -started in the last week -feels she has widened her walking stance  7) Elevated fasting blood sugar -ranging 95-111  Pain Inventory Average Pain 1 Pain Right Now 0 My pain is dull and aching  LOCATION OF PAIN  Hip, left side   BOWEL Number of stools per week: 5-7  Incontinent Yes   BLADDER Pads Bladder incontinence Yes  Frequent urination Yes    Mobility walk without assistance how many minutes can you walk? 30 ability to climb steps?  yes do you drive?  yes Do you have any goals in this area?  yes  Function employed # of hrs/week 12  Neuro/Psych bladder control problems tingling  Prior Studies Any changes since last visit?  no  Physicians involved in your care Any changes since last visit?  no   No family history on file. Social History   Socioeconomic History   Marital status: Single    Spouse name: Not on file   Number of children: Not on file   Years of education: Not on file   Highest education level: Not on file  Occupational History   Not on file  Tobacco Use   Smoking status: Never   Smokeless tobacco: Never  Vaping Use   Vaping Use: Never used  Substance and Sexual Activity   Alcohol use: Not Currently   Drug use: Never   Sexual activity: Not Currently   Other Topics Concern   Not on file  Social History Narrative   Not on file   Social Determinants of Health   Financial Resource Strain: Not on file  Food Insecurity: No Food Insecurity (11/09/2022)   Hunger Vital Sign    Worried About Running Out of Food in the Last Year: Never true    Ran Out of Food in the Last Year: Never true  Transportation Needs: No Transportation Needs (11/09/2022)   PRAPARE - Administrator, Civil Service (Medical): No    Lack of Transportation (Non-Medical): No  Physical Activity: Not on file  Stress: Not on file  Social Connections: Not on file   No past surgical history on file. Past Medical History:  Diagnosis Date   Hypercholesteremia    Stroke (HCC) 11/09/2022   BP 129/74   Pulse 67   Ht 5\' 2"  (1.575 m)   Wt 171 lb (77.6 kg)   SpO2 96%   BMI 31.28 kg/m   Opioid Risk Score:   Fall Risk Score:  `1  Depression screen [facility] 2/9     02/22/2023    1:11 PM 12/25/2022   10:01 AM  Depression screen PHQ 2/9  Decreased Interest 0 0  Down, Depressed, Hopeless 0 0  PHQ - 2 Score 0 0  Altered  sleeping  3  Tired, decreased energy  1  Change in appetite  0  Feeling bad or failure about yourself   0  Trouble concentrating  0  Moving slowly or fidgety/restless  0  Suicidal thoughts  0  PHQ-9 Score  4  Difficult doing work/chores  Not difficult at all      Review of Systems  Musculoskeletal:        Left pain in Hip   All other systems reviewed and are negative.      Objective:   Physical Exam  Gen: no distress, normal appearing, BMI 31.28 HEENT: oral mucosa pink and moist, NCAT Cardio: Reg rate Chest: normal effort, normal rate of breathing Abd: soft, non-distended Ext: no edema Psych: pleasant, normal affect Skin: intact Neuro: Alert and oriented x3 Psych: tearful      Assessment & Plan:   1) Insomnia: -recommended red light therapy -Try to go outside near sunrise -Get exercise during the day.  -Turn off all  devices an hour before bedtime.  -Teas that can benefit: chamomile, valerian root, Brahmi (Bacopa) -Can consider over the counter melatonin, magnesium, and/or L-theanine. Melatonin is an anti-oxidant with multiple health benefits. Magnesium is involved in greater than 300 enzymatic reactions in the body and most of [place] are deficient as our soil is often depleted. There are 7 different types of magnesium- Bioptemizer's is a supplement with all 7 types, and each has unique benefits. Magnesium can also help with constipation and anxiety.  -Pistachios naturally increase the production of melatonin -Cozy Earth bamboo bed sheets are free from toxic chemicals.  -Tart cherry juice or a tart cherry supplement can improve sleep and soreness post-workout  2) Muscle cramps: -increase magnesium gluconate to  HS  3) Urinary urgency: -discussed timed toileting   4) Daytime somnolence -continue CPAP, discussed that sleep apnea is a risk factor for stroke.   5) Lightheadedness: -reviewed Bps, blood sugars, discussed that maybe she is dehydrated in the morning -recommended to drink a glass of water in the morning.  -discussed signs and symptoms of hydrocephalus since she mentioned the wide based gait  6) Prediabetes, impaired fasting blood sugar -continue checking blood sugar -discussed continuous blood glucose monitoring -try to incorporate into your diet some of the following foods: 1) cinnamon- imitates effects of insulin, increasing glucose transport into cells ([place] or [place] cinnamon is best, least processed) 2) nuts- can slow down the blood sugar response of carbohydrate rich foods 3) oatmeal- contains and anti-inflammatory compound avenanthramide 4) whole-milk yogurt (best types are no sugar, [place] yogurt, or goat/sheep yogurt) 5) beans- high in protein, fiber, and vitamins, low glycemic index 6) broccoli- great source of vitamin A and C 7) quinoa- higher in protein and fiber than  other grains 8) spinach- high in vitamin A, fiber, and protein 9) olive oil- reduces glucose levels, LDL, and triglycerides 10) salmon- excellent amount of omega-3-fatty acids 11) walnuts- rich in antioxidants 12) apples- high in fiber and quercetin 13) carrots- highly nutritious with low impact on blood sugar 14) eggs- improve HDL (good cholesterol), high in protein, keep you satiated 15) turmeric: improves blood sugars, cardiovascular disease, and protects kidney health 16) garlic: improves blood sugar, blood pressure, pain 17) tomatoes: highly nutritious with low impact on blood sugar   7) Provided with list of supplements that can help with dyslipidemia: 1) Vitamin B3 500-4,000mg  in divided doses daily (would recommend starting low as can cause uncomfortable facial flushing if started at too high a dose)  2) Phytosterols 2.15 grams daily 3) Fermented soy 30-50 grams daily 4) EGCG (found in green tea): 500-1000mg  daily 5) Omega-3 fatty acids 3000-5,000mg  daily 6) Flax seed 40 grams daily 7) Monounsaturated fats 20-40 grams daily (olives, olive oil, nuts), also reduces cardiovascular disease 8) Sesame: 40 grams daily 9) Gamma/delta tocotrienols- a family of unsaturated forms of Vitamin E-  with dinner 10) Pantethine  daily in divided doses 11) Resveratrol  daily 12) N Acetyl Cysteine  daily in divided doses 13) Curcumin 2000-5000mg  in divided doses daily 14) Pomegranate juice: 8 ounces daily, also helps to lower blood pressure 15) Pomegranate seeds one cup daily, also helps to lower blood pressure 16) Citrus Bergamot  daily, also helps with glucose control and weight loss 17) Vitamin C  daily 18) Quercetin 500-1000mg  daily 19) Glutathione 20) Probiotics 60-100 billion organisms per day 21) Fiber 22) Oats 23) Aged garlic (can eat as food or supplement of 600-900mg  per day) 24) Chia seeds 25 grams per day 25) Lycopene- carotenoid found in high  concentrations in tomatoes. 26) Alpha linolenic acid 27) Flavonoids and anthocyanins 28) Wogonin- flavanoid that enhances reverse cholesterol transport 29) Coenzyme Q10 30) Pantethine- derivative of Vitamin B5:  three times per day or  twice per day with or without food 31) Barley and other whole grains 32) Orange juice 33) L- carnitine 34) L- Lysine 35) L- Arginine 36) Almonds 37) Morin 38) Rutin 39) Carnosine 40) Histidine  41) Kaempferol  42) Organosulfur compounds 43) Vitamin E 44) Oleic acid 45) RBO (ferulic acid gammaoryzanol) 46) grape seed extract 47) Red wine 48) Berberine HCL  daily or twice per day- more effective and with fewer adverse effects that ezetimibe monotherapy 49) red yeast rice 2400- 4800 mg/day 50) chlorella 51) Licorice   8) Fibromuscular dysplasia -discussed finding on her CTA

## 2023-02-23 LAB — COMPREHENSIVE METABOLIC PANEL
ALT: 38 IU/L — ABNORMAL HIGH (ref 0–32)
AST: 20 IU/L (ref 0–40)
Albumin/Globulin Ratio: 2.1 (ref 1.2–2.2)
Albumin: 4.6 g/dL (ref 3.8–4.9)
Alkaline Phosphatase: 98 IU/L (ref 44–121)
BUN/Creatinine Ratio: 14 (ref 12–28)
BUN: 11 mg/dL (ref 8–27)
Bilirubin Total: 0.4 mg/dL (ref 0.0–1.2)
CO2: 26 mmol/L (ref 20–29)
Calcium: 10 mg/dL (ref 8.7–10.3)
Chloride: 102 mmol/L (ref 96–106)
Creatinine, Ser: 0.76 mg/dL (ref 0.57–1.00)
Globulin, Total: 2.2 g/dL (ref 1.5–4.5)
Glucose: 95 mg/dL (ref 70–99)
Potassium: 5 mmol/L (ref 3.5–5.2)
Sodium: 142 mmol/L (ref 134–144)
Total Protein: 6.8 g/dL (ref 6.0–8.5)
eGFR: 90 mL/min/{1.73_m2} (ref >59)

## 2023-02-23 LAB — HEMOGLOBIN A1C
Est. average glucose Bld gHb Est-mCnc: 126 mg/dL
Hgb A1c MFr Bld: 6 % — ABNORMAL HIGH (ref 4.8–5.6)

## 2023-05-05 ENCOUNTER — Encounter: Payer: Self-pay | Admitting: Physical Medicine and Rehabilitation

## 2023-05-05 ENCOUNTER — Encounter: Payer: Self-pay | Admitting: Neurology

## 2023-05-25 ENCOUNTER — Encounter: Payer: 59 | Attending: Physical Medicine and Rehabilitation | Admitting: Physical Medicine and Rehabilitation

## 2023-05-25 VITALS — BP 112/72 | HR 61 | Ht 62.0 in | Wt 163.0 lb

## 2023-05-25 DIAGNOSIS — N3000 Acute cystitis without hematuria: Secondary | ICD-10-CM | POA: Insufficient documentation

## 2023-05-25 DIAGNOSIS — Z6829 Body mass index (BMI) 29.0-29.9, adult: Secondary | ICD-10-CM

## 2023-05-25 DIAGNOSIS — R252 Cramp and spasm: Secondary | ICD-10-CM | POA: Diagnosis not present

## 2023-05-25 DIAGNOSIS — R3915 Urgency of urination: Secondary | ICD-10-CM | POA: Insufficient documentation

## 2023-05-25 DIAGNOSIS — B379 Candidiasis, unspecified: Secondary | ICD-10-CM

## 2023-05-25 DIAGNOSIS — R5383 Other fatigue: Secondary | ICD-10-CM | POA: Diagnosis present

## 2023-05-25 DIAGNOSIS — Z8673 Personal history of transient ischemic attack (TIA), and cerebral infarction without residual deficits: Secondary | ICD-10-CM

## 2023-05-25 DIAGNOSIS — I639 Cerebral infarction, unspecified: Secondary | ICD-10-CM | POA: Insufficient documentation

## 2023-05-25 DIAGNOSIS — R7303 Prediabetes: Secondary | ICD-10-CM

## 2023-05-25 DIAGNOSIS — G4700 Insomnia, unspecified: Secondary | ICD-10-CM

## 2023-05-25 DIAGNOSIS — E663 Overweight: Secondary | ICD-10-CM

## 2023-05-25 NOTE — Progress Notes (Signed)
Subjective:    Patient ID: Joy Shelton, female    DOB: 20-Sep-1961, 62 y.o.   MRN: [id number]  HPI Joy Shelton is a 62 year old woman who presents for follow-up of CVA.  1) Insomnia -wakes at 4am -not getting enough sunlight exposure during the day  2) Lip numbness  3) Muscle cramps -present more at night  4) Urinary urgency:  -she wears Depends -she has about a minute after she feels the urge to urinate before she urinates -she is only incontinent when there is no bathroom near her -during the day she is going to the bathroom at a normal frequency  5) Daytime somnolence  6) Lightheaded and dizzy in the morning -this is usually when she looks sideways -she feels her eyes has to catch up to her head moving -denies room spinning -started in the last week -feels she has widened her walking stance  7) Elevated fasting blood sugar -ranging 95-111  8) Fatigue: -having difficulty keeping up with the pace of work -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 30 ability to climb steps?  yes do you drive?  yes Do you have any goals in this area?  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  no  Physicians involved in your care Any changes since last visit?  no   No family history on file. Social History   Socioeconomic History   Marital status: Single     Spouse name: Not on file   Number of children: Not on file   Years of education: Not on file   Highest education level: Not on file  Occupational History   Not on file  Tobacco Use   Smoking status: Never   Smokeless tobacco: Never  Vaping Use   Vaping status: Never Used  Substance and Sexual Activity   Alcohol use: Not Currently   Drug use: Never   Sexual activity: Not Currently  Other Topics Concern   Not on file  Social History Narrative   Not on file   Social Determinants of Health   Financial Resource Strain: Not on file  Food Insecurity: No Food Insecurity (11/09/2022)   Hunger Vital Sign    Worried About Running Out of Food in the Last Year: Never true    Ran Out of Food in the Last Year: Never true  Transportation Needs: No Transportation Needs (11/09/2022)   PRAPARE - Administrator, Civil Service (Medical): No    Lack of Transportation (Non-Medical): No  Physical Activity: Not on file  Stress: Not on file  Social Connections: Not on file   No past surgical history on file. Past Medical History:  Diagnosis Date   Hypercholesteremia    Stroke (HCC) 11/09/2022   BP 112/72   Pulse 61   Ht 5'  2" (1.575 m)   Wt 163 lb (73.9 kg)   SpO2 96%   BMI 29.81 kg/m   Opioid Risk Score:   Fall Risk Score:  `1  Depression screen [facility] 2/9     02/22/2023    1:11 PM 12/25/2022   10:01 AM  Depression screen PHQ 2/9  Decreased Interest 0 0  Down, Depressed, Hopeless 0 0  PHQ - 2 Score 0 0  Altered sleeping  3  Tired, decreased energy  1  Change in appetite  0  Feeling bad or failure about yourself   0  Trouble concentrating  0  Moving slowly or fidgety/restless  0  Suicidal thoughts  0  PHQ-9 Score  4  Difficult doing work/chores  Not difficult at all      Review of Systems  Musculoskeletal:        Left pain in Hip   All other systems reviewed and are negative.      Objective:   Physical Exam  Gen: no distress, normal appearing, BMI 29.81,  weight 163 lbs HEENT: oral mucosa pink and moist, NCAT Cardio: Reg rate Chest: normal effort, normal rate of breathing Abd: soft, non-distended Ext: no edema Psych: pleasant, normal affect Skin: intact Neuro: Alert and oriented x3      Assessment & Plan:   1) Insomnia: -recommended red light therapy -Try to go outside near sunrise -Get exercise during the day.  -Turn off all devices an hour before bedtime.  -Teas that can benefit: chamomile, valerian root, Brahmi (Bacopa) -Can consider over the counter melatonin, magnesium, and/or L-theanine. Melatonin is an anti-oxidant with multiple health benefits. Magnesium is involved in greater than 300 enzymatic reactions in the body and most of [place] are deficient as our soil is often depleted. There are 7 different types of magnesium- Bioptemizer's is a supplement with all 7 types, and each has unique benefits. Magnesium can also help with constipation and anxiety.  -Pistachios naturally increase the production of melatonin -Cozy Earth bamboo bed sheets are free from toxic chemicals.  -Tart cherry juice or a tart cherry supplement can improve sleep and soreness post-workout  2) Muscle cramps: -increase magnesium gluconate to 500mg  HS  3) Urinary urgency: -discussed timed toileting   4) Daytime somnolence -continue CPAP, discussed that sleep apnea is a risk factor for stroke.   5) Lightheadedness: -reviewed Bps, blood sugars, discussed that maybe she is dehydrated in the morning -recommended to drink a glass of water in the morning.  -discussed signs and symptoms of hydrocephalus since she mentioned the wide based gait  6) Prediabetes, impaired fasting blood sugar -continue checking blood sugar -discussed continuous blood glucose monitoring -try to incorporate into your diet some of the following foods: 1) cinnamon- imitates effects of insulin, increasing glucose transport into cells ([place] or [place] cinnamon is best, least  processed) 2) nuts- can slow down the blood sugar response of carbohydrate rich foods 3) oatmeal- contains and anti-inflammatory compound avenanthramide 4) whole-milk yogurt (best types are no sugar, [place] yogurt, or goat/sheep yogurt) 5) beans- high in protein, fiber, and vitamins, low glycemic index 6) broccoli- great source of vitamin A and C 7) quinoa- higher in protein and fiber than other grains 8) spinach- high in vitamin A, fiber, and protein 9) olive oil- reduces glucose levels, LDL, and triglycerides 10) salmon- excellent amount of omega-3-fatty acids 11) walnuts- rich in antioxidants 12) apples- high in fiber and quercetin 13) carrots- highly nutritious with low impact on blood sugar 14) eggs-  improve HDL (good cholesterol), high in protein, keep you satiated 15) turmeric: improves blood sugars, cardiovascular disease, and protects kidney health 16) garlic: improves blood sugar, blood pressure, pain 17) tomatoes: highly nutritious with low impact on blood sugar   7) Provided with list of supplements that can help with dyslipidemia: 1) Vitamin B3 500-4,000mg  in divided doses daily (would recommend starting low as can cause uncomfortable facial flushing if started at too high a dose) 2) Phytosterols 2.15 grams daily 3) Fermented soy 30-50 grams daily 4) EGCG (found in green tea): 500-1000mg  daily 5) Omega-3 fatty acids 3000-5,000mg  daily 6) Flax seed 40 grams daily 7) Monounsaturated fats 20-40 grams daily (olives, olive oil, nuts), also reduces cardiovascular disease 8) Sesame: 40 grams daily 9) Gamma/delta tocotrienols- a family of unsaturated forms of Vitamin E- 200mg  with dinner 10) Pantethine 900mg  daily in divided doses 11) Resveratrol 250mg  daily 12) N Acetyl Cysteine 2000mg  daily in divided doses 13) Curcumin 2000-5000mg  in divided doses daily 14) Pomegranate juice: 8 ounces daily, also helps to lower blood pressure 15) Pomegranate seeds one cup daily, also helps to  lower blood pressure 16) Citrus Bergamot 1000mg  daily, also helps with glucose control and weight loss 17) Vitamin C 500mg  daily 18) Quercetin 500-1000mg  daily 19) Glutathione 20) Probiotics 60-100 billion organisms per day 21) Fiber 22) Oats 23) Aged garlic (can eat as food or supplement of 600-900mg  per day) 24) Chia seeds 25 grams per day 25) Lycopene- carotenoid found in high concentrations in tomatoes. 26) Alpha linolenic acid 27) Flavonoids and anthocyanins 28) Wogonin- flavanoid that enhances reverse cholesterol transport 29) Coenzyme Q10 30) Pantethine- derivative of Vitamin B5: 300mg  three times per day or 450mg  twice per day with or without food 31) Barley and other whole grains 32) Orange juice 33) L- carnitine 34) L- Lysine 35) L- Arginine 36) Almonds 37) Morin 38) Rutin 39) Carnosine 40) Histidine  41) Kaempferol  42) Organosulfur compounds 43) Vitamin E 44) Oleic acid 45) RBO (ferulic acid gammaoryzanol) 46) grape seed extract 47) Red wine 48) Berberine HCL 500mg  daily or twice per day- more effective and with fewer adverse effects that ezetimibe monotherapy 49) red yeast rice 2400- 4800 mg/day 50) chlorella 51) Licorice   8) Fibromuscular dysplasia -discussed finding on her CTA  9) Overweight: -commended on weight loss -discussed that fatigue affects her ability to exercise -discussed her positive dietary changes  10) CVA: -completed disability paperwork for her -continue aspirin and statin  11) Fatigue: -B12 ordered -discussed that this impacts her ability to exercise  13) Yeast infection: -UA/UC ordered

## 2023-05-26 ENCOUNTER — Encounter: Payer: 59 | Admitting: Physical Medicine and Rehabilitation

## 2023-05-26 DIAGNOSIS — N3000 Acute cystitis without hematuria: Secondary | ICD-10-CM | POA: Diagnosis not present

## 2023-05-26 DIAGNOSIS — R3915 Urgency of urination: Secondary | ICD-10-CM

## 2023-05-26 LAB — B12 AND FOLATE PANEL
Folate: 7.8 ng/mL (ref >3.0)
Vitamin B-12: 254 pg/mL (ref 232–1245)

## 2023-05-26 LAB — URINALYSIS, ROUTINE W REFLEX MICROSCOPIC
Bilirubin, UA: NEGATIVE
Glucose, UA: NEGATIVE
Nitrite, UA: NEGATIVE
RBC, UA: NEGATIVE
Specific Gravity, UA: 1.026 (ref 1.005–1.030)
Urobilinogen, Ur: 1 mg/dL (ref 0.2–1.0)
pH, UA: 6.5 (ref 5.0–7.5)

## 2023-05-26 LAB — MICROSCOPIC EXAMINATION
Casts: NONE SEEN /lpf
RBC, Urine: NONE SEEN /hpf (ref 0–2)

## 2023-05-26 MED ORDER — NEEDLES & SYRINGES MISC: 1.0000 | 3 refills | Status: DC

## 2023-05-26 MED ORDER — CYANOCOBALAMIN 1000 MCG/ML IJ SOLN: 1000.0000 ug | Freq: Once | INTRAMUSCULAR | 0 refills | Status: AC

## 2023-05-26 NOTE — Addendum Note (Signed)
Addended by: Horton Chin on: 05/26/2023 03:34 PM   Modules accepted: Orders

## 2023-05-26 NOTE — Progress Notes (Addendum)
Subjective:    Patient ID: Joy Shelton, female    DOB: 09/25/1961, 62 y.o.   MRN: [id number]  HPI Joy Shelton is a 62 year old woman who presents for follow-up of CVA.  1) Insomnia -wakes at 4am -not getting enough sunlight exposure during the day  2) Lip numbness  3) Muscle cramps -present more at night  4) Urinary urgency:  -she wears Depends -she has about a minute after she feels the urge to urinate before she urinates -she is only incontinent when there is no bathroom near her -during the day she is going to the bathroom at a normal frequency  5) Daytime somnolence  6) Lightheaded and dizzy in the morning -this is usually when she looks sideways -she feels her eyes has to catch up to her head moving -denies room spinning -started in the last week -feels she has widened her walking stance  7) Elevated fasting blood sugar -ranging 95-111  8) Fatigue: -having difficulty keeping up with the pace of work -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 30 ability to climb steps?  yes do you drive?  yes Do you have any goals in this area?  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  no  Physicians involved in your care Any changes since last visit?  no   No family history on file. Social History    Socioeconomic History   Marital status: Single    Spouse name: Not on file   Number of children: Not on file   Years of education: Not on file   Highest education level: Not on file  Occupational History   Not on file  Tobacco Use   Smoking status: Never   Smokeless tobacco: Never  Vaping Use   Vaping status: Never Used  Substance and Sexual Activity   Alcohol use: Not Currently   Drug use: Never   Sexual activity: Not Currently  Other Topics Concern   Not on file  Social History Narrative   Not on file   Social Determinants of Health   Financial Resource Strain: Not on file  Food Insecurity: No Food Insecurity (11/09/2022)   Hunger Vital Sign    Worried About Running Out of Food in the Last Year: Never true    Ran Out of Food in the Last Year: Never true  Transportation Needs: No Transportation Needs (11/09/2022)   PRAPARE - Administrator, Civil Service (Medical): No    Lack of Transportation (Non-Medical): No  Physical Activity: Not on file  Stress: Not on file  Social Connections: Not on file   No past surgical history on file. Past Medical History:  Diagnosis Date   Hypercholesteremia    Stroke (HCC) 11/09/2022   There were no  vitals taken for this visit.  Opioid Risk Score:   Fall Risk Score:  `1  Depression screen [facility] 2/9     02/22/2023    1:11 PM 12/25/2022   10:01 AM  Depression screen PHQ 2/9  Decreased Interest 0 0  Down, Depressed, Hopeless 0 0  PHQ - 2 Score 0 0  Altered sleeping  3  Tired, decreased energy  1  Change in appetite  0  Feeling bad or failure about yourself   0  Trouble concentrating  0  Moving slowly or fidgety/restless  0  Suicidal thoughts  0  PHQ-9 Score  4  Difficult doing work/chores  Not difficult at all      Review of Systems  Musculoskeletal:        Left pain in Hip   All other systems reviewed and are negative.      Objective:   Physical Exam  Gen: no distress, normal appearing, BMI 29.81,  weight 163 lbs HEENT: oral mucosa pink and moist, NCAT Cardio: Reg rate Chest: normal effort, normal rate of breathing Abd: soft, non-distended Ext: no edema Psych: pleasant, normal affect Skin: intact Neuro: Alert and oriented x3      Assessment & Plan:   1) Insomnia: -recommended red light therapy -Try to go outside near sunrise -Get exercise during the day.  -Turn off all devices an hour before bedtime.  -Teas that can benefit: chamomile, valerian root, Brahmi (Bacopa) -Can consider over the counter melatonin, magnesium, and/or L-theanine. Melatonin is an anti-oxidant with multiple health benefits. Magnesium is involved in greater than 300 enzymatic reactions in the body and most of [place] are deficient as our soil is often depleted. There are 7 different types of magnesium- Bioptemizer's is a supplement with all 7 types, and each has unique benefits. Magnesium can also help with constipation and anxiety.  -Pistachios naturally increase the production of melatonin -Cozy Earth bamboo bed sheets are free from toxic chemicals.  -Tart cherry juice or a tart cherry supplement can improve sleep and soreness post-workout  2) Muscle cramps: -increase magnesium gluconate to 500mg  HS  3) Urinary urgency: -discussed timed toileting   4) Daytime somnolence -continue CPAP, discussed that sleep apnea is a risk factor for stroke.   5) Lightheadedness: -reviewed Bps, blood sugars, discussed that maybe she is dehydrated in the morning -recommended to drink a glass of water in the morning.  -discussed signs and symptoms of hydrocephalus since she mentioned the wide based gait  6) Prediabetes, impaired fasting blood sugar -continue checking blood sugar -discussed continuous blood glucose monitoring -try to incorporate into your diet some of the following foods: 1) cinnamon- imitates effects of insulin, increasing glucose transport into cells ([place] or [place] cinnamon is best, least  processed) 2) nuts- can slow down the blood sugar response of carbohydrate rich foods 3) oatmeal- contains and anti-inflammatory compound avenanthramide 4) whole-milk yogurt (best types are no sugar, [place] yogurt, or goat/sheep yogurt) 5) beans- high in protein, fiber, and vitamins, low glycemic index 6) broccoli- great source of vitamin A and C 7) quinoa- higher in protein and fiber than other grains 8) spinach- high in vitamin A, fiber, and protein 9) olive oil- reduces glucose levels, LDL, and triglycerides 10) salmon- excellent amount of omega-3-fatty acids 11) walnuts- rich in antioxidants 12) apples- high in fiber and quercetin 13) carrots- highly nutritious with low impact on blood sugar 14) eggs- improve HDL (good cholesterol), high in protein, keep you satiated 15) turmeric: improves blood sugars,  cardiovascular disease, and protects kidney health 16) garlic: improves blood sugar, blood pressure, pain 17) tomatoes: highly nutritious with low impact on blood sugar   7) Provided with list of supplements that can help with dyslipidemia: 1) Vitamin B3 500-4,000mg  in divided doses daily (would recommend starting low as can cause uncomfortable facial flushing if started at too high a dose) 2) Phytosterols 2.15 grams daily 3) Fermented soy 30-50 grams daily 4) EGCG (found in green tea): 500-1000mg  daily 5) Omega-3 fatty acids 3000-5,000mg  daily 6) Flax seed 40 grams daily 7) Monounsaturated fats 20-40 grams daily (olives, olive oil, nuts), also reduces cardiovascular disease 8) Sesame: 40 grams daily 9) Gamma/delta tocotrienols- a family of unsaturated forms of Vitamin E- 200mg  with dinner 10) Pantethine 900mg  daily in divided doses 11) Resveratrol 250mg  daily 12) N Acetyl Cysteine 2000mg  daily in divided doses 13) Curcumin 2000-5000mg  in divided doses daily 14) Pomegranate juice: 8 ounces daily, also helps to lower blood pressure 15) Pomegranate seeds one cup daily, also helps to  lower blood pressure 16) Citrus Bergamot 1000mg  daily, also helps with glucose control and weight loss 17) Vitamin C 500mg  daily 18) Quercetin 500-1000mg  daily 19) Glutathione 20) Probiotics 60-100 billion organisms per day 21) Fiber 22) Oats 23) Aged garlic (can eat as food or supplement of 600-900mg  per day) 24) Chia seeds 25 grams per day 25) Lycopene- carotenoid found in high concentrations in tomatoes. 26) Alpha linolenic acid 27) Flavonoids and anthocyanins 28) Wogonin- flavanoid that enhances reverse cholesterol transport 29) Coenzyme Q10 30) Pantethine- derivative of Vitamin B5: 300mg  three times per day or 450mg  twice per day with or without food 31) Barley and other whole grains 32) Orange juice 33) L- carnitine 34) L- Lysine 35) L- Arginine 36) Almonds 37) Morin 38) Rutin 39) Carnosine 40) Histidine  41) Kaempferol  42) Organosulfur compounds 43) Vitamin E 44) Oleic acid 45) RBO (ferulic acid gammaoryzanol) 46) grape seed extract 47) Red wine 48) Berberine HCL 500mg  daily or twice per day- more effective and with fewer adverse effects that ezetimibe monotherapy 49) red yeast rice 2400- 4800 mg/day 50) chlorella 51) Licorice   8) Fibromuscular dysplasia -discussed finding on her CTA  9) Overweight: -commended on weight loss -discussed that fatigue affects her ability to exercise -discussed her positive dietary changes  10) CVA: -completed disability paperwork for her -continue aspirin and statin  11) Fatigue: -B12 ordered, discussed that her level is at the low end of the normal range, prescribed B12 injection once per month -discussed that this impacts her ability to exercise  13) Yeast infection: -UA/UC ordered  5 minutes spent discussing that B12 is at the low end of the normal range, discussed B12 injections and this is more absorbable than oral B12

## 2023-05-27 LAB — URINE CULTURE

## 2023-05-30 ENCOUNTER — Encounter: Payer: Self-pay | Admitting: Physical Medicine and Rehabilitation

## 2023-06-16 ENCOUNTER — Encounter: Payer: Self-pay | Admitting: Neurology

## 2023-06-30 NOTE — Progress Notes (Unsigned)
[facility] [address] [place]. [place] [address] [phone]       OFFICE FOLLOW-UP NOTE  [name] Date of Birth:  [birth year] Medical Record Number:  [id number]    Primary neurologist: Dr. Pearlean Brownie Reason for visit: Stroke follow-up   No chief complaint on file.      HPI:    Update 07/01/2023 JM: Returns for stroke follow-up visit.  Has been stable without new stroke/TIA symptoms.  Residual balance ***?   Compliant on aspirin and Crestor without side effects Routinely follows with PCP for stroke risk factor management  Sleep smart study ***        Initial visit 01/29/2023 Dr. Pearlean Brownie: [name] is a pleasant 62 year old Caucasian lady seen today for initial office follow-up visit following hospital consideration for stroke disease.  History is obtained from the patient and review of electronic medical records.  I personally reviewed pertinent available imaging films in PACS.  She has past medical history of hyperlipidemia and difficulty swallowing and presented on 04/08/2022 to [facility] complaining of headache and tingling in the left and fingers..  Her symptoms started with burning sensation in her eyes and a mild headache that lasted a few minutes on.  This returned later on in the and more tingling in her face as well as hands.  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  CT angiogram of the head and neck showed subtle vessel wall irregularity in the mid cervical ICAs jesting possible fibromuscular dysplasia but no large vessel stenosis or lesion.  Minimal calcification of carotid palpitations.  2D echo showed ejection fraction of 55%.  LDL cholesterol was elevated at 139 mg. She was started on dual antiplatelet therapy and transfer to inpatient rehab and she has done well at home.  She feels she is almost back to baseline.  He occasionally feels lightheaded and off-balance he  is tired of quickly and needs a further movement.  He has returned to work part-time is mostly working from home with very poor for back to work full-time no.  He is tolerating aspirin well without bruising or bleeding.  Crestor 40 mg daily and having lipid profile checked recently on 14/24 which showed LDL cholesterol to be optimal at 68 mg%.  Her blood pressure is under good control and today it is 121/70.  Patient participated in the sleep smart stroke prevention study and tested positive for obstructive sleep apnea and was randomized to the CPAP treatment.  She states she is been quite compliant with using the CPAP.  He has been eating healthy and active and has lost some weight.  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  Is not affected.  She has been evaluated by ENT in the past plans on having colonoscopy soon I discussed periprocedural need for holding aspirin slight but acceptable risk of stroke/TIA if she is willing.  She denies any prior history of strokes TIA seizures or significant neurological problems.    ROS:   14 system review of systems is positive for tinnitus, lightheadedness, dizziness, imbalance and all other systems negative  PMH:  Past Medical History:  Diagnosis Date   Hypercholesteremia    Stroke (HCC) 11/09/2022    Social History:  Social History   Socioeconomic History   Marital status: Single    Spouse name: Not on file   Number of children: Not  on file   Years of education: Not on file   Highest education level: Not on file  Occupational History   Not on file  Tobacco Use   Smoking status: Never   Smokeless tobacco: Never  Vaping Use   Vaping status: Never Used  Substance and Sexual Activity   Alcohol use: Not Currently   Drug use: Never   Sexual activity: Not Currently  Other Topics Concern   Not on file  Social History Narrative   Not on file   Social Determinants of Health   Financial Resource  Strain: Not on file  Food Insecurity: No Food Insecurity (11/09/2022)   Hunger Vital Sign    Worried About Running Out of Food in the Last Year: Never true    Ran Out of Food in the Last Year: Never true  Transportation Needs: No Transportation Needs (11/09/2022)   PRAPARE - Administrator, Civil Service (Medical): No    Lack of Transportation (Non-Medical): No  Physical Activity: Not on file  Stress: Not on file  Social Connections: Not on file  Intimate Partner Violence: Not At Risk (11/09/2022)   Humiliation, Afraid, Rape, and Kick questionnaire    Fear of Current or Ex-Partner: No    Emotionally Abused: No    Physically Abused: No    Sexually Abused: No    Medications:   Current Outpatient Medications on File Prior to Visit  Medication Sig Dispense Refill   acetaminophen (TYLENOL) 325 MG tablet Take 1-2 tablets (325-650 mg total) by mouth every 4 (four) hours as needed for mild pain.     aspirin EC 81 MG tablet Take 1 tablet (81 mg total) by mouth daily. Swallow whole. 30 tablet 12   magnesium gluconate (MAGONATE) 500 MG tablet Take 0.5 tablets (250 mg total) by mouth at bedtime. 15 tablet 0   Needles & Syringes MISC 1 each by Does not apply route every 30 (thirty) days. 1 each 3   rosuvastatin (CRESTOR) 40 MG tablet Take 1 tablet (40 mg total) by mouth daily. 30 tablet 0   Vitamin D, Ergocalciferol, (DRISDOL) 1.25 MG (50000 UNIT) CAPS capsule TAKE 1 CAPSULE (50,000 UNITS TOTAL) BY MOUTH EVERY 7 (SEVEN) DAYS. EACH WEDNESDAY. 12 capsule 1   No current facility-administered medications on file prior to visit.    Allergies:   Allergies  Allergen Reactions   Lipitor [Atorvastatin]     Muscle aches    Physical Exam There were no vitals filed for this visit. There is no height or weight on file to calculate BMI.   General: Mildly obese pleasant middle-age Caucasian lady, seated, in no evident distress Head: head normocephalic and atraumatic.  Neck: supple with no  carotid or supraclavicular bruits Cardiovascular: regular rate and rhythm, no murmurs Musculoskeletal: no deformity Skin:  no rash/petichiae Vascular:  Normal pulses all extremities  Neurologic Exam Mental Status: Awake and fully alert. Oriented to place and time. Recent and remote memory intact. Attention span, concentration and fund of knowledge appropriate. Mood and affect appropriate.  Cranial Nerves: Pupils equal, briskly reactive to light. Extraocular movements full without nystagmus. Visual fields full to confrontation. Hearing intact. Facial sensation intact. Face, tongue, palate moves normally and symmetrically.  Motor: Normal bulk and tone. Normal strength in all tested extremity muscles. Sensory.: intact to touch ,pinprick .position and vibratory sensation.  Coordination: Rapid alternating movements normal in all extremities. Finger-to-nose and heel-to-shin performed accurately bilaterally. Gait and Station: Arises from chair without difficulty. Stance is normal.  Gait demonstrates normal stride length and balance . Able to heel, toe and tandem walk without difficulty.  Reflexes: 1+ and symmetric. Toes downgoing.        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  Vascular risk factors of hyperlipidemia, obstructive sleep apnea.  Patient is participating in the sleep smart stroke prevention study CPAP treatment     PLAN:I had a long d/w patient about her recent Pontine lacunar strokes, risk for recurrent stroke/TIAs, personally independently reviewed imaging studies and stroke evaluation results and answered questions.Continue aspirin 81 mg daily  for secondary stroke prevention and maintain strict control of hypertension with blood pressure goal below 130/90, diabetes with hemoglobin A1c goal below 6.5% and lipids with LDL cholesterol goal below 70 mg/dL. I also advised the patient to eat a healthy diet  with plenty of whole grains, cereals, fruits and vegetables, exercise regularly and maintain ideal body weight .she was also advised to be compliant with using her CPAP for her sleep apnea treatment.  Continue participation in the sleep smart stroke prevention study.       I spent *** minutes of face-to-face and non-face-to-face time with patient.  This included previsit chart review, lab review, study review, order entry, electronic health record documentation, patient education and discussion regarding above diagnoses and treatment plan and answered all other questions to patient's satisfaction  Ihor Austin, [facility]  [facility] [address] [place], [place] [address]  Phone [phone] Fax [phone] Note: This document was prepared with digital dictation and possible smart phrase technology. Any transcriptional errors that result from this process are unintentional.

## 2023-07-01 ENCOUNTER — Ambulatory Visit (INDEPENDENT_AMBULATORY_CARE_PROVIDER_SITE_OTHER): Payer: 59 | Admitting: Adult Health

## 2023-07-01 ENCOUNTER — Encounter: Payer: Self-pay | Admitting: Adult Health

## 2023-07-01 VITALS — BP 124/71 | HR 70 | Ht 62.0 in | Wt 162.0 lb

## 2023-07-01 DIAGNOSIS — I6350 Cerebral infarction due to unspecified occlusion or stenosis of unspecified cerebral artery: Secondary | ICD-10-CM | POA: Diagnosis not present

## 2023-07-01 DIAGNOSIS — G4733 Obstructive sleep apnea (adult) (pediatric): Secondary | ICD-10-CM | POA: Diagnosis not present

## 2023-07-01 DIAGNOSIS — R42 Dizziness and giddiness: Secondary | ICD-10-CM

## 2023-07-01 NOTE — Patient Instructions (Addendum)
Referral placed to physical therapy for vestibular therapy - you will be called to schedule (sent to [facility])  Continue B12 injections for B12 deficiency  Restart use of CPAP - will follow up with our sleep clinic regarding ongoing management of CPAP needs   Continue aspirin 81 mg daily  and Crestor  for secondary stroke prevention  Continue to follow up with PCP regarding cholesterol management  Maintain strict control of cholesterol with LDL cholesterol (bad cholesterol) goal below 70 mg/dL.   Signs of a Stroke? Follow the BEFAST method:  Balance Watch for a sudden loss of balance, trouble with coordination or vertigo Eyes Is there a sudden loss of vision in one or both eyes? Or double vision?  Face: Ask the person to smile. Does one side of the face droop or is it numb?  Arms: Ask the person to raise both arms. Does one arm drift downward? Is there weakness or numbness of a leg? Speech: Ask the person to repeat a simple phrase. Does the speech sound slurred/strange? Is the person confused ? Time: If you observe any of these signs, call 911.      Followup in the future with me in 4 months or call earlier if needed       Thank you for coming to see [place] at [facility]. I hope we have been able to provide you high quality care today.  You may receive a patient satisfaction survey over the next few weeks. We would appreciate your feedback and comments so that we may continue to improve ourselves and the health of our patients.

## 2023-07-20 ENCOUNTER — Ambulatory Visit: Payer: 59 | Attending: Adult Health

## 2023-07-20 ENCOUNTER — Other Ambulatory Visit: Payer: Self-pay

## 2023-07-20 DIAGNOSIS — H8111 Benign paroxysmal vertigo, right ear: Secondary | ICD-10-CM | POA: Diagnosis present

## 2023-07-20 DIAGNOSIS — R42 Dizziness and giddiness: Secondary | ICD-10-CM | POA: Insufficient documentation

## 2023-07-20 DIAGNOSIS — I6350 Cerebral infarction due to unspecified occlusion or stenosis of unspecified cerebral artery: Secondary | ICD-10-CM | POA: Diagnosis not present

## 2023-07-20 NOTE — Therapy (Signed)
OUTPATIENT PHYSICAL THERAPY VESTIBULAR EVALUATION     Patient Name: Joy Shelton MRN: [id number] DOB:December 29, 1960, 62 y.o., female Today's Date: 07/20/2023  END OF SESSION:  PT End of Session - 07/20/23 1416     Visit Number 1    Date for PT Re-Evaluation 08/31/23    PT Start Time 1310    PT Stop Time 1400    PT Time Calculation (min) 50 min    Activity Tolerance Patient tolerated treatment well    Behavior During Therapy [facility] for tasks assessed/performed             Past Medical History:  Diagnosis Date   Hypercholesteremia    Stroke (HCC) 11/09/2022   History reviewed. No pertinent surgical history. Patient Active Problem List   Diagnosis Date Noted   History of CVA (cerebrovascular accident) 12/31/2022   Sleep apnea 12/31/2022   Lacunar infarct, acute (HCC) 11/13/2022   CVA (cerebral vascular accident) (HCC) 11/09/2022   Hyperlipidemia 11/09/2022   Obesity (BMI 30-39.9) 11/09/2022   Sensorineural hearing loss (SNHL), bilateral 06/10/2021   Tinnitus of both ears 06/10/2021    PCP: Laurann Montana, MD REFERRING PROVIDER: Ihor Austin, NP  REFERRING DIAG: vertigo  THERAPY DIAG:  BPPV (benign paroxysmal positional vertigo), right  Dizziness and giddiness  ONSET DATE: 2 yrs, 2022  Rationale for Evaluation and Treatment: Rehabilitation  SUBJECTIVE:   SUBJECTIVE STATEMENT: I notice the dizziness, it will last briefly, usually with rolling over in bed, once recently when looking up to fix my blinds.  Usually will occur like that for a few days in a row, then it will stop for a while, maybe a week and then occur again. Pt accompanied by: self  PERTINENT HISTORY: CVA, pontine 2 yrs ago.  Experiencing vertigo since that time when rolling over in bed.  Referred by neurologist for PT eval to assess for BPPV or more due to vestibular changes   PAIN:  Are you having pain? No  PRECAUTIONS: None  RED FLAGS: None   WEIGHT BEARING RESTRICTIONS: No  FALLS:  Has patient fallen in last 6 months? No  LIVING ENVIRONMENT: Lives with: lives alone Lives in: House/apartment Stairs: No Has following equipment at home: None  PLOF: Independent  PATIENT GOALS: get rid of dizziness  OBJECTIVE:   DIAGNOSTIC FINDINGS: CT in February without abnormalities  COGNITION: Overall cognitive status: Within functional limits for tasks assessed   SENSATION: WFL  EDEMA:  na  MUSCLE TONE: wnl  POSTURE:  forward head and increased thoracic kyphosis  Cervical ROM:    Active A/PROM (deg) eval  Flexion wfl  Extension wfl  Right lateral flexion wfl  Left lateral flexion wfl  Right rotation wfl  Left rotation wfl  (Blank rows = not tested)  STRENGTH: wfl B UE    BED MOBILITY:  Sit to supine Complete Independence Supine to sit Complete Independence Rolling to Right Complete Independence Rolling to Left Complete Independence  TRANSFERS: Wnl, I   GAIT:wnl, no deficits noted  FUNCTIONAL TESTS:  Dynamic Gait Index: TBD  PATIENT SURVEYS:  DHI 16  VESTIBULAR ASSESSMENT:  GENERAL OBSERVATION: pleasant, healthy female, moves head easily while talking, no issues   SYMPTOM BEHAVIOR:  Subjective history: intermittent periods of brief vertigo noted primarily with bed mobility, also with bending down and recently looking up to fix her blinds.    Non-Vestibular symptoms: headaches and tinnitus  Type of dizziness: Spinning/Vertigo and Unsteady with head/body turns  Frequency: daily, often for one week, then goes away  for one to 2 weeks  Duration: a few seconds   Aggravating factors: Induced by position change: lying supine, rolling to the right, rolling to the left, and supine to sit and Induced by motion: looking up at the ceiling, bending down to the ground, and turning head quickly  Relieving factors: head stationary  Progression of symptoms: unchanged  OCULOMOTOR EXAM:  Ocular Alignment: abnormal  Ocular ROM: No  Limitations  Spontaneous Nystagmus: absent  Gaze-Induced Nystagmus: right beating with right gaze  Smooth Pursuits: intact  Saccades: intact  Convergence/Divergence: 4 cm    VESTIBULAR - OCULAR REFLEX:   Slow VOR: Normal  VOR Cancellation: Normal  Head-Impulse Test: HIT Right: negative HIT Left: negative  Dynamic Visual Acuity: Not able to be assessed   POSITIONAL TESTING: Right Dix-Hallpike: downbeating, right nystagmus Left Dix-Hallpike: no nystagmus Right Roll Test: no nystagmus Left Roll Test: no nystagmus  MOTION SENSITIVITY: NA today  Motion Sensitivity Quotient Intensity: 0 = none, 1 = Lightheaded, 2 = Mild, 3 = Moderate, 4 = Severe, 5 = Vomiting  Intensity  1. Sitting to supine   2. Supine to L side   3. Supine to R side   4. Supine to sitting   5. L Hallpike-Dix   6. Up from L    7. R Hallpike-Dix   8. Up from R    9. Sitting, head tipped to L knee   10. Head up from L knee   11. Sitting, head tipped to R knee   12. Head up from R knee   13. Sitting head turns x5   14.Sitting head nods x5   15. In stance, 180 turn to L    16. In stance, 180 turn to R     OTHOSTATICS: not done  FUNCTIONAL GAIT: NA today   VESTIBULAR TREATMENT:                                                                                                   DATE: 07/20/23  Canalith Repositioning:  Epley Right: Number of Reps: 2 x, on 3rd reassessment no dizziness or nystagmus noted  PATIENT EDUCATION: Education details: POC, goals Person educated: Patient Education method: Explanation, Demonstration, and Tactile cues Education comprehension: verbalized understanding  HOME EXERCISE PROGRAM:  GOALS: Goals reviewed with patient? Yes  SHORT TERM GOALS: Target date: 2 weeks, 01/02/23  I home program as needed Baseline: Goal status: INITIAL   LONG TERM GOALS: Target date: 08/31/23  Clear testing , Sx free R posterior canalithiasis testing Baseline: + R post canal testing Goal  status: INITIAL  2.  DGI 23/24 Baseline: TBD Goal status: INITIAL  3.  DHI 5% or less  Baseline: 15% Goal status: INITIAL   ASSESSMENT:  CLINICAL IMPRESSION: Patient is a 62 y.o. female who was seen today for physical therapy evaluation and treatment for dizziness, vertigo. Her Sx have occurred for 2 years, correlating with the time in which she suffered a pontine CVA. Today she had brief nystagmus, geotrophic , with initial R posterior canal testing, therefore performed Epley x 2 with resolution of Sx  with 3rd test position.  She had no dizziness or balance deficits with standing and exiting the clinic.  Tolerated everything well. She should benefit from skilled PT to address her BPPV.  She needs additional balance testing on next visit if appropriate  OBJECTIVE IMPAIRMENTS: decreased activity tolerance and dizziness.   ACTIVITY LIMITATIONS: bending and bed mobility  PARTICIPATION LIMITATIONS: cleaning and community activity  PERSONAL FACTORS: Behavior pattern, Past/current experiences, Time since onset of injury/illness/exacerbation, and 1 comorbidity: h/o CVA 2 yrs ago, HTN  are also affecting patient's functional outcome.   REHAB POTENTIAL: Good  CLINICAL DECISION MAKING: Stable/uncomplicated  EVALUATION COMPLEXITY: Low   PLAN:  PT FREQUENCY: 1x/week  PT DURATION: 6 weeks  PLANNED INTERVENTIONS: Therapeutic exercises, Therapeutic activity, Neuromuscular re-education, Balance training, Gait training, Patient/Family education, Self Care, Vestibular training, and Canalith repositioning  PLAN FOR NEXT SESSION: reassess for BPPV, perform DGI if appropriate, and additional VOR testing, instruct in home program as appropriate   Seynabou Fults L Loring Liskey, PT, DPT, OCS 07/20/2023, 2:27 PM

## 2023-07-22 ENCOUNTER — Encounter: Payer: Self-pay | Admitting: Adult Health

## 2023-07-27 ENCOUNTER — Other Ambulatory Visit: Payer: Self-pay

## 2023-07-27 ENCOUNTER — Encounter: Payer: Self-pay | Admitting: Physical Medicine and Rehabilitation

## 2023-07-27 ENCOUNTER — Ambulatory Visit: Payer: 59

## 2023-07-27 DIAGNOSIS — R42 Dizziness and giddiness: Secondary | ICD-10-CM

## 2023-07-27 DIAGNOSIS — H8111 Benign paroxysmal vertigo, right ear: Secondary | ICD-10-CM

## 2023-07-27 NOTE — Therapy (Signed)
OUTPATIENT PHYSICAL THERAPY VESTIBULAR EVALUATION     Patient Name: Joy Shelton MRN: [id number] DOB:February 02, 1961, 62 y.o., female Today's Date: 07/27/2023  END OF SESSION:    Past Medical History:  Diagnosis Date   Hypercholesteremia    Stroke (HCC) 11/09/2022   No past surgical history on file. Patient Active Problem List   Diagnosis Date Noted   History of CVA (cerebrovascular accident) 12/31/2022   Sleep apnea 12/31/2022   Lacunar infarct, acute (HCC) 11/13/2022   CVA (cerebral vascular accident) (HCC) 11/09/2022   Hyperlipidemia 11/09/2022   Obesity (BMI 30-39.9) 11/09/2022   Sensorineural hearing loss (SNHL), bilateral 06/10/2021   Tinnitus of both ears 06/10/2021    PCP: Laurann Montana, MD REFERRING PROVIDER: Ihor Austin, NP  REFERRING DIAG: vertigo  THERAPY DIAG:  No diagnosis found.  ONSET DATE: 2 yrs, 2022  Rationale for Evaluation and Treatment: Rehabilitation  SUBJECTIVE:   SUBJECTIVE STATEMENT: I notice the dizziness, it will last briefly, usually with rolling over in bed, once recently when looking up to fix my blinds.  Usually will occur like that for a few days in a row, then it will stop for a while, maybe a week and then occur again. Pt accompanied by: self  PERTINENT HISTORY: CVA, pontine 2 yrs ago.  Experiencing vertigo since that time when rolling over in bed.  Referred by neurologist for PT eval to assess for BPPV or more due to vestibular changes   PAIN:  Are you having pain? No  PRECAUTIONS: None  RED FLAGS: None   WEIGHT BEARING RESTRICTIONS: No  FALLS: Has patient fallen in last 6 months? No  LIVING ENVIRONMENT: Lives with: lives alone Lives in: House/apartment Stairs: No Has following equipment at home: None  PLOF: Independent  PATIENT GOALS: get rid of dizziness  OBJECTIVE:   DIAGNOSTIC FINDINGS: CT in February without abnormalities  COGNITION: Overall cognitive status: Within functional limits for tasks  assessed   SENSATION: WFL  EDEMA:  na  MUSCLE TONE: wnl  POSTURE:  forward head and increased thoracic kyphosis  Cervical ROM:    Active A/PROM (deg) eval  Flexion wfl  Extension wfl  Right lateral flexion wfl  Left lateral flexion wfl  Right rotation wfl  Left rotation wfl  (Blank rows = not tested)  STRENGTH: wfl B UE    BED MOBILITY:  Sit to supine Complete Independence Supine to sit Complete Independence Rolling to Right Complete Independence Rolling to Left Complete Independence  TRANSFERS: Wnl, I   GAIT:wnl, no deficits noted  FUNCTIONAL TESTS:  Dynamic Gait Index: TBD  PATIENT SURVEYS:  DHI 16  VESTIBULAR ASSESSMENT:  GENERAL OBSERVATION: pleasant, healthy female, moves head easily while talking, no issues   SYMPTOM BEHAVIOR:  Subjective history: intermittent periods of brief vertigo noted primarily with bed mobility, also with bending down and recently looking up to fix her blinds.    Non-Vestibular symptoms: headaches and tinnitus  Type of dizziness: Spinning/Vertigo and Unsteady with head/body turns  Frequency: daily, often for one week, then goes away for one to 2 weeks  Duration: a few seconds   Aggravating factors: Induced by position change: lying supine, rolling to the right, rolling to the left, and supine to sit and Induced by motion: looking up at the ceiling, bending down to the ground, and turning head quickly  Relieving factors: head stationary  Progression of symptoms: unchanged  OCULOMOTOR EXAM:  Ocular Alignment: abnormal  Ocular ROM: No Limitations  Spontaneous Nystagmus: absent  Gaze-Induced Nystagmus: right beating  with right gaze  Smooth Pursuits: intact  Saccades: intact  Convergence/Divergence: 4 cm    VESTIBULAR - OCULAR REFLEX:   Slow VOR: Normal  VOR Cancellation: Normal  Head-Impulse Test: HIT Right: negative HIT Left: negative  Dynamic Visual Acuity: Not able to be assessed   POSITIONAL TESTING: Right  Dix-Hallpike: downbeating, right nystagmus Left Dix-Hallpike: no nystagmus Right Roll Test: no nystagmus Left Roll Test: no nystagmus  MOTION SENSITIVITY: NA today  Motion Sensitivity Quotient Intensity: 0 = none, 1 = Lightheaded, 2 = Mild, 3 = Moderate, 4 = Severe, 5 = Vomiting  Intensity  1. Sitting to supine   2. Supine to L side   3. Supine to R side   4. Supine to sitting   5. L Hallpike-Dix   6. Up from L    7. R Hallpike-Dix   8. Up from R    9. Sitting, head tipped to L knee   10. Head up from L knee   11. Sitting, head tipped to R knee   12. Head up from R knee   13. Sitting head turns x5   14.Sitting head nods x5   15. In stance, 180 turn to L    16. In stance, 180 turn to R     OTHOSTATICS: not done  FUNCTIONAL GAIT: NA today   VESTIBULAR TREATMENT:                                                                                                   DATE: 07/27/23:  Completed the FGA Reassessed for posterior canal, + R down beating nystagmus with this position, for 7-8 sec, resolved quickly, repeated Epley and retested a total of 3 times.   Instructed in home [facility] program 07/20/23  Canalith Repositioning:  Epley Right: Number of Reps: 2 x, on 3rd reassessment no dizziness or nystagmus noted  PATIENT EDUCATION: Education details: POC, goals Person educated: Patient Education method: Explanation, Demonstration, and Tactile cues Education comprehension: verbalized understanding  HOME EXERCISE PROGRAM: Access Code: [email or web address] URL: [email or web address] Date: 07/27/2023 Prepared by: Caralee Ates  Exercises - Brandt-Daroff Vestibular Exercise  - 1 x daily - 5 x weekly - 1 sets - 5 reps - 30 hold GOALS: Goals reviewed with patient? Yes  SHORT TERM GOALS: Target date: 2 weeks, 01/02/23  I home program as needed Baseline: Goal status: INITIAL   LONG TERM GOALS: Target date: 08/31/23  Clear testing , Sx free R posterior canalithiasis  testing Baseline: + R post canal testing Goal status: INITIAL  2.  DGI 23/24 Baseline: TBD Goal status: INITIAL9/17/24:  FGA 30/30 MET  3.  DHI 5% or less  Baseline: 15% Goal status: INITIAL   ASSESSMENT:  CLINICAL IMPRESSION: Patient is a 62 y.o. female who participated in skilled PT treatment for dizziness, vertigo. Her Sx have occurred for 2 years, correlating with the time in which she suffered a pontine CVA. Today she had brief nystagmus, geotrophic , R posterior canal testing, therefore performed Epley x 3 . As her Sx are long standing and she was consistent again today with  posterior canal testing, determined to utilize the barnd Daroff at home.  Plan to hold skilled PT at this time and patient to call back if needed within the next 2 months if Sx increase or reoccur.T OBJECTIVE IMPAIRMENTS: decreased activity tolerance and dizziness.   ACTIVITY LIMITATIONS: bending and bed mobility  PARTICIPATION LIMITATIONS: cleaning and community activity  PERSONAL FACTORS: Behavior pattern, Past/current experiences, Time since onset of injury/illness/exacerbation, and 1 comorbidity: h/o CVA 2 yrs ago, HTN  are also affecting patient's functional outcome.   REHAB POTENTIAL: Good  CLINICAL DECISION MAKING: Stable/uncomplicated  EVALUATION COMPLEXITY: Low   PLAN:  PT FREQUENCY: 1x/week  PT DURATION: 6 weeks  PLANNED INTERVENTIONS: Therapeutic exercises, Therapeutic activity, Neuromuscular re-education, Balance training, Gait training, Patient/Family education, Self Care, Vestibular training, and Canalith repositioning  PLAN FOR NEXT SESSION: hold at this time.  Jadrien Narine L Johnpaul Gillentine, PT, DPT, OCS 07/27/2023, 3:04 PM

## 2023-07-28 ENCOUNTER — Telehealth: Payer: Self-pay | Admitting: Physical Medicine and Rehabilitation

## 2023-07-28 NOTE — Telephone Encounter (Signed)
Asked staff to call patient to let her know I would complete her appeal letter today

## 2023-08-02 ENCOUNTER — Encounter: Payer: Self-pay | Admitting: Adult Health

## 2023-08-03 MED ORDER — MECLIZINE HCL 25 MG PO TABS: 25.0000 mg | ORAL_TABLET | Freq: Two times a day (BID) | ORAL | 5 refills | Status: AC | PRN

## 2023-08-24 ENCOUNTER — Encounter: Payer: Self-pay | Admitting: Physical Medicine and Rehabilitation

## 2023-08-24 ENCOUNTER — Encounter: Payer: 59 | Attending: Physical Medicine and Rehabilitation | Admitting: Physical Medicine and Rehabilitation

## 2023-08-24 VITALS — BP 118/72 | HR 71 | Ht 62.0 in | Wt 166.0 lb

## 2023-08-24 DIAGNOSIS — I639 Cerebral infarction, unspecified: Secondary | ICD-10-CM | POA: Diagnosis present

## 2023-08-24 DIAGNOSIS — G4701 Insomnia due to medical condition: Secondary | ICD-10-CM | POA: Insufficient documentation

## 2023-08-24 DIAGNOSIS — R252 Cramp and spasm: Secondary | ICD-10-CM | POA: Insufficient documentation

## 2023-08-24 DIAGNOSIS — R200 Anesthesia of skin: Secondary | ICD-10-CM | POA: Diagnosis present

## 2023-08-24 DIAGNOSIS — R400 Somnolence: Secondary | ICD-10-CM | POA: Insufficient documentation

## 2023-08-24 DIAGNOSIS — H8110 Benign paroxysmal vertigo, unspecified ear: Secondary | ICD-10-CM | POA: Insufficient documentation

## 2023-08-24 NOTE — Progress Notes (Signed)
Subjective:    Patient ID: Joy Shelton, female    DOB: 09-19-1961, 62 y.o.   MRN: [id number]  HPI Joy Shelton is a 62 year old woman who presents for follow-up of CVA.  1) Insomnia -sleeps longer in the morning now -not getting enough sunlight exposure during the day  2) Lip numbness -improved  3) Muscle cramps -present more at night -has pain in her foot  4) Urinary urgency:  -she wears Depends -she has about a minute after she feels the urge to urinate before she urinates -she is only incontinent when there is no bathroom near her -during the day she is going to the bathroom at a normal frequency  5) Daytime somnolence  6) Lightheaded and dizzy in the morning -this is usually when she looks sideways -she feels her eyes has to catch up to her head moving -denies room spinning -started in the last week -feels she has widened her walking stance  7) Elevated fasting blood sugar -ranging 95-111  8) Fatigue: -having difficulty keeping up with the pace of work -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 30 ability to climb steps?  yes do you drive?  yes Do you have any goals in this area?  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  no  Physicians involved in your care Any changes  since last visit?  no   History reviewed. No pertinent family history. Social History   Socioeconomic History   Marital status: Single    Spouse name: Not on file   Number of children: Not on file   Years of education: Not on file   Highest education level: Not on file  Occupational History   Not on file  Tobacco Use   Smoking status: Never   Smokeless tobacco: Never  Vaping Use   Vaping status: Never Used  Substance and Sexual Activity   Alcohol use: Not Currently   Drug use: Never   Sexual activity: Not Currently  Other Topics Concern   Not on file  Social History Narrative   Not on file   Social Determinants of Health   Financial Resource Strain: Not on file  Food Insecurity: No Food Insecurity (11/09/2022)   Hunger Vital Sign    Worried About Running Out of Food in the Last Year: Never true    Ran Out of Food in the Last Year: Never true  Transportation Needs: No Transportation Needs (11/09/2022)   PRAPARE - Administrator, Civil Service (Medical): No    Lack of Transportation (Non-Medical): No  Physical Activity: Not on file  Stress: Not on file  Social Connections: Not on file   History reviewed. No pertinent surgical history. Past Medical History:  Diagnosis Date  Hypercholesteremia    Stroke (HCC) 11/09/2022   Ht 5\' 2"  (1.575 m)   BMI 29.63 kg/m   Opioid Risk Score:   Fall Risk Score:  `1  Depression screen [facility] 2/9     02/22/2023    1:11 PM 12/25/2022   10:01 AM  Depression screen PHQ 2/9  Decreased Interest 0 0  Down, Depressed, Hopeless 0 0  PHQ - 2 Score 0 0  Altered sleeping  3  Tired, decreased energy  1  Change in appetite  0  Feeling bad or failure about yourself   0  Trouble concentrating  0  Moving slowly or fidgety/restless  0  Suicidal thoughts  0  PHQ-9 Score  4  Difficult doing work/chores  Not difficult at all      Review of Systems  Musculoskeletal:        Left pain in Hip   All other systems reviewed and  are negative.      Objective:   Physical Exam  Gen: no distress, normal appearing, BMI 29.81, weight 163 lbs HEENT: oral mucosa pink and moist, NCAT Cardio: Reg rate Chest: normal effort, normal rate of breathing Abd: soft, non-distended Ext: no edema Psych: pleasant, normal affect Skin: intact Neuro: Alert and oriented x3      Assessment & Plan:   1) Insomnia: -discussed that this has improved -recommended red light therapy -Try to go outside near sunrise -Get exercise during the day.  -Turn off all devices an hour before bedtime.  -Teas that can benefit: chamomile, valerian root, Brahmi (Bacopa) -Can consider over the counter melatonin, magnesium, and/or L-theanine. Melatonin is an anti-oxidant with multiple health benefits. Magnesium is involved in greater than 300 enzymatic reactions in the body and most of [place] are deficient as our soil is often depleted. There are 7 different types of magnesium- Bioptemizer's is a supplement with all 7 types, and each has unique benefits. Magnesium can also help with constipation and anxiety.  -Pistachios naturally increase the production of melatonin -Cozy Earth bamboo bed sheets are free from toxic chemicals.  -Tart cherry juice or a tart cherry supplement can improve sleep and soreness post-workout  2) Muscle cramps: -increase magnesium gluconate to 500mg  HS -discussed that this has improved  3) Urinary urgency: -discussed timed toileting -discussed that this has improved   4) Daytime somnolence -continue CPAP, discussed that sleep apnea is a risk factor for stroke. Encouraged use of this -discussed that this has improved  5) Lightheadedness: -reviewed Bps, blood sugars, discussed that maybe she is dehydrated in the morning -recommended to drink a glass of water in the morning.  -discussed signs and symptoms of hydrocephalus since she mentioned the wide based gait  6) Prediabetes, impaired fasting blood sugar -continue  checking blood sugar -discussed continuous blood glucose monitoring -try to incorporate into your diet some of the following foods: 1) cinnamon- imitates effects of insulin, increasing glucose transport into cells ([place] or [place] cinnamon is best, least processed) 2) nuts- can slow down the blood sugar response of carbohydrate rich foods 3) oatmeal- contains and anti-inflammatory compound avenanthramide 4) whole-milk yogurt (best types are no sugar, [place] yogurt, or goat/sheep yogurt) 5) beans- high in protein, fiber, and vitamins, low glycemic index 6) broccoli- great source of vitamin A and C 7) quinoa- higher in protein and fiber than other grains 8) spinach- high in vitamin A, fiber, and protein 9) olive oil- reduces glucose levels, LDL, and triglycerides 10) salmon- excellent amount of omega-3-fatty acids 11)  walnuts- rich in antioxidants 12) apples- high in fiber and quercetin 13) carrots- highly nutritious with low impact on blood sugar 14) eggs- improve HDL (good cholesterol), high in protein, keep you satiated 15) turmeric: improves blood sugars, cardiovascular disease, and protects kidney health 16) garlic: improves blood sugar, blood pressure, pain 17) tomatoes: highly nutritious with low impact on blood sugar   7) Provided with list of supplements that can help with dyslipidemia: 1) Vitamin B3 500-4,000mg  in divided doses daily (would recommend starting low as can cause uncomfortable facial flushing if started at too high a dose) 2) Phytosterols 2.15 grams daily 3) Fermented soy 30-50 grams daily 4) EGCG (found in green tea): 500-1000mg  daily 5) Omega-3 fatty acids 3000-5,000mg  daily 6) Flax seed 40 grams daily 7) Monounsaturated fats 20-40 grams daily (olives, olive oil, nuts), also reduces cardiovascular disease 8) Sesame: 40 grams daily 9) Gamma/delta tocotrienols- a family of unsaturated forms of Vitamin E- 200mg  with dinner 10) Pantethine 900mg  daily in divided  doses 11) Resveratrol 250mg  daily 12) N Acetyl Cysteine 2000mg  daily in divided doses 13) Curcumin 2000-5000mg  in divided doses daily 14) Pomegranate juice: 8 ounces daily, also helps to lower blood pressure 15) Pomegranate seeds one cup daily, also helps to lower blood pressure 16) Citrus Bergamot 1000mg  daily, also helps with glucose control and weight loss 17) Vitamin C 500mg  daily 18) Quercetin 500-1000mg  daily 19) Glutathione 20) Probiotics 60-100 billion organisms per day 21) Fiber 22) Oats 23) Aged garlic (can eat as food or supplement of 600-900mg  per day) 24) Chia seeds 25 grams per day 25) Lycopene- carotenoid found in high concentrations in tomatoes. 26) Alpha linolenic acid 27) Flavonoids and anthocyanins 28) Wogonin- flavanoid that enhances reverse cholesterol transport 29) Coenzyme Q10 30) Pantethine- derivative of Vitamin B5: 300mg  three times per day or 450mg  twice per day with or without food 31) Barley and other whole grains 32) Orange juice 33) L- carnitine 34) L- Lysine 35) L- Arginine 36) Almonds 37) Morin 38) Rutin 39) Carnosine 40) Histidine  41) Kaempferol  42) Organosulfur compounds 43) Vitamin E 44) Oleic acid 45) RBO (ferulic acid gammaoryzanol) 46) grape seed extract 47) Red wine 48) Berberine HCL 500mg  daily or twice per day- more effective and with fewer adverse effects that ezetimibe monotherapy 49) red yeast rice 2400- 4800 mg/day 50) chlorella 51) Licorice   8) Fibromuscular dysplasia -discussed finding on her CTA  9) Overweight: -commended on weight loss -discussed that fatigue affects her ability to exercise -discussed her positive dietary changes  10) CVA: -completed disability paperwork for her -continue aspirin and statin  11) Fatigue: -B12 ordered, discussed that her level is at the low end of the normal range, prescribed B12 injection once per month -discussed that this impacts her ability to exercise  13) Yeast  infection: -UA/UC ordered  14) Lip numbness: -discussed that this has improved.   15. BPPV: -continue meclizine prn -discussed food allergen testing -discussed vestibular rehab

## 2023-08-26 ENCOUNTER — Encounter: Payer: Self-pay | Admitting: Adult Health

## 2023-09-23 ENCOUNTER — Ambulatory Visit: Payer: 59 | Admitting: Podiatry

## 2023-09-23 ENCOUNTER — Encounter: Payer: Self-pay | Admitting: Podiatry

## 2023-09-23 DIAGNOSIS — M722 Plantar fascial fibromatosis: Secondary | ICD-10-CM | POA: Diagnosis not present

## 2023-09-23 MED ORDER — TRIAMCINOLONE ACETONIDE 10 MG/ML IJ SUSP
10.0000 mg | Freq: Once | INTRAMUSCULAR | Status: AC
Start: 2023-09-23 — End: 2023-09-23
  Administered 2023-09-23: 10 mg via INTRA_ARTICULAR

## 2023-09-23 NOTE — Progress Notes (Signed)
Subjective:   Patient ID: Joy Shelton, female   DOB: 62 y.o.   MRN: [id number]   HPI With a lot of pain in the plantar aspect of the left arch minimal on the right with history of nodular formation over the last few months.  States it has been hurting for several months and she did have a history of a stroke earlier this year.  Patient does not smoke as slow speech but overall good mental acuity   Review of Systems  All other systems reviewed and are negative.       Objective:  Physical Exam Vitals and nursing note reviewed.  Constitutional:      Appearance: She is well-developed.  Pulmonary:     Effort: Pulmonary effort is normal.  Musculoskeletal:        General: Normal range of motion.  Skin:    General: Skin is warm.  Neurological:     Mental Status: She is alert.     Neurovascular status found to be intact muscle strength was found to be adequate range of motion adequate with patient noted to have discomfort of the mid arch area left fluid buildup around the area and pain with palpation.  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  Patient will be seen back to recheck with sterile dressing applied     Patient

## 2023-10-10 ENCOUNTER — Encounter: Payer: Self-pay | Admitting: Adult Health

## 2023-10-18 NOTE — Telephone Encounter (Signed)
Noted  

## 2023-10-28 ENCOUNTER — Encounter: Payer: Self-pay | Admitting: Adult Health

## 2023-11-17 NOTE — Progress Notes (Signed)
 [facility] [address] [place]. [place] [address] [phone]       OFFICE FOLLOW-UP NOTE  Joy Shelton Date of Birth:  1961/06/20 Medical Record Number:  [id number]    Primary neurologist: Dr. Rosemarie Reason for visit: Stroke follow-up   Chief Complaint  Patient presents with   Follow-up    Patient in room #8 and alone. Patient states she has cramps in her legs and feet while laying in bed.       HPI:   Update 11/17/2023 JM: returns for stroke follow up. Overall stable without new stroke/TIA symptoms.  Reports she has been doing well since prior visit.  Reports gradual improvement of cognitive issues and difficulty focusing.  She has since retired, last day 12/13 (was having difficulty obtaining STD). She may return back on a part time basis.  No recent vertigo/dizziness.  She is scheduled for neurocognitive evaluation with Dr. Jackquline on 3/17 (referred by PCP).  Denies new stroke/TIA symptoms. Compliant on aspirin  and Crestor , denies side effects. Routinely follows up with PCP for stroke follow up.   Has not used her CPAP since prior visit.  Was previously staying at her house or her fathers and would not bring machine, now she just got out of the habit of using. Use of nasal cradle, tolerating well. At times, can feel pressure is too much and difficulty exhaling. She plans on restarting therapy with nightly use, has been noticing some nonrestorative sleep. Prior DME referral placed to [facility].  She did bring machine for download.  Did have intermittent use up until July, current AutoPap setting 4-20 with EPR 1, pressure in the 95th percentile 8.0 (maximum 8.9), residual AHI 0.4, leaks in the 95 percentile 5.8.       History provided for reference purposes only Update 07/01/2023 JM: Returns for stroke follow-up visit.  Has been stable without new stroke/TIA symptoms.  Reports improvement of balance since prior visit.  She does report intermittent  episodes of dizziness/vertigo worse with quick head movements or turning in bed, has been present since her stroke. Only lasts for short duration, occasionally can be associated with left ear pain but this only lasts for a few seconds. Can occurs for several days in a row and then can go without symptoms for up to a month.  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  She does continue to experience daytime fatigue, fatigues very quickly and trouble focusing, in the process of applying for short-term disability due to difficulty performing job function.  Recent B12 level 254, now on B12 injection, first injection 2 weeks ago, plans on monthly injection for 3 months.  Denies new stroke/TIA symptoms.  Compliant on aspirin  and Crestor  without side effects Routinely follows with PCP for stroke risk factor management  Completed sleep smart study in June. Has not used CPAP in the past couple of months. She will rotate between staying at her house and her fathers house, bringing her CPAP machine back and forth can be burdensome   Initial visit 01/29/2023 Dr. Rosemarie: Joy Shelton is a pleasant 63 year old Caucasian lady seen today for initial office follow-up visit following hospital consideration for stroke disease.  History is obtained from the patient and review of electronic medical records.  I personally reviewed pertinent available imaging films in PACS.  She has past medical history of hyperlipidemia and difficulty swallowing and presented on 11/08/2022 to [facility] complaining of headache and tingling  in the left and fingers..  Her symptoms started with burning sensation in her eyes and a mild headache that lasted a few minutes on.  This returned later on in the and more tingling in her face as well as hands.  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  CT angiogram of the head and neck showed subtle vessel  wall irregularity in the mid cervical ICAs jesting possible fibromuscular dysplasia but no large vessel stenosis or lesion.  Minimal calcification of carotid palpitations.  2D echo showed ejection fraction of 55%.  LDL cholesterol was elevated at 139 mg. She was started on dual antiplatelet therapy and transfer to inpatient rehab and she has done well at home.  She feels she is almost back to baseline.  He occasionally feels lightheaded and off-balance he is tired of quickly and needs a further movement.  He has returned to work part-time is mostly working from home with very poor for back to work full-time no.  He is tolerating aspirin  well without bruising or bleeding.  Crestor  40 mg daily and having lipid profile checked recently on 14/24 which showed LDL cholesterol to be optimal at 68 mg%.  Her blood pressure is under good control and today it is 121/70.  Patient participated in the sleep smart stroke prevention study and tested positive for obstructive sleep apnea and was randomized to the CPAP treatment.  She states she is been quite compliant with using the CPAP.  He has been eating healthy and active and has lost some weight.  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  Is not affected.  She has been evaluated by ENT in the past plans on having colonoscopy soon I discussed periprocedural need for holding aspirin  slight but acceptable risk of stroke/TIA if she is willing.  She denies any prior history of strokes TIA seizures or significant neurological problems.    ROS:   14 system review of systems is positive for those listed in HPI and all other systems negative  PMH:  Past Medical History:  Diagnosis Date   Hypercholesteremia    Stroke (HCC) 11/09/2022    Social History:  Social History   Socioeconomic History   Marital status: Single    Spouse name: Not on file   Number of children: Not on file   Years of education: Not on file    Highest education level: Not on file  Occupational History   Not on file  Tobacco Use   Smoking status: Never   Smokeless tobacco: Never  Vaping Use   Vaping status: Never Used  Substance and Sexual Activity   Alcohol use: Not Currently   Drug use: Never   Sexual activity: Not Currently  Other Topics Concern   Not on file  Social History Narrative   Not on file   Social Drivers of Health   Financial Resource Strain: Not on file  Food Insecurity: No Food Insecurity (11/09/2022)   Hunger Vital Sign    Worried About Running Out of Food in the Last Year: Never true    Ran Out of Food in the Last Year: Never true  Transportation Needs: No Transportation Needs (11/09/2022)   PRAPARE - Administrator, Civil Service (Medical): No    Lack of Transportation (Non-Medical): No  Physical Activity: Not on file  Stress: Not on file  Social Connections: Not on file  Intimate Partner Violence: Not At Risk (11/09/2022)   Humiliation, Afraid, Rape, and Kick questionnaire    Fear of Current or Ex-Partner: No    Emotionally Abused: No    Physically Abused: No    Sexually Abused: No    Medications:   Current Outpatient Medications on File Prior to Visit  Medication Sig Dispense Refill   acetaminophen  (TYLENOL ) 325 MG tablet Take 1-2 tablets (325-650 mg total) by mouth every 4 (four) hours as needed for mild pain.     aspirin  EC 81 MG tablet Take 1 tablet (81 mg total) by mouth daily. Swallow whole. 30 tablet 12   Cyanocobalamin  (B-12 COMPLIANCE INJECTION IJ) Inject as directed every 30 (thirty) days. Will be getting one injection a month for 3 months.     magnesium  gluconate (MAGONATE) 500 MG tablet Take 0.5 tablets (250 mg total) by mouth at bedtime. 15 tablet 0   rosuvastatin  (CRESTOR ) 40 MG tablet Take 1 tablet (40 mg total) by mouth daily. 30 tablet 0   meclizine  (ANTIVERT ) 25 MG tablet Take 1 tablet (25 mg total) by mouth 2 (two) times daily as needed for dizziness. (Patient not  taking: Reported on 11/18/2023) 60 tablet 5   Needles & Syringes MISC 1 each by Does not apply route every 30 (thirty) days. (Patient not taking: Reported on 11/18/2023) 1 each 3   No current facility-administered medications on file prior to visit.    Allergies:   Allergies  Allergen Reactions   Lipitor  [Atorvastatin ]     Muscle aches    Physical Exam Today's Vitals   11/18/23 0757  BP: 112/62  Pulse: 66  Weight: 173 lb (78.5 kg)  Height: 5' 2 (1.575 m)   Body mass index is 31.64 kg/m.  General: Mildly obese very pleasant middle-age Caucasian lady, seated, in no evident distress  Neurologic Exam Mental Status: Awake and fully alert.  Fluent speech and language.  Oriented to place and time. Recent and remote memory intact. Attention span, concentration and fund of knowledge appropriate. Mood and affect appropriate.  Cranial Nerves: Pupils equal, briskly reactive to light. Extraocular movements full without nystagmus. Visual fields full to confrontation. Hearing intact. Facial sensation intact. Face, tongue, palate moves normally and symmetrically.  Motor: Normal bulk and tone. Normal strength in all tested extremity muscles. Sensory.: intact to touch ,pinprick .position and vibratory sensation.  Coordination: Rapid alternating movements normal in all extremities. Finger-to-nose and heel-to-shin performed accurately bilaterally. Gait and Station: Arises from chair without difficulty. Stance is normal. Gait demonstrates normal stride length and balance without use of AD Reflexes: 1+ and symmetric. Toes downgoing.        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  Vascular risk factors of hyperlipidemia, and obstructive sleep apnea.    1.  Bilateral pontine stroke  -Residual mild cognitive difficulties, gradually improving, she is now retired  -prior c/o vertigo which has since  resolved  -Continue aspirin  and Crestor  for secondary stroke prevention measures managed/prescribed by PCP  -Continue to follow with PCP for aggressive stroke risk factor management including HLD with LDL goal <70  2.  OSA  -has been noncompliant with difficulty tolerating pressure, will adjust from 4-20 with EPR 1 to 4-12 with EPR 3, continue use of nasal cradle mask, order placed to DME [facility]  -discussed importance of nightly CPAP compliance with ensuring greater than 4 hours per night for optimal benefit.  Discussed increased risk of recurrent  stroke with untreated sleep apnea  -determined in hospital by sleep smart trial with total AHI 18.6/h  -Completed sleep smart trial 04/2023       Follow-up in 6 months primarily for sleep apnea follow-up or call earlier if needed     I spent 30 minutes of face-to-face and non-face-to-face time with patient.  This included previsit chart review, lab review, study review, order entry, electronic health record documentation, patient education and discussion regarding above diagnoses and treatment plan and answered all other questions to patient's satisfaction  Harlene Bogaert, [facility]  [facility] [address] [place], [place] [address]  Phone [phone] Fax [phone] Note: This document was prepared with digital dictation and possible smart phrase technology. Any transcriptional errors that result from this process are unintentional.

## 2023-11-18 ENCOUNTER — Encounter: Payer: Self-pay | Admitting: Adult Health

## 2023-11-18 ENCOUNTER — Ambulatory Visit (INDEPENDENT_AMBULATORY_CARE_PROVIDER_SITE_OTHER): Payer: BC Managed Care – PPO | Admitting: Adult Health

## 2023-11-18 VITALS — BP 112/62 | HR 66 | Ht 62.0 in | Wt 173.0 lb

## 2023-11-18 DIAGNOSIS — I6350 Cerebral infarction due to unspecified occlusion or stenosis of unspecified cerebral artery: Secondary | ICD-10-CM

## 2023-11-18 DIAGNOSIS — G4733 Obstructive sleep apnea (adult) (pediatric): Secondary | ICD-10-CM

## 2023-11-18 NOTE — Patient Instructions (Signed)
 Highly recommend restarting CPAP therapy as this can help get more restful sleep where you feel more refreshed in the morning, can help with your cognition and focusing and decrease risk of recurrent stroke. I will send an order to your DME company [facility] [employer] to adjust your pressure setting to hopefully improve tolerance  Continue aspirin  81 mg daily  and Crestor  for secondary stroke prevention  Continue to follow up with PCP regarding cholesterol management  Maintain strict control of cholesterol with LDL cholesterol (bad cholesterol) goal below 70 mg/dL.   Signs of a Stroke? Follow the BEFAST method:  Balance Watch for a sudden loss of balance, trouble with coordination or vertigo Eyes Is there a sudden loss of vision in one or both eyes? Or double vision?  Face: Ask the person to smile. Does one side of the face droop or is it numb?  Arms: Ask the person to raise both arms. Does one arm drift downward? Is there weakness or numbness of a leg? Speech: Ask the person to repeat a simple phrase. Does the speech sound slurred/strange? Is the person confused ? Time: If you observe any of these signs, call 911.     Followup in the future with me in 6 months or call earlier if needed       Thank you for coming to see us  at [facility]. I hope we have been able to provide you high quality care today.  You may receive a patient satisfaction survey over the next few weeks. We would appreciate your feedback and comments so that we may continue to improve ourselves and the health of our patients.

## 2023-12-07 ENCOUNTER — Encounter: Payer: Self-pay | Admitting: Family Medicine

## 2023-12-16 DIAGNOSIS — E785 Hyperlipidemia, unspecified: Secondary | ICD-10-CM | POA: Diagnosis not present

## 2023-12-16 DIAGNOSIS — E669 Obesity, unspecified: Secondary | ICD-10-CM | POA: Diagnosis not present

## 2023-12-16 DIAGNOSIS — N898 Other specified noninflammatory disorders of vagina: Secondary | ICD-10-CM | POA: Diagnosis not present

## 2023-12-16 DIAGNOSIS — G4730 Sleep apnea, unspecified: Secondary | ICD-10-CM | POA: Diagnosis not present

## 2023-12-16 DIAGNOSIS — Z8673 Personal history of transient ischemic attack (TIA), and cerebral infarction without residual deficits: Secondary | ICD-10-CM | POA: Diagnosis not present

## 2023-12-17 ENCOUNTER — Encounter: Payer: Self-pay | Admitting: Adult Health

## 2023-12-20 NOTE — Telephone Encounter (Signed)
 Pt would like advice. Please advise

## 2024-01-05 NOTE — Telephone Encounter (Signed)
 Pt was seen in January 2025.

## 2024-01-19 DIAGNOSIS — R419 Unspecified symptoms and signs involving cognitive functions and awareness: Secondary | ICD-10-CM | POA: Diagnosis not present

## 2024-01-19 DIAGNOSIS — Z8673 Personal history of transient ischemic attack (TIA), and cerebral infarction without residual deficits: Secondary | ICD-10-CM | POA: Diagnosis not present

## 2024-01-20 ENCOUNTER — Encounter: Payer: Self-pay | Admitting: Adult Health

## 2024-01-20 NOTE — Telephone Encounter (Addendum)
 Any concern with OTC cough meds with pt history of CVA?  Pt is on aspirin 81 mg daily  and Crestor for secondary stroke prevention

## 2024-01-20 NOTE — Telephone Encounter (Signed)
 Would just avoid any cold medications that can increase blood pressure.  Would recommend she speak to her pharmacist or PCP who can further guide her. Thank you.

## 2024-02-10 NOTE — Telephone Encounter (Signed)
 Neuropsychological eval report received from [facility], placed on NP desk for review.

## 2024-02-15 NOTE — Telephone Encounter (Signed)
 Noted.  Thank you.  Information/results also available via epic.

## 2024-03-15 ENCOUNTER — Other Ambulatory Visit: Payer: Self-pay | Admitting: Family Medicine

## 2024-03-15 DIAGNOSIS — Z1231 Encounter for screening mammogram for malignant neoplasm of breast: Secondary | ICD-10-CM

## 2024-03-23 ENCOUNTER — Encounter: Payer: Self-pay | Admitting: Podiatry

## 2024-03-23 ENCOUNTER — Ambulatory Visit: Admitting: Podiatry

## 2024-03-23 VITALS — Ht 62.0 in | Wt 173.0 lb

## 2024-03-23 DIAGNOSIS — M722 Plantar fascial fibromatosis: Secondary | ICD-10-CM

## 2024-03-23 DIAGNOSIS — L820 Inflamed seborrheic keratosis: Secondary | ICD-10-CM | POA: Diagnosis not present

## 2024-03-23 MED ORDER — TRIAMCINOLONE ACETONIDE 10 MG/ML IJ SUSP
10.0000 mg | Freq: Once | INTRAMUSCULAR | Status: AC
Start: 2024-03-23 — End: 2024-03-23
  Administered 2024-03-23: 10 mg via INTRA_ARTICULAR

## 2024-03-27 NOTE — Progress Notes (Signed)
 Subjective:   Patient ID: Joy Shelton, female   DOB: 63 y.o.   MRN: [id number]   HPI Patient has reoccurrence of discomfort in the plantar aspect of the left heel at the insertional point of the tendon calcaneus with fluid buildup that did well for a number of months   ROS      Objective:  Physical Exam  Acute plantar fasciitis left with inflammation fluid of the medial band with pressure plantar fasciitis     Assessment:  Plantar fasciitis left inflammation fluid of the medial band reoccurrence     Plan:  H&P reviewed discussed the importance of support anti-inflammatories and sterile prep reinjected the fascia at insertion 3 mg Kenalog  5 mg Xylocaine

## 2024-03-28 ENCOUNTER — Ambulatory Visit
Admission: RE | Admit: 2024-03-28 | Discharge: 2024-03-28 | Disposition: A | Discharge status: Home / Self Care | Source: Ambulatory Visit | Attending: Family Medicine | Admitting: Family Medicine

## 2024-03-28 DIAGNOSIS — Z1231 Encounter for screening mammogram for malignant neoplasm of breast: Secondary | ICD-10-CM

## 2024-05-09 ENCOUNTER — Other Ambulatory Visit (HOSPITAL_BASED_OUTPATIENT_CLINIC_OR_DEPARTMENT_OTHER): Payer: Self-pay | Admitting: Family Medicine

## 2024-05-09 DIAGNOSIS — Z124 Encounter for screening for malignant neoplasm of cervix: Secondary | ICD-10-CM | POA: Diagnosis not present

## 2024-05-09 DIAGNOSIS — Z0000 Encounter for general adult medical examination without abnormal findings: Secondary | ICD-10-CM | POA: Diagnosis not present

## 2024-05-09 DIAGNOSIS — Z23 Encounter for immunization: Secondary | ICD-10-CM | POA: Diagnosis not present

## 2024-05-09 DIAGNOSIS — Z113 Encounter for screening for infections with a predominantly sexual mode of transmission: Secondary | ICD-10-CM | POA: Diagnosis not present

## 2024-05-09 DIAGNOSIS — E2839 Other primary ovarian failure: Secondary | ICD-10-CM

## 2024-05-09 DIAGNOSIS — E785 Hyperlipidemia, unspecified: Secondary | ICD-10-CM | POA: Diagnosis not present

## 2024-05-09 DIAGNOSIS — E559 Vitamin D deficiency, unspecified: Secondary | ICD-10-CM | POA: Diagnosis not present

## 2024-05-09 DIAGNOSIS — R252 Cramp and spasm: Secondary | ICD-10-CM | POA: Diagnosis not present

## 2024-05-09 DIAGNOSIS — R7303 Prediabetes: Secondary | ICD-10-CM | POA: Diagnosis not present

## 2024-05-09 DIAGNOSIS — G4730 Sleep apnea, unspecified: Secondary | ICD-10-CM | POA: Diagnosis not present

## 2024-05-17 ENCOUNTER — Ambulatory Visit: Payer: BC Managed Care – PPO | Admitting: Adult Health

## 2024-05-18 NOTE — Progress Notes (Signed)
 [facility] [address] [place]. [place] [address] [phone]       OFFICE FOLLOW-UP NOTE  Joy Shelton Date of Birth:  22-Mar-1961 Medical Record Number:  [id number]    Primary neurologist: Dr. Rosemarie Reason for visit: Stroke follow-up   Chief Complaint  Patient presents with   Cerebrovascular Accident    Rm8, alone, Cva followup 25months:pt stated that she is more fatigued in the past month than she had been but she admitted that she had been exerting herself more        HPI:   Update 05/19/2024 JM: Patient returns for follow-up visit unaccompanied.  Overall stable from stroke standpoint without new stroke/TIA symptoms.  Cognition overall stable.  Underwent neurocognitive testing which was overall normal and diagnosed with subjective cognitive impairment.  Advised to follow back up with PCP to check lab work for reversible causes if not already completed and discussed importance of sleep apnea treatment.  Reports compliance on aspirin  and Crestor .  Routinely follows with PCP for stroke risk factor management.  She does complain of increased fatigue over the past couple of months especially if she sits, she can fall asleep easily.  Recent lab work by PCP showed vitamin D  deficiency, does not appear B12 level checked although does have history of B12 deficiency.  In regards to her sleep apnea, it was requested at prior visit for pressure setting change from 4-20 to 4-12 due to difficulty tolerating pressure but for some reason never completed.  She currently feels that she is tolerating the pressure well and has been working on improving compliance.  Currently using nasal pillow which she feels she tolerates well.  For unclear reason, she still has not been set up with new supplies and continues to use same supplies that she initially received with machine 2 years ago.   Compliance report over the past 30 days shows suboptimal usage with 14 out of 30 days and 11  days greater than 4 hours.  Apnea being well treated when using with residual AHI 0.3/h.  Leaks in the 95th percentile 6.9.  Pressure in the 95th percentile 7.0 on pressure setting of 4-20 with EPR 1.       History provided for reference purposes only Update 11/17/2023 JM: returns for stroke follow up. Overall stable without new stroke/TIA symptoms.  Reports she has been doing well since prior visit.  Reports gradual improvement of cognitive issues and difficulty focusing.  She has since retired, last day 12/13 (was having difficulty obtaining STD). She may return back on a part time basis.  No recent vertigo/dizziness.  She is scheduled for neurocognitive evaluation with Dr. Jackquline on 3/17 (referred by PCP).  Denies new stroke/TIA symptoms. Compliant on aspirin  and Crestor , denies side effects. Routinely follows up with PCP for stroke follow up.   Has not used her CPAP since prior visit.  Was previously staying at her house or her fathers and would not bring machine, now she just got out of the habit of using. Use of nasal cradle, tolerating well. At times, can feel pressure is too much and difficulty exhaling. She plans on restarting therapy with nightly use, has been noticing some nonrestorative sleep. Prior DME referral placed to [facility].  She did bring machine for download.  Did have intermittent use up until July, current AutoPap setting 4-20 with EPR 1, pressure in the 95th percentile 8.0 (maximum 8.9), residual AHI 0.4, leaks in the 95 percentile 5.8.  Update 07/01/2023 JM:  Returns for stroke follow-up visit.  Has been stable without new stroke/TIA symptoms.  Reports improvement of balance since prior visit.  She does report intermittent episodes of dizziness/vertigo worse with quick head movements or turning in bed, has been present since her stroke. Only lasts for short duration, occasionally can be associated with left ear pain but this only lasts for a few seconds. Can occurs for several  days in a row and then can go without symptoms for up to a month.  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  She does continue to experience daytime fatigue, fatigues very quickly and trouble focusing, in the process of applying for short-term disability due to difficulty performing job function.  Recent B12 level 254, now on B12 injection, first injection 2 weeks ago, plans on monthly injection for 3 months.  Denies new stroke/TIA symptoms.  Compliant on aspirin  and Crestor  without side effects Routinely follows with PCP for stroke risk factor management  Completed sleep smart study in June. Has not used CPAP in the past couple of months. She will rotate between staying at her house and her fathers house, bringing her CPAP machine back and forth can be burdensome   Initial visit 01/29/2023 Dr. Rosemarie: Joy Shelton is a pleasant 63 year old Caucasian lady seen today for initial office follow-up visit following hospital consideration for stroke disease.  History is obtained from the patient and review of electronic medical records.  I personally reviewed pertinent available imaging films in PACS.  She has past medical history of hyperlipidemia and difficulty swallowing and presented [facility] complaining of headache and tingling in the left and fingers..  Her symptoms started with burning sensation in her eyes and a mild headache that lasted a few minutes on.  This returned later on in the and more tingling in her face as well as hands.  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  CT angiogram of the head and neck showed subtle vessel wall irregularity in the mid cervical ICAs jesting possible fibromuscular dysplasia but no large vessel stenosis or lesion.  Minimal calcification of carotid palpitations.  2D echo showed ejection fraction of 55%.  LDL cholesterol was elevated at 139  mg. She was started on dual antiplatelet therapy and transfer to inpatient rehab and she has done well at home.  She feels she is almost back to baseline.  He occasionally feels lightheaded and off-balance he is tired of quickly and needs a further movement.  He has returned to work part-time is mostly working from home with very poor for back to work full-time no.  He is tolerating aspirin  well without bruising or bleeding.  Crestor  40 mg daily and having lipid profile checked recently on 14/24 which showed LDL cholesterol to be optimal at 68 mg%.  Her blood pressure is under good control and today it is 121/70.  Patient participated in the sleep smart stroke prevention study and tested positive for obstructive sleep apnea and was randomized to the CPAP treatment.  She states she is been quite compliant with using the CPAP.  He has been eating healthy and active and has lost some weight.  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  Is not affected.  She has been evaluated by ENT in the past plans on having colonoscopy soon I discussed periprocedural need for holding  aspirin  slight but acceptable risk of stroke/TIA if she is willing.  She denies any prior history of strokes TIA seizures or significant neurological problems.    ROS:   14 system review of systems is positive for those listed in HPI and all other systems negative  PMH:  Past Medical History:  Diagnosis Date   Hypercholesteremia    Stroke (HCC) 11/09/2022    Social History:  Social History   Socioeconomic History   Marital status: Single    Spouse name: Not on file   Number of children: Not on file   Years of education: Not on file   Highest education level: Not on file  Occupational History   Not on file  Tobacco Use   Smoking status: Never   Smokeless tobacco: Never  Vaping Use   Vaping status: Never Used  Substance and Sexual Activity   Alcohol use: Not Currently   Drug  use: Never   Sexual activity: Not Currently  Other Topics Concern   Not on file  Social History Narrative   Not on file   Social Drivers of Health   Financial Resource Strain: Not on file  Food Insecurity: No Food Insecurity (11/09/2022)   Hunger Vital Sign    Worried About Running Out of Food in the Last Year: Never true    Ran Out of Food in the Last Year: Never true  Transportation Needs: No Transportation Needs (11/09/2022)   PRAPARE - Administrator, Civil Service (Medical): No    Lack of Transportation (Non-Medical): No  Physical Activity: Not on file  Stress: Not on file  Social Connections: Not on file  Intimate Partner Violence: Not At Risk (11/09/2022)   Humiliation, Afraid, Rape, and Kick questionnaire    Fear of Current or Ex-Partner: No    Emotionally Abused: No    Physically Abused: No    Sexually Abused: No    Medications:   Current Outpatient Medications on File Prior to Visit  Medication Sig Dispense Refill   acetaminophen  (TYLENOL ) 325 MG tablet Take 1-2 tablets (325-650 mg total) by mouth every 4 (four) hours as needed for mild pain.     aspirin  EC 81 MG tablet Take 1 tablet (81 mg total) by mouth daily. Swallow whole. 30 tablet 12   Cholecalciferol  (VITAMIN D ) 50 MCG (2000 UT) tablet Take 2,000 Units by mouth daily.     magnesium  gluconate (MAGONATE) 500 MG tablet Take 0.5 tablets (250 mg total) by mouth at bedtime. 15 tablet 0   meclizine  (ANTIVERT ) 25 MG tablet Take 1 tablet (25 mg total) by mouth 2 (two) times daily as needed for dizziness. 60 tablet 5   rosuvastatin  (CRESTOR ) 40 MG tablet Take 1 tablet (40 mg total) by mouth daily. 30 tablet 0   No current facility-administered medications on file prior to visit.    Allergies:   Allergies  Allergen Reactions   Atorvastatin  Other (See Comments)    Muscle aches  Other Reaction(s): muscle aches  Muscle spam   Muscle spam   Ezetimibe Other (See Comments)    Other Reaction(s): muscle  aches  Type: Side effects  Status: Active   Sertraline     Other Reaction(s): headaches  Reaction: headaches  Type: Side effects  Status: Active    Physical Exam Today's Vitals   05/19/24 0842  BP: (!) 95/49  Pulse: 67  Weight: 172 lb (78 kg)  Height: 5' 2 (1.575 m)   Body mass index is 31.46  kg/m.  General: Mildly obese very pleasant middle-age Caucasian lady, seated, in no evident distress  Neurologic Exam Mental Status: Awake and fully alert.  Fluent speech and language.  Oriented to place and time. Recent memory mildly impaired and remote memory intact. Attention span, concentration and fund of knowledge appropriate. Mood and affect appropriate.  Cranial Nerves: Pupils equal, briskly reactive to light. Extraocular movements full without nystagmus. Visual fields full to confrontation. Hearing intact. Facial sensation intact. Face, tongue, palate moves normally and symmetrically.  Motor: Normal bulk and tone. Normal strength in all tested extremity muscles. Sensory.: intact to touch ,pinprick .position and vibratory sensation.  Coordination: Rapid alternating movements normal in all extremities. Finger-to-nose and heel-to-shin performed accurately bilaterally. Gait and Station: Arises from chair without difficulty. Stance is normal. Gait demonstrates normal stride length and balance without use of AD Reflexes: 1+ and symmetric. Toes downgoing.        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  Vascular risk factors of hyperlipidemia, and obstructive sleep apnea.    1.  Bilateral pontine stroke  -Residual subjective cognitive difficulties, neuropsychology evaluation relatively normal, will check B12 level today, recent TSH normal. Discussed importance of CPAP compliance and routine cognitive and physical exercises  -Continue aspirin  and Crestor  for secondary stroke prevention measures  managed/prescribed by PCP  -Continue to follow with PCP for aggressive stroke risk factor management including HLD with LDL goal <70  - Stroke labs 05/2024: 85, A1c 5.8 - did discuss possibility of adding Zetia in addition to Crestor  40mg  daily but she does admit to poor dietary choices over the past several months. She wishes to try to adjust her diet first and if LDL remains >70 on repeat labs, will recommend initiating Zetia  2.  OSA  -still suboptimal compliance although improving. Disucssed importance of nightly usage with greater than 4 hours per night for optimal benefit.  She does complain of fatigue which will likely improve with CPAP compliance as well as reduce stroke and cardiac risk factors.   - Previously recommended pressure setting adjustment as she is currently set at 4-20 with EPR 1 (was set by research team initially) but currently tolerating pressure well therefore will leave at current settings -will place new order to DME [facility] to obtain new supplies   -determined in hospital 05/2022 by sleep smart trial with total AHI 18.6/h  -Completed sleep smart trial 04/2023       Follow-up in 1 year or call earlier if needed     I personally spent a total of 30 minutes in the care of the patient today including preparing to see the patient, performing a medically appropriate exam/evaluation, counseling and educating, placing orders, and documenting clinical information in the EHR.   Harlene Bogaert, AGNP-BC  [facility] [address] [place], [place] [address]  Phone [phone] Fax [phone] Note: This document was prepared with digital dictation and possible smart phrase technology. Any transcriptional errors that result from this process are unintentional.

## 2024-05-19 ENCOUNTER — Encounter: Payer: Self-pay | Admitting: Adult Health

## 2024-05-19 ENCOUNTER — Ambulatory Visit (INDEPENDENT_AMBULATORY_CARE_PROVIDER_SITE_OTHER): Admitting: Adult Health

## 2024-05-19 VITALS — BP 95/49 | HR 67 | Ht 62.0 in | Wt 172.0 lb

## 2024-05-19 DIAGNOSIS — E538 Deficiency of other specified B group vitamins: Secondary | ICD-10-CM

## 2024-05-19 DIAGNOSIS — R4189 Other symptoms and signs involving cognitive functions and awareness: Secondary | ICD-10-CM | POA: Diagnosis not present

## 2024-05-19 DIAGNOSIS — G4733 Obstructive sleep apnea (adult) (pediatric): Secondary | ICD-10-CM | POA: Diagnosis not present

## 2024-05-19 DIAGNOSIS — I6350 Cerebral infarction due to unspecified occlusion or stenosis of unspecified cerebral artery: Secondary | ICD-10-CM | POA: Diagnosis not present

## 2024-05-19 NOTE — Patient Instructions (Signed)
 Try to ensure you are using CPAP nightly as this will help fatigue and memory difficulties, it will also help reduce risk of recurrent strokes  Continue aspirin  81mg  daily and Crestor  40mg  daily for stroke risk factor management  We will check B12 levels today as low B12 levels can contribute to fatigue and memory difficulties       Follow-up in 1 year or call earlier if needed      Thank you for coming to see us  at [facility]. I hope we have been able to provide you high quality care today.  You may receive a patient satisfaction survey over the next few weeks. We would appreciate your feedback and comments so that we may continue to improve ourselves and the health of our patients.

## 2024-05-20 LAB — VITAMIN B12: Vitamin B-12: 261 pg/mL (ref 232–1245)

## 2024-05-22 ENCOUNTER — Ambulatory Visit: Payer: Self-pay | Admitting: Adult Health

## 2024-06-01 ENCOUNTER — Ambulatory Visit (HOSPITAL_BASED_OUTPATIENT_CLINIC_OR_DEPARTMENT_OTHER)
Admission: RE | Admit: 2024-06-01 | Discharge: 2024-06-01 | Disposition: A | Discharge status: Home / Self Care | Source: Ambulatory Visit | Attending: Family Medicine | Admitting: Family Medicine

## 2024-06-01 DIAGNOSIS — E2839 Other primary ovarian failure: Secondary | ICD-10-CM | POA: Diagnosis not present

## 2024-06-01 DIAGNOSIS — M8589 Other specified disorders of bone density and structure, multiple sites: Secondary | ICD-10-CM | POA: Diagnosis not present

## 2024-06-01 DIAGNOSIS — Z780 Asymptomatic menopausal state: Secondary | ICD-10-CM | POA: Diagnosis not present

## 2024-06-02 ENCOUNTER — Other Ambulatory Visit (HOSPITAL_BASED_OUTPATIENT_CLINIC_OR_DEPARTMENT_OTHER): Payer: Self-pay | Admitting: Family Medicine

## 2024-06-02 DIAGNOSIS — E2839 Other primary ovarian failure: Secondary | ICD-10-CM

## 2024-09-07 DIAGNOSIS — Z23 Encounter for immunization: Secondary | ICD-10-CM | POA: Diagnosis not present

## 2024-09-07 DIAGNOSIS — E538 Deficiency of other specified B group vitamins: Secondary | ICD-10-CM | POA: Diagnosis not present

## 2024-09-18 ENCOUNTER — Telehealth: Payer: Self-pay | Admitting: *Deleted

## 2024-09-18 NOTE — Telephone Encounter (Signed)
 Faxed confirmation received.

## 2024-09-19 ENCOUNTER — Encounter: Payer: Self-pay | Admitting: Adult Health

## 2024-09-21 ENCOUNTER — Ambulatory Visit (INDEPENDENT_AMBULATORY_CARE_PROVIDER_SITE_OTHER)

## 2024-09-21 ENCOUNTER — Ambulatory Visit: Admitting: Podiatry

## 2024-09-21 ENCOUNTER — Encounter: Payer: Self-pay | Admitting: Podiatry

## 2024-09-21 ENCOUNTER — Telehealth: Payer: Self-pay | Admitting: Podiatry

## 2024-09-21 DIAGNOSIS — M2041 Other hammer toe(s) (acquired), right foot: Secondary | ICD-10-CM | POA: Diagnosis not present

## 2024-09-21 DIAGNOSIS — M7750 Other enthesopathy of unspecified foot: Secondary | ICD-10-CM

## 2024-09-21 DIAGNOSIS — M2042 Other hammer toe(s) (acquired), left foot: Secondary | ICD-10-CM | POA: Diagnosis not present

## 2024-09-21 DIAGNOSIS — M7752 Other enthesopathy of left foot: Secondary | ICD-10-CM

## 2024-09-21 DIAGNOSIS — M7751 Other enthesopathy of right foot: Secondary | ICD-10-CM | POA: Diagnosis not present

## 2024-09-21 NOTE — Progress Notes (Signed)
 Subjective:   Patient ID: Joy Shelton Patient, female   DOB: 63 y.o.   MRN: [id number]   HPI Patient presents with chronic discomfort which has been present for a long time but worse over the last 6 months between the 4th and 5th digit on the right foot.  Patient states that she has tried wider shoes and other modalities with the left hurting but not to the same degree of the right and patient has done well had previous stroke stable just taking baby aspirin    ROS      Objective:  Physical Exam  Neurovascular status intact good digital perfusion with severe rotation digit 5 right over left with compression between the toes keratotic lesion distal medial fifth digit right and on the head of the proximal phalanx fourth digit right over left foot.  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  I reviewed consent form allowing her to go over complications alternative treatments and after review she signs understanding risk.  Patient understands total recovery period will take a number of months and she will be in surgical shoe for approximately 4 weeks.  Do not recommend correcting left at this time may be necessary in future  X-rays indicate there is severe rotation digit 5 right over left compression between the 4th and 5th toes

## 2024-09-21 NOTE — Telephone Encounter (Signed)
 Called and scheduled patient for surgery 10/03/2024. Patient is on aspirin  81 mg and is aware to discontinue 7 days prior to surgery. Patient not on any GLP1 medications. Patient pharmacy is correct in chart.

## 2024-09-26 ENCOUNTER — Telehealth: Payer: Self-pay | Admitting: Podiatry

## 2024-09-26 NOTE — Telephone Encounter (Signed)
 DOS- 10/03/2024  4TH AND 5TH HAMMERTOE REPAIR RT- 28285 5TH EXOSTECTOMY RT- 28108  [employer] EFFECTIVE DATE- 11/10/2023  DEDUCTIBLE- $7000 REMAINING- $3505.35 OOP- $9200 REMAINING- $2165.77 COINSURANCE- 50%  PER CARELON PORTAL, PRIOR AUTH FOR CPT 71714 (2 UNITS) HAS BEEN APPROVED FROM 10/03/2024-12/01/2024. AUTH# [id number] PER [employer] PORTAL, NO PRIOR AUTH IS REQUIRED FOR CPT CODE 71891. DOCUMENTATION ATTACHED TO SURGERY CONSENT PACKET.

## 2024-09-26 NOTE — Telephone Encounter (Signed)
 ERROR

## 2024-10-02 MED ORDER — HYDROCODONE-ACETAMINOPHEN 10-325 MG PO TABS: 1.0000 | ORAL_TABLET | Freq: Three times a day (TID) | ORAL | 0 refills | Status: AC | PRN

## 2024-10-02 NOTE — Addendum Note (Signed)
 Addended by: MAGDALEN PASCO RAMAN on: 10/02/2024 05:09 PM   Modules accepted: Orders

## 2024-10-03 DIAGNOSIS — M25774 Osteophyte, right foot: Secondary | ICD-10-CM | POA: Diagnosis not present

## 2024-10-03 DIAGNOSIS — G4731 Primary central sleep apnea: Secondary | ICD-10-CM | POA: Diagnosis not present

## 2024-10-03 DIAGNOSIS — M898X7 Other specified disorders of bone, ankle and foot: Secondary | ICD-10-CM | POA: Diagnosis not present

## 2024-10-03 DIAGNOSIS — M2041 Other hammer toe(s) (acquired), right foot: Secondary | ICD-10-CM | POA: Diagnosis not present

## 2024-10-09 ENCOUNTER — Encounter: Payer: Self-pay | Admitting: Podiatry

## 2024-10-09 ENCOUNTER — Ambulatory Visit

## 2024-10-09 ENCOUNTER — Ambulatory Visit: Admitting: Podiatry

## 2024-10-09 DIAGNOSIS — M2041 Other hammer toe(s) (acquired), right foot: Secondary | ICD-10-CM

## 2024-10-09 DIAGNOSIS — M2042 Other hammer toe(s) (acquired), left foot: Secondary | ICD-10-CM

## 2024-10-10 NOTE — Progress Notes (Signed)
 Subjective:   Patient ID: Joy Shelton Patient, female   DOB: 63 y.o.   MRN: [id number]   HPI Patient presents stating that she is doing well with surgery and is walking without discomfort   ROS      Objective:  Physical Exam  Neurovascular status intact negative Toula' sign noted wound edges coapted well 4th and 5th digit good alignment at this point     Assessment:  Doing well post arthroplasty digits 4 5 right exostectomy digit 5 right     Plan:  H&P reviewed and reapplied sterile dressing continue open toed shoes reappoint 2 weeks suture removal earlier if needed  X-rays indicate adequate resection of bone digits 4 5 good alignment noted

## 2024-10-23 ENCOUNTER — Ambulatory Visit (INDEPENDENT_AMBULATORY_CARE_PROVIDER_SITE_OTHER)

## 2024-10-23 ENCOUNTER — Ambulatory Visit (INDEPENDENT_AMBULATORY_CARE_PROVIDER_SITE_OTHER): Admitting: Podiatry

## 2024-10-23 DIAGNOSIS — M2041 Other hammer toe(s) (acquired), right foot: Secondary | ICD-10-CM | POA: Diagnosis not present

## 2024-10-23 NOTE — Progress Notes (Signed)
 Subjective:   Patient ID: Joy Shelton Patient, female   DOB: 63 y.o.   MRN: [id number]   HPI Patient seen by nurse for suture removal   ROS      Objective:  Physical Exam  Incision site looks good patient happy     Assessment:  Doing well post arthroplasty exostectomy     Plan:  X-rays show satisfactory resection and bone good position gradual return to shoe and instructed the patient of this with patient seen by nurse

## 2024-10-23 NOTE — Progress Notes (Signed)
 Patient presents for post-op visit today, POV #2 DOS 10/03/2024 RT 4TH AND 5TH HAMMER TOE REPAIR, RT 5TH EXOSTECTOMY  doing pretty good. A little bit of pain at night, mainly on the top. I have only taken ibuprofen at night if needed. .  RN Notes: n/a  Vital Signs: Today's Vitals   10/23/24 0845  PainSc: 0-No pain      Radiographs: [x]  Taken []  Not taken  Surgical Site Assessment:  - Dressing:  []  Minimal dry blood, intact []  Reinforced   []  Changed     -RN Notes: no dressing in place  - Incision:  [x]  CDI (clean, dry, intact)  []  Mild erythema  []  Drainage noted   -RN Notes: n/a  - Swelling:  []  None  [x]  Mild  []  Moderate   []  Significant     -RN Notes: n/a  - Bruising:  [x]  None  []  Present: n/a   - Sutures/Staples:  []  None [x]  Intact  [x]  Removed Today  []  Plan to remove at next visit   -Cast/Splint/Pins: [x]  None []  Intact []  Removed Today []  Plan to remove at next visit []  Replaced  -Signs of infection:  [x]  None  []  Present - Describe: n/a  -DME:    []  None []  AFW [x]  Surgical shoe []  Cast  []  Splint  -Walking status:  [x]  Full WB  []  Partial WB  []  NWB  -Utilizing device:  [x]  None []  Knee Scooter []  Crutches []  Wheelchair    DVT assessment:  [x]  Denies symptoms []  Chest pain/SOB []  Pain in calf/redness/warmth   Redressed DSD and ace wrap. Educated on signs of infection, proper dressing care, pain management, and weight bearing status. Patient will contact provider with any new or worsening symptoms. The provider assessed the patient today and reviewed instructions regarding plan of care.

## 2025-05-22 ENCOUNTER — Ambulatory Visit: Admitting: Adult Health
